# Patient Record
Sex: Male | Born: 1945
Health system: Southern US, Community
[De-identification: ages and names within clinical notes are randomized; demographics above are authoritative.]

## PROBLEM LIST (undated history)

## (undated) DIAGNOSIS — M1990 Unspecified osteoarthritis, unspecified site: Secondary | ICD-10-CM

## (undated) DIAGNOSIS — N189 Chronic kidney disease, unspecified: Secondary | ICD-10-CM

## (undated) DIAGNOSIS — M5410 Radiculopathy, site unspecified: Secondary | ICD-10-CM

## (undated) DIAGNOSIS — E785 Hyperlipidemia, unspecified: Secondary | ICD-10-CM

## (undated) DIAGNOSIS — Z87442 Personal history of urinary calculi: Secondary | ICD-10-CM

## (undated) DIAGNOSIS — K219 Gastro-esophageal reflux disease without esophagitis: Secondary | ICD-10-CM

## (undated) DIAGNOSIS — R7303 Prediabetes: Secondary | ICD-10-CM

## (undated) DIAGNOSIS — N529 Male erectile dysfunction, unspecified: Secondary | ICD-10-CM

## (undated) DIAGNOSIS — I499 Cardiac arrhythmia, unspecified: Secondary | ICD-10-CM

## (undated) DIAGNOSIS — E559 Vitamin D deficiency, unspecified: Secondary | ICD-10-CM

## (undated) DIAGNOSIS — I10 Essential (primary) hypertension: Secondary | ICD-10-CM

## (undated) DIAGNOSIS — I5189 Other ill-defined heart diseases: Secondary | ICD-10-CM

## (undated) HISTORY — PX: COLONOSCOPY: SHX174

## (undated) HISTORY — PX: UPPER GASTROINTESTINAL ENDOSCOPY: SHX188

## (undated) HISTORY — DX: Gastro-esophageal reflux disease without esophagitis: K21.9

## (undated) HISTORY — DX: Chronic kidney disease, unspecified: N18.9

## (undated) HISTORY — DX: Male erectile dysfunction, unspecified: N52.9

## (undated) HISTORY — DX: Vitamin D deficiency, unspecified: E55.9

## (undated) HISTORY — DX: Hyperlipidemia, unspecified: E78.5

## (undated) HISTORY — DX: Essential (primary) hypertension: I10

---
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
## 1998-11-21 ENCOUNTER — Emergency Department (HOSPITAL_COMMUNITY): Admission: EM | Admit: 1998-11-21 | Discharge: 1998-11-21 | Payer: Self-pay | Admitting: Emergency Medicine

## 1998-11-21 ENCOUNTER — Encounter: Payer: Self-pay | Admitting: Emergency Medicine

## 2000-01-31 ENCOUNTER — Encounter: Payer: Self-pay | Admitting: Gastroenterology

## 2006-03-22 ENCOUNTER — Ambulatory Visit: Payer: Self-pay | Admitting: Gastroenterology

## 2006-04-07 ENCOUNTER — Encounter (INDEPENDENT_AMBULATORY_CARE_PROVIDER_SITE_OTHER): Payer: Self-pay | Admitting: *Deleted

## 2006-04-07 ENCOUNTER — Ambulatory Visit: Payer: Self-pay | Admitting: Gastroenterology

## 2009-08-21 ENCOUNTER — Emergency Department (HOSPITAL_COMMUNITY): Admission: EM | Admit: 2009-08-21 | Discharge: 2009-08-22 | Payer: Self-pay | Admitting: Emergency Medicine

## 2009-08-22 ENCOUNTER — Encounter (INDEPENDENT_AMBULATORY_CARE_PROVIDER_SITE_OTHER): Payer: Self-pay | Admitting: *Deleted

## 2009-08-25 ENCOUNTER — Encounter: Admission: RE | Admit: 2009-08-25 | Discharge: 2009-08-25 | Payer: Self-pay | Admitting: General Surgery

## 2009-08-25 ENCOUNTER — Encounter (INDEPENDENT_AMBULATORY_CARE_PROVIDER_SITE_OTHER): Payer: Self-pay | Admitting: *Deleted

## 2009-08-25 ENCOUNTER — Encounter: Payer: Self-pay | Admitting: Gastroenterology

## 2009-08-27 ENCOUNTER — Telehealth: Payer: Self-pay | Admitting: Gastroenterology

## 2009-08-28 ENCOUNTER — Ambulatory Visit: Payer: Self-pay | Admitting: Internal Medicine

## 2009-08-28 ENCOUNTER — Encounter: Payer: Self-pay | Admitting: Internal Medicine

## 2009-08-28 DIAGNOSIS — Z86010 Personal history of colonic polyps: Secondary | ICD-10-CM

## 2009-08-31 ENCOUNTER — Encounter: Payer: Self-pay | Admitting: Internal Medicine

## 2009-08-31 ENCOUNTER — Ambulatory Visit: Payer: Self-pay | Admitting: Internal Medicine

## 2009-08-31 ENCOUNTER — Ambulatory Visit (HOSPITAL_COMMUNITY): Admission: RE | Admit: 2009-08-31 | Discharge: 2009-08-31 | Payer: Self-pay | Admitting: Internal Medicine

## 2009-09-01 ENCOUNTER — Encounter: Payer: Self-pay | Admitting: Internal Medicine

## 2009-09-01 ENCOUNTER — Ambulatory Visit (HOSPITAL_COMMUNITY): Admission: RE | Admit: 2009-09-01 | Discharge: 2009-09-01 | Payer: Self-pay | Admitting: Internal Medicine

## 2009-09-02 ENCOUNTER — Telehealth: Payer: Self-pay | Admitting: Internal Medicine

## 2009-09-17 ENCOUNTER — Ambulatory Visit (HOSPITAL_COMMUNITY): Admission: RE | Admit: 2009-09-17 | Discharge: 2009-09-17 | Payer: Self-pay | Admitting: Internal Medicine

## 2009-09-30 ENCOUNTER — Encounter: Payer: Self-pay | Admitting: Gastroenterology

## 2009-10-06 ENCOUNTER — Encounter (INDEPENDENT_AMBULATORY_CARE_PROVIDER_SITE_OTHER): Payer: Self-pay | Admitting: General Surgery

## 2009-10-06 ENCOUNTER — Ambulatory Visit (HOSPITAL_COMMUNITY): Admission: RE | Admit: 2009-10-06 | Discharge: 2009-10-07 | Payer: Self-pay | Admitting: General Surgery

## 2009-10-09 ENCOUNTER — Encounter: Admission: RE | Admit: 2009-10-09 | Discharge: 2009-10-09 | Payer: Self-pay | Admitting: General Surgery

## 2010-01-03 HISTORY — PX: HAMMER TOE SURGERY: SHX385

## 2010-01-24 ENCOUNTER — Encounter: Payer: Self-pay | Admitting: Internal Medicine

## 2010-02-02 NOTE — Progress Notes (Signed)
Summary: results request  Phone Note Call from Patient Call back at [phone]  or  [phone]   Caller: Larita Fife, wife Call For: Dr. Juanda Chance  Reason for Call: Lab or Test Results Summary of Call: would like EGD and MRI results Initial call taken by: Vallarie Mare,  September 02, 2009 11:09 AM  Follow-up for Phone Call        Left message for patient to call back Darcey Nora RN, [facility]  September 02, 2009 11:18 AM  reviewed with patient's wife.  See MRI append. Darcey Nora RN, [facility]  September 02, 2009 12:03 PM

## 2010-02-02 NOTE — Procedures (Signed)
Summary: Upper Endoscopy  Patient: Jeffrey Carey Note: All result statuses are Final unless otherwise noted.  Tests: (1) Upper Endoscopy (EGD)   EGD Upper Endoscopy       DONE     [facility]     [address] [place], [place]  [address]           ENDOSCOPY PROCEDURE REPORT           PATIENT:  Jeffrey Carey, Jeffrey Carey  MR#:  [id number]     BIRTHDATE:  [birth year], 64 yrs. old  GENDER:  male           ENDOSCOPIST:  Hedwig Morton. Juanda Chance, MD     Referred by:  Lucky Cowboy, M.D.           PROCEDURE DATE:  08/31/2009     PROCEDURE:  EGD with biopsy     ASA CLASS:  Class II     INDICATIONS:  abdominal pain bloating and dyspepsia x several     weeks, started suddenly after a meal,     cholelithioasis on sono     normal LFT's, small liver lesions on CT scan 08/25/2009,scheduled     for MRI     weight loss 10 lbs and loss of apetite           MEDICATIONS:   Versed 4 mg, Fentanyl 50 mcg     TOPICAL ANESTHETIC:  Cetacaine Spray           DESCRIPTION OF PROCEDURE:   After the risks benefits and     alternatives of the procedure were thoroughly explained, informed     consent was obtained.  The  endoscope was introduced through the     mouth and advanced to the second portion of the duodenum, without     limitations.  The instrument was slowly withdrawn as the mucosa     was fully examined.     <<PROCEDUREIMAGES>>           The upper, middle, and distal third of the esophagus were     carefully inspected and no abnormalities were noted. The z-line     was well seen at the GEJ. The endoscope was pushed into the fundus     which was normal including a retroflexed view. The antrum,gastric     body, first and second part of the duodenum were unremarkable. 1     cm reducible hiatal hernia With standard forceps, a biopsy was     obtained and sent to pathology. gastric biopsy to r/o H (see     image1, image2, image3, image4, image5, and image6).Pylori     Retroflexed views revealed no  abnormalities.    The scope was then     withdrawn from the patient and the procedure completed.           COMPLICATIONS:  None           ENDOSCOPIC IMPRESSION:     1) Normal EGD     s/p gastric biopsies to r/o H.Pylori, nothing to account for     pt's symptoms     RECOMMENDATIONS:     1) Await biopsy results     MRI of the liver scheduled     consider HIDA scan to assess gall bladder function     continue PPI bid     low fat diet     follow up with Dr Arlyce Dice in 2 weeks  REPEAT EXAM:  In 0 year(s) for.           ______________________________     Hedwig Morton. Juanda Chance, MD           CC:           n.     eSIGNED:   Hedwig Morton. Allysson Rinehimer at 08/31/2009 09:39 AM           Elliot Cousin, [id number]  Note: An exclamation mark (!) indicates a result that was not dispersed into the flowsheet. Document Creation Date: 08/31/2009 9:40 AM _______________________________________________________________________  (1) Order result status: Final Collection or observation date-time: 08/31/2009 09:30 Requested date-time:  Receipt date-time:  Reported date-time:  Referring Physician:   Ordering Physician: Lina Sar [phone]) Specimen Source:  Source: Launa Grill Order Number: [id number] Lab site:

## 2010-02-02 NOTE — Letter (Signed)
Summary: [facility]   Imported By: Lester [place] 10/09/2009 09:32:19  _____________________________________________________________________  External Attachment:    Type:   Image     Comment:   External Document

## 2010-02-02 NOTE — Progress Notes (Signed)
Summary: triage  Phone Note From Other Clinic Call back at [phone]   Caller: Elease Hashimoto, assistant Call For: Dr. Arlyce Dice Reason for Call: Schedule Patient Appt Summary of Call: Dr. Emelia Loron would like pt seen asap for diarrhea and abd bloating Initial call taken by: Vallarie Mare,  August 27, 2009 8:41 AM  Follow-up for Phone Call        Pt. will see [facility] on 08-28-09 at 1:30pm. Elease Hashimoto will advise pt. of appt/med.list/co-pay/cx.policy. She will fax records to [facility]. Follow-up by: Laureen Ochs LPN,  August 27, 2009 9:05 AM

## 2010-02-02 NOTE — Letter (Signed)
Summary: [facility]   Imported By: Lester  09/15/2009 09:48:12  _____________________________________________________________________  External Attachment:    Type:   Image     Comment:   External Document

## 2010-02-02 NOTE — Letter (Signed)
Summary: EGD Instructions  [facility]  [address] [place], [place] [address]   Phone: [phone]  Fax: [phone]       Jeffrey Carey    May 10, 1945    MRN: [id number]       Procedure Day /Date:08-31-09     Arrival Time: 8:30 AM      Procedure Time: 9:30 AM     Location of Procedure:                     X     [facility] ( Outpatient Registration)  PREPARATION FOR ENDOSCOPY   On 8-29-11THE DAY OF THE PROCEDURE:  1.   No solid foods, milk or milk products are allowed after midnight the night before your procedure.  2.   Do not drink anything colored red or purple.  Avoid juices with pulp.  No orange juice.  3.  You may drink clear liquids until  5:30 AM , which is 4  hours before your procedure.                                                                                                CLEAR LIQUIDS INCLUDE: Water Jello Ice Popsicles Tea (sugar ok, no milk/cream) Powdered fruit flavored drinks Coffee (sugar ok, no milk/cream) Gatorade Juice: apple, white grape, white cranberry  Lemonade Clear bullion, consomm, broth Carbonated beverages (any kind) Strained chicken noodle soup Hard Candy   MEDICATION INSTRUCTIONS  Unless otherwise instructed, you should take regular prescription medications with a small sip of water as early as possible the morning of your procedure.        OTHER INSTRUCTIONS  You will need a responsible adult at least 64 years of age to accompany you and drive you home.   This person must remain in the waiting room during your procedure.  Wear loose fitting clothing that is easily removed.  Leave jewelry and other valuables at home.  However, you may wish to bring a book to read or an iPod/MP3 player to listen to music as you wait for your procedure to start.  Remove all body piercing jewelry and leave at home.  Total time from sign-in until discharge is approximately 2-3 hours.  You should go home directly  after your procedure and rest.  You can resume normal activities the day after your procedure.  The day of your procedure you should not:   Drive   Make legal decisions   Operate machinery   Drink alcohol   Return to work  You will receive specific instructions about eating, activities and medications before you leave.    The above instructions have been reviewed and explained to me by   _______________________    I fully understand and can verbalize these instructions _____________________________ Date _________

## 2010-02-02 NOTE — Letter (Signed)
Summary: Patient Notice-Endo Biopsy Results  [facility]  [address] [place], [place] [address]   Phone: [phone]  Fax: [phone]        September 01, 2009 MRN: [id number]    Jeffrey Carey [address] [place], [place]  [address]    Dear Mr. DETTER,  I am pleased to inform you that the biopsies taken during your recent endoscopic examination did not show any evidence of cancer upon pathologic examination.The biopsies show normal tissue  Additional information/recommendations:  __No further action is needed at this time.  Please follow-up with      your primary care physician for your other healthcare needs.  _x_ Please call [phone] to schedule a return visit to review      your condition to follow up with Dr Arlyce Dice  _x_ Continue with the treatment plan as outlined on the day of your      exam.The MRI showed  the liver lesions to be simple cysts . Also gall stones showed again in Your gall bladder.    Please call us if you are having persistent problems or have questions about your condition that have not been fully answered at this time.  Sincerely,  Hart Carwin MD  This letter has been electronically signed by your physician.  Appended Document: Patient Notice-Endo Biopsy Results letter mailed to patient's home

## 2010-02-02 NOTE — Procedures (Signed)
Summary: Colonoscopy   Colonoscopy  Procedure date:  04/07/2006  Findings:      Location:  [facility].     Patient Name: Jeffrey Carey, Jeffrey Carey MRN:  Procedure Procedures: Colonoscopy CPT: [address].    with Hot Biopsy(s)CPT: [id number].    with polypectomy. CPT: A3573898.  Personnel: Endoscopist: Barbette Hair. Arlyce Dice, MD.  Patient Consent: Procedure, Alternatives, Risks and Benefits discussed, consent obtained, from patient.  Indications  Average Risk Screening Routine.  History  Current Medications: Patient is not currently taking Coumadin.  Pre-Exam Physical: Performed Apr 07, 2006. Cardio-pulmonary exam, HEENT exam , Abdominal exam, Mental status exam WNL.  Comments: Patient history reviewed/updated, physical performed prior to initiation of sedation?yes Exam Exam: Extent of exam reached: Cecum, extent intended: Cecum.  The cecum was identified by appendiceal orifice and IC valve. Time to Cecum: 00:01: 40. Time for Withdrawl: 00:07:58. Colon retroflexion performed. ASA Classification: I. Tolerance: good.  Monitoring: Pulse and BP monitoring, Oximetry used. Supplemental O2 given. at 2 Liters.  Colon Prep Used Miralax for colon prep. Prep results: good.  Sedation Meds: Patient assessed and found to be appropriate for moderate (conscious) sedation. Sedation was managed by the Endoscopist. Fentanyl 50 mcg. given IV. Versed 6 mg. given IV.  Findings - NORMAL EXAM: Ascending Colon to Sigmoid Colon.  POLYP: Ascending Colon, Maximum size: 7 mm. sessile polyp. Procedure:  snare with cautery, Polyp sent to pathology. ICD9: Colon Polyps: 211.3.  NORMAL EXAM: Cecum.  - MULTIPLE POLYPS: Sigmoid Colon. minimum size 1 mm, maximum size 2 mm. Procedure:  hot biopsy, 1 polyps Comments: 5 discreet diminutive polyps at 12-13cm from anus, most likely hyperplastic.  NORMAL EXAM: Rectum.   Assessment Abnormal examination, see findings above.  Diagnoses: 211.3: Colon Polyps.    Events  Unplanned Interventions: No intervention was required.  Unplanned Events: There were no complications. Plans  Post Exam Instructions: Post sedation instructions given.  Patient Education: Patient given standard instructions for: Polyps.  Disposition: After procedure patient sent to recovery. After recovery patient sent home.  Scheduling/Referral: Colonoscopy, to Barbette Hair. Arlyce Dice, MD, around Apr 07, 2011.    CC: [name]     The Patient   This report was created from the original endoscopy report, which was reviewed and signed by the above listed endoscopist.

## 2010-02-02 NOTE — Assessment & Plan Note (Signed)
Summary: DIARRHEA, PAIN,BLOATING    (DR.KAPLAN PT.)   DEBORAH   History of Present Illness Visit Type: Follow-up Visit Primary GI MD: Melvia Heaps MD [facility] Primary Provider: Karle Plumber Requesting Provider: n/a Chief Complaint: Bloating and abdominal discomfort History of Present Illness:   PLEASANT 65 Y.O MALE KNOWN TO DR. KAPLAN FROM PREVIOUS COLONOSCOPIES.LAST SEEN 2008/ADENOMATOUS POLYPS. HE IS REFERRED BACK TODAY FOR EVALUATION OF 2 WEEK HX OF UPPER ABDOMINAL PRESSURE AND BLOATING WHICH IS MAKING HIM MISERABLE. HE HAS NO APPETITE AND HAS LOST 10 POUNDS OVER THE PAST COUPLE WEEKS.NO NAUSEA OR VOMITING,FEELS WORSE AFTER EATING. NO FEVER. NO HEARTBURN,DYSPHAGIA. BM'S DECREASED SINCE NOT EATING, NO MELENA OR HEME. HE STARTED PRILOSEC TWICE DAILY LAST WEEK-NO CHANGE SO FAR. HE RELATES A SIMILAR FEELING ABOUT 10 YEARS AGO,WAS TREATED FOR A BACTERIA IN HIS STOMACH AND  SXS RESOVED. NO ASA OR NSAID USE.  LABS 8/20  UNREMARKABLE CBC,CMET,LIPASE. HE HAD A CT ABD/PELVIS 8/23  WHICH SHOWS SMALL INDETERMINATE LIVER LESIONS,LARGEST 1.5 CM,MILDLY ENARLGED PROSTATE. ABDOMINAL US 8/20 SHOWS MULTIPLE GALLSTONES. HE SAW DR WAKEFIELD FOR SURGICAL CONSULT EARLIER THIS WEEK,AND WAS TOLD SXS NOT DUE TO GB.  PT IS UNABLE TO SLEEP BECAUSE HE FEELS WORSE WHEN LAYING DOWN, "FEELS PRESSURE PUSHING UP",HAS BEEN TRYING TO SLEEP IN A RECLINER.   GI Review of Systems    Reports abdominal pain, belching, and  bloating.      Denies acid reflux, chest pain, dysphagia with liquids, dysphagia with solids, heartburn, loss of appetite, nausea, vomiting, vomiting blood, weight loss, and  weight gain.      Reports diarrhea.     Denies anal fissure, black tarry stools, change in bowel habit, constipation, diverticulosis, fecal incontinence, heme positive stool, hemorrhoids, irritable bowel syndrome, jaundice, light color stool, liver problems, rectal bleeding, and  rectal pain. Preventive Screening-Counseling &  Management  Alcohol-Tobacco     Smoking Status: never      Drug Use:  no.      Current Medications (verified): 1)  Prilosec 40 Mg Cpdr (Omeprazole) .Marland Kitchen.. 1 By Mouth Two Times A Day 2)  Gas Relief 80 Mg Chew (Simethicone) .... As Needed  Allergies (verified): No Known Drug Allergies  Past History:  Past Medical History: Hyperlipidemia Hypertension Tubular Adenoma colon 2008  Past Surgical History: foot surgery  Family History: Family History of Colon Cancer:mother, father Family History of Diabetes: father  Social History: Patient has never smoked.  Alcohol Use - no Daily Caffeine Use  sodas and coffee Illicit Drug Use - no Smoking Status:  never Drug Use:  no  Review of Systems       The patient complains of shortness of breath and sleeping problems.  The patient denies allergy/sinus, anemia, anxiety-new, arthritis/joint pain, back pain, blood in urine, breast changes/lumps, change in vision, confusion, cough, coughing up blood, depression-new, fainting, fatigue, fever, headaches-new, hearing problems, heart murmur, heart rhythm changes, itching, muscle pains/cramps, night sweats, nosebleeds, skin rash, sore throat, swelling of feet/legs, swollen lymph glands, thirst - excessive, urination - excessive, urination changes/pain, urine leakage, vision changes, and voice change.         SEE HPI  Vital Signs:  Patient profile:   65 year old male Height:      71 inches Weight:      204 pounds BMI:     28.56 BSA:     2.13 Pulse rate:   72 / minute Pulse rhythm:   regular BP sitting:   118 / 80  (left arm)  Vitals Entered By: Merri Ray CMA Duncan Dull) (August 28, 2009 1:18 PM)  Physical Exam  General:  Well developed, well nourished, no acute distress. Head:  Normocephalic and atraumatic. Eyes:  PERRLA, no icterus. Neck:  Supple; no masses or thyromegaly. Lungs:  Clear throughout to auscultation. Heart:  Regular rate and rhythm; no murmurs, rubs,  or  bruits. Abdomen:  SOFT, BS+,NO SPLASH, NO PALP MASS OR HSM, MINIMAL DISCOMFORT EPIGASTRIUM Rectal:  HEME NEGATIVE Extremities:  No clubbing, cyanosis, edema or deformities noted. Neurologic:  Alert and  oriented x4;  grossly normal neurologically. Psych:  Alert and cooperative. Normal mood and affect.   Impression & Recommendations:  Problem # 1:  ABDOMINAL PAIN, EPIGASTRIC (ICD-789.06) Assessment New 64 YO MALE WITH 2 WEEK HX OF EPIGASTRIC DISCOMFORT, BLOATING,PRESSURE WITH ANOREXIA AND 10 POUND WEIGHT LOSS. CHOLELITHIASIS ON [place] BUT SURGEON DID NOT FEEL  SXS CONSISTENT WITH GB DISEASE. INDETERMINATE LIVER LESIONS ON CT -WILL NEED FURTHER WORKUP.  R/O GASTRIC LESION/NEOPLASM ,PUD,HPYLORI.    CONTINUE TWICE DAILY PRILOSEC FOR NOW SCHEDULE FOR UPPER ENDOSCOPY NEXT WEEK WITH DR. Juanda Chance TO EXPEDITE WORKUP. SCHEDULE FOR MRI OF LIVER  SMALL FREQUENT MEALS.   Orders: ZEGD (ZEGD)  Problem # 2:  FAMILY HX COLON CANCER (ICD-V16.0) Assessment: Comment Only  Problem # 3:  PERSONAL HX COLONIC POLYPS (ICD-V12.72) Assessment: Comment Only UP TO DATE,LAST COLON 2008,DUE FOR FOLLOW UP 2013  Patient Instructions: 1)  We have scheduled the Endoscopy at [facility] with Dr. Stormy Card 8/29. 2)  Directions and brochure provided. 3)  We have scheduled the MRI of the Liver on 09-01-09 .  4)  Take Prilosec twice daily, 40 min prior to breakfast and dinner.  Do not take the Prilosec  the morning of the procedure. 5)  Copy: Lucky Cowboy. 6)  The medication list was reviewed and reconciled.  All changed / newly prescribed medications were explained.  A complete medication list was provided to the patient / caregiver.

## 2010-02-02 NOTE — Procedures (Signed)
Summary: Colonoscopy   Colonoscopy  Procedure date:  01/31/2000  Findings:      Location:  [facility].     Patient Name: Jeffrey Carey, Jeffrey Carey MRN:  Procedure Procedures: Colorectal cancer screening, average risk CPT: G0121.  Personnel: Endoscopist: Barbette Hair. Arlyce Dice, MD.  Referred By: Marinus Maw, MD.  Exam Location: Exam performed in Outpatient Clinic. Outpatient  Patient Consent: Procedure, Alternatives, Risks and Benefits discussed, consent obtained, from patient.  Indications  Average Risk Screening Routine.  History  Pre-Exam Physical: Performed Jan 31, 2000. Cardio-pulmonary exam, Rectal exam, HEENT exam , Abdominal exam, Extremity exam WNL.  Exam Exam: Extent of exam reached: Transverse Colon, extent intended: Descending Colon.  ASA Classification: I.  Monitoring: Pulse and BP monitoring, Oximetry used. Supplemental O2 given.  Colon Prep Used Fleets enema for colon prep. Prep results: good.  Sedation Meds: Fentanyl Versed  Findings - NORMAL EXAM: Transverse Colon to Rectum.   Assessment Normal examination.  Events  Unplanned Interventions: No intervention was required.  Unplanned Events: There were no complications. Plans Disposition: After procedure patient sent to recovery. After recovery patient sent home.  Scheduling/Referral: Colonoscopy, to Barbette Hair. Arlyce Dice, MD, around Jan 30, 2005.   Comments: t/c full screening colonoscopy in 5 years   CC: Chrissie Noa D.McKeown,MD     This report was created from the original endoscopy report, which was reviewed and signed by the above listed endoscopist.

## 2010-02-02 NOTE — Miscellaneous (Signed)
Summary: MRI order  Clinical Lists Changes  Problems: Added new problem of GASTROINTESTINAL XRAY, ABNORMAL (ICD-793.4) Orders: Added new Referral order of MRI Liver (MRI Liver) - Signed

## 2010-02-02 NOTE — Procedures (Signed)
Summary: Instruction for procedure/[place]  Instruction for procedure/[place]   Imported By: Sherian Rein 09/02/2009 10:39:48  _____________________________________________________________________  External Attachment:    Type:   Image     Comment:   External Document

## 2010-02-03 ENCOUNTER — Encounter: Payer: Self-pay | Admitting: Internal Medicine

## 2010-03-04 LAB — HM COLONOSCOPY

## 2010-03-18 LAB — SURGICAL PCR SCREEN: MRSA, PCR: NEGATIVE

## 2010-03-18 LAB — CBC
HCT: 49.9 % (ref 39.0–52.0)
Hemoglobin: 16.6 g/dL (ref 13.0–17.0)
MCH: 29.3 pg (ref 26.0–34.0)
MCV: 88 fL (ref 78.0–100.0)
RBC: 5.67 MIL/uL (ref 4.22–5.81)
WBC: 9.1 10*3/uL (ref 4.0–10.5)

## 2010-03-18 LAB — DIFFERENTIAL
Basophils Absolute: 0 10*3/uL (ref 0.0–0.1)
Basophils Relative: 0 % (ref 0–1)
Eosinophils Absolute: 0.2 10*3/uL (ref 0.0–0.7)
Eosinophils Relative: 2 % (ref 0–5)
Lymphs Abs: 1.3 10*3/uL (ref 0.7–4.0)
Monocytes Absolute: 0.4 10*3/uL (ref 0.1–1.0)
Monocytes Relative: 4 % (ref 3–12)

## 2010-03-18 LAB — COMPREHENSIVE METABOLIC PANEL
Albumin: 3.7 g/dL (ref 3.5–5.2)
Calcium: 9.4 mg/dL (ref 8.4–10.5)
Chloride: 104 mEq/L (ref 96–112)
Creatinine, Ser: 1.35 mg/dL (ref 0.4–1.5)
GFR calc Af Amer: >60 mL/min (ref >60)
GFR calc non Af Amer: 53 mL/min — ABNORMAL LOW (ref >60)
Potassium: 4.9 mEq/L (ref 3.5–5.1)
Total Bilirubin: 0.6 mg/dL (ref 0.3–1.2)

## 2010-03-19 LAB — DIFFERENTIAL
Basophils Absolute: 0 10*3/uL (ref 0.0–0.1)
Eosinophils Absolute: 0.2 10*3/uL (ref 0.0–0.7)
Lymphocytes Relative: 17 % (ref 12–46)
Lymphs Abs: 1.6 10*3/uL (ref 0.7–4.0)
Monocytes Absolute: 0.7 10*3/uL (ref 0.1–1.0)
Neutro Abs: 6.6 10*3/uL (ref 1.7–7.7)

## 2010-03-19 LAB — COMPREHENSIVE METABOLIC PANEL
ALT: 19 U/L (ref 0–53)
AST: 22 U/L (ref 0–37)
CO2: 26 mEq/L (ref 19–32)
Glucose, Bld: 84 mg/dL (ref 70–99)
Total Bilirubin: 1.2 mg/dL (ref 0.3–1.2)
Total Protein: 6.7 g/dL (ref 6.0–8.3)

## 2010-03-19 LAB — CK TOTAL AND CKMB (NOT AT ARMC)
CK, MB: 6 ng/mL — ABNORMAL HIGH (ref 0.3–4.0)
Total CK: 163 U/L (ref 7–232)

## 2010-03-19 LAB — CBC
MCV: 90.6 fL (ref 78.0–100.0)
RDW: 14.8 % (ref 11.5–15.5)

## 2010-10-04 HISTORY — PX: LAPAROSCOPIC CHOLECYSTECTOMY: SUR755

## 2010-11-22 ENCOUNTER — Ambulatory Visit (HOSPITAL_COMMUNITY)
Admission: RE | Admit: 2010-11-22 | Discharge: 2010-11-22 | Disposition: A | Discharge status: Home / Self Care | Payer: 59 | Source: Ambulatory Visit | Attending: Internal Medicine | Admitting: Internal Medicine

## 2010-11-22 ENCOUNTER — Other Ambulatory Visit (HOSPITAL_COMMUNITY): Payer: Self-pay | Admitting: Internal Medicine

## 2010-11-22 DIAGNOSIS — N200 Calculus of kidney: Secondary | ICD-10-CM | POA: Insufficient documentation

## 2010-11-22 DIAGNOSIS — R1011 Right upper quadrant pain: Secondary | ICD-10-CM

## 2010-11-23 ENCOUNTER — Other Ambulatory Visit: Payer: Self-pay | Admitting: Internal Medicine

## 2010-11-23 DIAGNOSIS — R1011 Right upper quadrant pain: Secondary | ICD-10-CM

## 2010-11-24 ENCOUNTER — Ambulatory Visit
Admission: RE | Admit: 2010-11-24 | Discharge: 2010-11-24 | Disposition: A | Discharge status: Home / Self Care | Payer: 59 | Source: Ambulatory Visit | Attending: Internal Medicine | Admitting: Internal Medicine

## 2010-11-24 DIAGNOSIS — R1011 Right upper quadrant pain: Secondary | ICD-10-CM

## 2011-03-02 ENCOUNTER — Encounter: Payer: Self-pay | Admitting: Gastroenterology

## 2011-03-11 ENCOUNTER — Ambulatory Visit (AMBULATORY_SURGERY_CENTER): Payer: 59 | Admitting: *Deleted

## 2011-03-11 ENCOUNTER — Encounter: Payer: Self-pay | Admitting: Gastroenterology

## 2011-03-11 VITALS — Ht 71.0 in | Wt 206.0 lb

## 2011-03-11 DIAGNOSIS — Z1211 Encounter for screening for malignant neoplasm of colon: Secondary | ICD-10-CM

## 2011-03-11 MED ORDER — PEG-KCL-NACL-NASULF-NA ASC-C 100 G PO SOLR: ORAL | Status: DC

## 2011-03-25 ENCOUNTER — Ambulatory Visit (AMBULATORY_SURGERY_CENTER): Payer: 59 | Admitting: Gastroenterology

## 2011-03-25 ENCOUNTER — Encounter: Payer: Self-pay | Admitting: Gastroenterology

## 2011-03-25 VITALS — BP 136/109 | HR 64 | Temp 98.1°F | Resp 20 | Ht 71.0 in | Wt 206.0 lb

## 2011-03-25 DIAGNOSIS — D126 Benign neoplasm of colon, unspecified: Secondary | ICD-10-CM

## 2011-03-25 DIAGNOSIS — Z860100 Personal history of colon polyps, unspecified: Secondary | ICD-10-CM

## 2011-03-25 DIAGNOSIS — Z1211 Encounter for screening for malignant neoplasm of colon: Secondary | ICD-10-CM

## 2011-03-25 MED ORDER — SODIUM CHLORIDE 0.9 % IV SOLN: 500.0000 mL | INTRAVENOUS | Status: DC

## 2011-03-25 NOTE — Progress Notes (Signed)
Propofol administered by Paulita Cradle CRNA

## 2011-03-25 NOTE — Progress Notes (Signed)
Patient did not experience any of the following events: a burn prior to discharge; a fall within the facility; wrong site/side/patient/procedure/implant event; or a hospital transfer or hospital admission upon discharge from the facility. (G8907) Patient did not have preoperative order for IV antibiotic SSI prophylaxis. (G8918)  

## 2011-03-25 NOTE — Patient Instructions (Signed)
Discharge instructions given with  Verbal understanding. Handout on polyps given. Resume previous medications.YOU HAD AN ENDOSCOPIC PROCEDURE TODAY AT THE [facility]: Refer to the procedure report that was given to you for any specific questions about what was found during the examination.  If the procedure report does not answer your questions, please call your gastroenterologist to clarify.  If you requested that your care partner not be given the details of your procedure findings, then the procedure report has been included in a sealed envelope for you to review at your convenience later.  YOU SHOULD EXPECT: Some feelings of bloating in the abdomen. Passage of more gas than usual.  Walking can help get rid of the air that was put into your GI tract during the procedure and reduce the bloating. If you had a lower endoscopy (such as a colonoscopy or flexible sigmoidoscopy) you may notice spotting of blood in your stool or on the toilet paper. If you underwent a bowel prep for your procedure, then you may not have a normal bowel movement for a few days.  DIET: Your first meal following the procedure should be a light meal and then it is ok to progress to your normal diet.  A half-sandwich or bowl of soup is an example of a good first meal.  Heavy or fried foods are harder to digest and may make you feel nauseous or bloated.  Likewise meals heavy in dairy and vegetables can cause extra gas to form and this can also increase the bloating.  Drink plenty of fluids but you should avoid alcoholic beverages for 24 hours.  ACTIVITY: Your care partner should take you home directly after the procedure.  You should plan to take it easy, moving slowly for the rest of the day.  You can resume normal activity the day after the procedure however you should NOT DRIVE or use heavy machinery for 24 hours (because of the sedation medicines used during the test).    SYMPTOMS TO REPORT IMMEDIATELY: A  gastroenterologist can be reached at any hour.  During normal business hours, 8:30 AM to 5:00 PM Monday through Friday, call [phone].  After hours and on weekends, please call the GI answering service at [phone] who will take a message and have the physician on call contact you.   Following lower endoscopy (colonoscopy or flexible sigmoidoscopy):  Excessive amounts of blood in the stool  Significant tenderness or worsening of abdominal pains  Swelling of the abdomen that is new, acute  Fever of 100F or higher  FOLLOW UP: If any biopsies were taken you will be contacted by phone or by letter within the next 1-3 weeks.  Call your gastroenterologist if you have not heard about the biopsies in 3 weeks.  Our staff will call the home number listed on your records the next business day following your procedure to check on you and address any questions or concerns that you may have at that time regarding the information given to you following your procedure. This is a courtesy call and so if there is no answer at the home number and we have not heard from you through the emergency physician on call, we will assume that you have returned to your regular daily activities without incident.  SIGNATURES/CONFIDENTIALITY: You and/or your care partner have signed paperwork which will be entered into your electronic medical record.  These signatures attest to the fact that that the information above on your After Visit Summary has  been reviewed and is understood.  Full responsibility of the confidentiality of this discharge information lies with you and/or your care-partner.

## 2011-03-25 NOTE — Op Note (Signed)
[facility] [address] [place], [place]  [address]  COLONOSCOPY PROCEDURE REPORT  PATIENT:  Jeffrey Carey, Jeffrey Carey  MR#:  [id number] BIRTHDATE:  1945-04-03, 65 yrs. old  GENDER:  male ENDOSCOPIST:  Barbette Hair. Arlyce Dice, MD REF. BY:  Lucky Cowboy, M.D. PROCEDURE DATE:  03/25/2011 PROCEDURE:  Colonoscopy with snare polypectomy ASA CLASS:  Class II INDICATIONS:  Screening, history of pre-cancerous (adenomatous) colon polyps Index polypectomy MEDICATIONS:   MAC sedation, administered by CRNA mg IV propofol  DESCRIPTION OF PROCEDURE:   After the risks benefits and alternatives of the procedure were thoroughly explained, informed consent was obtained.  Digital rectal exam was performed and revealed no abnormalities.   The LB 180AL [id number] endoscope was introduced through the anus and advanced to the cecum, which was identified by both the appendix and ileocecal valve, without limitations.  The quality of the prep was excellent, using MoviPrep.  The instrument was then slowly withdrawn as the colon was fully examined. <<PROCEDUREIMAGES>>  FINDINGS:  A sessile polyp was found in the distal transverse colon. It was 2 - 3 mm in size. Polyp was snared without cautery. Retrieval was successful (see image3). snare polyp  Otherwise normal colonoscopy without other polyps, masses, vascular ectasias, or inflammatory changes (see image1, image2, and image4).   Retroflexed views in the rectum revealed no abnormalities.    The time to cecum =  1) 2.50  minutes. The scope was then withdrawn in  1) 8.25  minutes from the cecum and the procedure completed. COMPLICATIONS:  None ENDOSCOPIC IMPRESSION: 1) 2 - 3 mm sessile polyp in the distal transverse colon 2) Otherwise nl colonoscopy WMO RECOMMENDATIONS: 1) If the polyp(s) removed today are proven to be adenomatous (pre-cancerous) polyps, you will need a repeat colonoscopy in 5 years. Otherwise you should continue to follow colorectal  cancer screening guidelines for "routine risk" patients with colonoscopy in 10 years. You will receive a letter within 1-2 weeks with the results of your biopsy as well as final recommendations. Please call my office if you have not received a letter after 3 weeks. REPEAT EXAM:   You will receive a letter from Dr. Arlyce Dice in 1-2 weeks, after reviewing the final pathology, with followup recommendations.  ______________________________ Barbette Hair Arlyce Dice, MD  CC:  n. eSIGNED:   Barbette Hair. Timofey Carandang at 03/25/2011 08:22 AM  Elliot Cousin, [phone]

## 2011-03-28 ENCOUNTER — Telehealth: Payer: Self-pay | Admitting: *Deleted

## 2011-03-28 NOTE — Telephone Encounter (Signed)
  Follow up Call-  Call back number 03/25/2011  Post procedure Call Back phone  # [phone]  Permission to leave phone message Yes     Patient questions:  Do you have a fever, pain , or abdominal swelling? no Pain Score  0 *  Have you tolerated food without any problems? yes  Have you been able to return to your normal activities? yes  Do you have any questions about your discharge instructions: Diet   no Medications  no Follow up visit  no  Do you have questions or concerns about your Care? no  Actions: * If pain score is 4 or above: No action needed, pain <4.

## 2011-03-30 ENCOUNTER — Encounter: Payer: Self-pay | Admitting: Gastroenterology

## 2011-05-25 ENCOUNTER — Other Ambulatory Visit: Payer: Self-pay | Admitting: Dermatology

## 2013-01-07 ENCOUNTER — Encounter: Payer: Self-pay | Admitting: Internal Medicine

## 2013-01-10 ENCOUNTER — Encounter: Payer: Self-pay | Admitting: Internal Medicine

## 2013-01-10 ENCOUNTER — Ambulatory Visit (INDEPENDENT_AMBULATORY_CARE_PROVIDER_SITE_OTHER): Payer: Medicare Other | Admitting: Internal Medicine

## 2013-01-10 VITALS — BP 118/68 | HR 56 | Temp 97.9°F | Resp 16 | Wt 206.2 lb

## 2013-01-10 DIAGNOSIS — E559 Vitamin D deficiency, unspecified: Secondary | ICD-10-CM

## 2013-01-10 DIAGNOSIS — I10 Essential (primary) hypertension: Secondary | ICD-10-CM

## 2013-01-10 DIAGNOSIS — Z79899 Other long term (current) drug therapy: Secondary | ICD-10-CM

## 2013-01-10 DIAGNOSIS — E569 Vitamin deficiency, unspecified: Secondary | ICD-10-CM

## 2013-01-10 DIAGNOSIS — Z0001 Encounter for general adult medical examination with abnormal findings: Secondary | ICD-10-CM

## 2013-01-10 DIAGNOSIS — E782 Mixed hyperlipidemia: Secondary | ICD-10-CM

## 2013-01-10 DIAGNOSIS — R7309 Other abnormal glucose: Secondary | ICD-10-CM

## 2013-01-10 LAB — CBC WITH DIFFERENTIAL/PLATELET
BASOS PCT: 1 % (ref 0–1)
Basophils Absolute: 0 10*3/uL (ref 0.0–0.1)
Eosinophils Absolute: 0.2 10*3/uL (ref 0.0–0.7)
Eosinophils Relative: 3 % (ref 0–5)
HEMATOCRIT: 47.5 % (ref 39.0–52.0)
HEMOGLOBIN: 16.1 g/dL (ref 13.0–17.0)
Lymphocytes Relative: 27 % (ref 12–46)
Lymphs Abs: 1.6 10*3/uL (ref 0.7–4.0)
MCH: 30.3 pg (ref 26.0–34.0)
MCHC: 33.9 g/dL (ref 30.0–36.0)
MCV: 89.3 fL (ref 78.0–100.0)
MONO ABS: 0.4 10*3/uL (ref 0.1–1.0)
MONOS PCT: 7 % (ref 3–12)
NEUTROS ABS: 3.9 10*3/uL (ref 1.7–7.7)
Neutrophils Relative %: 62 % (ref 43–77)
Platelets: 257 10*3/uL (ref 150–400)
RBC: 5.32 MIL/uL (ref 4.22–5.81)
RDW: 13.8 % (ref 11.5–15.5)
WBC: 6.1 10*3/uL (ref 4.0–10.5)

## 2013-01-10 LAB — HEPATIC FUNCTION PANEL
ALK PHOS: 61 U/L (ref 39–117)
ALT: 19 U/L (ref 0–53)
AST: 24 U/L (ref 0–37)
Albumin: 4.3 g/dL (ref 3.5–5.2)
BILIRUBIN INDIRECT: 0.5 mg/dL (ref 0.0–0.9)
Bilirubin, Direct: 0.1 mg/dL (ref 0.0–0.3)
TOTAL PROTEIN: 7 g/dL (ref 6.0–8.3)
Total Bilirubin: 0.6 mg/dL (ref 0.3–1.2)

## 2013-01-10 LAB — BASIC METABOLIC PANEL WITH GFR
BUN: 19 mg/dL (ref 6–23)
CO2: 30 meq/L (ref 19–32)
Calcium: 9.3 mg/dL (ref 8.4–10.5)
Chloride: 101 mEq/L (ref 96–112)
Creat: 1.3 mg/dL (ref 0.50–1.35)
GFR, EST AFRICAN AMERICAN: 66 mL/min
GFR, Est Non African American: 57 mL/min — ABNORMAL LOW
GLUCOSE: 83 mg/dL (ref 70–99)
POTASSIUM: 5.2 meq/L (ref 3.5–5.3)
Sodium: 136 mEq/L (ref 135–145)

## 2013-01-10 LAB — LIPID PANEL
Cholesterol: 141 mg/dL (ref 0–200)
HDL: 42 mg/dL (ref >39)
LDL CALC: 81 mg/dL (ref 0–99)
Total CHOL/HDL Ratio: 3.4 Ratio
Triglycerides: 90 mg/dL (ref <150)
VLDL: 18 mg/dL (ref 0–40)

## 2013-01-10 LAB — HEMOGLOBIN A1C
HEMOGLOBIN A1C: 5.8 % — AB (ref <5.7)
MEAN PLASMA GLUCOSE: 120 mg/dL — AB (ref <117)

## 2013-01-10 LAB — MAGNESIUM: Magnesium: 2.1 mg/dL (ref 1.5–2.5)

## 2013-01-10 MED ORDER — OXYBUTYNIN CHLORIDE 5 MG PO TABS: ORAL_TABLET | ORAL | Status: DC

## 2013-01-10 NOTE — Progress Notes (Signed)
Patient ID: Jeffrey Carey, male   DOB: August 23, 1945, 68 y.o.   MRN: [id number]   This very nice 68 y.o. MWM  presents for 3 month follow up with Hypertension, Hyperlipidemia, Pre-Diabetes and Vitamin D Deficiency.    HTN predates since   . BP has been controlled at home. Today's BP: 118/68 mmHg . Patient denies any cardiac type chest pain, palpitations, dyspnea/orthopnea/PND, dizziness, claudication, or dependent edema.   Hyperlipidemia is controlled with diet & meds. Last Cholesterol was128, Triglycerides were 88, HDL 37 and LDL 73 - all at goal in Oct 2014. Patient denies myalgias or other med SE's.    Also, the patient has history of PreDiabetes and A1c 5.7% since 06/2009 with last A1c of 5.7% in Oct 2014. Patient denies any symptoms of reactive hypoglycemia, diabetic polys, paresthesias or visual blurring.   Further, Patient has history of Vitamin D Deficiency of 24 in 2009 with last vitamin D of 85b in Oct 2014. Patient supplements vitamin D without any suspected side-effects.  Medication Sig Dispense Refill  . aspirin 81 MG tablet Take 81 mg by mouth daily.      Marland Kitchen atenolol (TENORMIN) 100 MG tablet Take 100 mg by mouth daily.       . Cholecalciferol (HM VITAMIN D3) 2000 UNITS CAPS Take by mouth 2 (two) times daily.      . Flaxseed, Linseed, (FLAX SEED OIL PO) Take by mouth.      . Omega-3 Fatty Acids (FISH OIL PO) Take by mouth.      . pravastatin (PRAVACHOL) 40 MG tablet Take 40 mg by mouth daily.       . sildenafil (VIAGRA) 50 MG tablet Take 50 mg by mouth as needed for erectile dysfunction.         Allergies  Allergen Reactions  . Ceftin [Cefuroxime Axetil] Hives    PMHx:   Past Medical History  Diagnosis Date  . Hyperlipidemia   . Hypertension   . GERD (gastroesophageal reflux disease)   . ED (erectile dysfunction)   . Vitamin D deficiency   . Hypogonadism male   . Obesity   . Kidney stones     FHx:    Reviewed / unchanged  SHx:    Reviewed / unchanged  Systems  Review: Constitutional: Denies fever, chills, wt changes, headaches, insomnia, fatigue, night sweats, change in appetite. Eyes: Denies redness, blurred vision, diplopia, discharge, itchy, watery eyes.  ENT: Denies discharge, congestion, post nasal drip, epistaxis, sore throat, earache, hearing loss, dental pain, tinnitus, vertigo, sinus pain, snoring.  CV: Denies chest pain, palpitations, irregular heartbeat, syncope, dyspnea, diaphoresis, orthopnea, PND, claudication, edema. Respiratory: denies cough, dyspnea, DOE, pleurisy, hoarseness, laryngitis, wheezing.  Gastrointestinal: Denies dysphagia, odynophagia, heartburn, reflux, water brash, abdominal pain or cramps, nausea, vomiting, bloating, diarrhea, constipation, hematemesis, melena, hematochezia,  or hemorrhoids. Genitourinary: Denies dysuria, frequency, urgency, nocturia, hesitancy, discharge, hematuria, flank pain. Musculoskeletal: Denies arthralgias, myalgias, stiffness, jt. swelling, pain, limp, strain/sprain.  Skin: Denies pruritus, rash, hives, warts, acne, eczema, change in skin lesion(s). Neuro: No weakness, tremor, incoordination, spasms, paresthesia, or pain. Psychiatric: Denies confusion, memory loss, or sensory loss. Endo: Denies change in weight, skin, hair change.  Heme/Lymph: No excessive bleeding, bruising, orenlarged lymph nodes.  BP: 118/68  Pulse: 56  Temp: 97.9 F (36.6 C)  Resp: 16    Estimated body mass index is 28.77 kg/(m^2) as calculated from the following:   Height as of 03/25/11: 5\' 11"  (1.803 m).   Weight as of this encounter: 206  lb 3.2 oz (93.532 kg).  On Exam: Appears well nourished - in no distress. Eyes: PERRLA, EOMs, conjunctiva no swelling or erythema. Sinuses: No frontal/maxillary tenderness ENT/Mouth: EAC's clear, TM's nl w/o erythema, bulging. Nares clear w/o erythema, swelling, exudates. Oropharynx clear without erythema or exudates. Oral hygiene is good. Tongue normal, non obstructing. Hearing  intact.  Neck: Supple. Thyroid nl. Car 2+/2+ without bruits, nodes or JVD. Chest: Respirations nl with BS clear & equal w/o rales, rhonchi, wheezing or stridor.  Cor: Heart sounds normal w/ regular rate and rhythm without sig. murmurs, gallops, clicks, or rubs. Peripheral pulses normal and equal  without edema.  Abdomen: Soft & bowel sounds normal. Non-tender w/o guarding, rebound, hernias, masses, or organomegaly.  Lymphatics: Unremarkable.  Musculoskeletal: Full ROM all peripheral extremities, joint stability, 5/5 strength, and normal gait.  Skin: Warm, dry without exposed rashes, lesions, ecchymosis apparent.  Neuro: Cranial nerves intact, reflexes equal bilaterally. Sensory-motor testing grossly intact. Tendon reflexes grossly intact.  Pysch: Alert & oriented x 3. Insight and judgement nl & appropriate. No ideations.  Assessment and Plan:  1. Hypertension - Continue monitor blood pressure at home. Continue diet/meds same.  2. Hyperlipidemia - Continue diet/meds, exercise,& lifestyle modifications. Continue monitor periodic cholesterol/liver & renal functions   3. Pre-diabetes - Continue diet, exercise, lifestyle modifications. Monitor appropriate labs.  4. Vitamin D Deficiency - Continue supplementation.  Recommended regular exercise, BP monitoring, weight control, and discussed med and SE's. Recommended labs to assess and monitor clinical status. Further disposition pending results of labs.

## 2013-01-10 NOTE — Patient Instructions (Signed)

## 2013-01-11 LAB — TSH: TSH: 1.569 u[IU]/mL (ref 0.350–4.500)

## 2013-01-11 LAB — INSULIN, FASTING: Insulin fasting, serum: 14 u[IU]/mL (ref 3–28)

## 2013-01-11 LAB — VITAMIN D 25 HYDROXY (VIT D DEFICIENCY, FRACTURES): Vit D, 25-Hydroxy: 85 ng/mL (ref 30–89)

## 2013-04-23 ENCOUNTER — Ambulatory Visit (INDEPENDENT_AMBULATORY_CARE_PROVIDER_SITE_OTHER): Payer: Medicare Other | Admitting: Emergency Medicine

## 2013-04-23 ENCOUNTER — Encounter: Payer: Self-pay | Admitting: Emergency Medicine

## 2013-04-23 VITALS — BP 126/78 | HR 56 | Temp 98.0°F | Resp 18 | Ht 70.0 in | Wt 200.0 lb

## 2013-04-23 DIAGNOSIS — R109 Unspecified abdominal pain: Secondary | ICD-10-CM

## 2013-04-23 DIAGNOSIS — I10 Essential (primary) hypertension: Secondary | ICD-10-CM

## 2013-04-23 DIAGNOSIS — R7309 Other abnormal glucose: Secondary | ICD-10-CM

## 2013-04-23 DIAGNOSIS — E782 Mixed hyperlipidemia: Secondary | ICD-10-CM

## 2013-04-23 LAB — CBC WITH DIFFERENTIAL/PLATELET
BASOS ABS: 0.1 10*3/uL (ref 0.0–0.1)
Basophils Relative: 1 % (ref 0–1)
EOS PCT: 3 % (ref 0–5)
Eosinophils Absolute: 0.2 10*3/uL (ref 0.0–0.7)
HEMATOCRIT: 46.8 % (ref 39.0–52.0)
Hemoglobin: 16.3 g/dL (ref 13.0–17.0)
LYMPHS ABS: 1.7 10*3/uL (ref 0.7–4.0)
LYMPHS PCT: 24 % (ref 12–46)
MCH: 30.1 pg (ref 26.0–34.0)
MCHC: 34.8 g/dL (ref 30.0–36.0)
MCV: 86.3 fL (ref 78.0–100.0)
Monocytes Absolute: 0.4 10*3/uL (ref 0.1–1.0)
Monocytes Relative: 6 % (ref 3–12)
NEUTROS ABS: 4.6 10*3/uL (ref 1.7–7.7)
Neutrophils Relative %: 66 % (ref 43–77)
PLATELETS: 291 10*3/uL (ref 150–400)
RBC: 5.42 MIL/uL (ref 4.22–5.81)
RDW: 14.2 % (ref 11.5–15.5)
WBC: 6.9 10*3/uL (ref 4.0–10.5)

## 2013-04-23 LAB — HEMOGLOBIN A1C
Hgb A1c MFr Bld: 5.6 % (ref <5.7)
MEAN PLASMA GLUCOSE: 114 mg/dL (ref <117)

## 2013-04-23 MED ORDER — PRAVASTATIN SODIUM 40 MG PO TABS: 40.0000 mg | ORAL_TABLET | Freq: Every day | ORAL | Status: DC

## 2013-04-23 MED ORDER — CIPROFLOXACIN HCL 500 MG PO TABS: 500.0000 mg | ORAL_TABLET | Freq: Two times a day (BID) | ORAL | Status: AC

## 2013-04-23 NOTE — Patient Instructions (Signed)
Diet for Gastroesophageal Reflux Disease, Adult should help with colitis also Reflux (acid reflux) is when acid from your stomach flows up into the esophagus. When acid comes in contact with the esophagus, the acid causes irritation and soreness (inflammation) in the esophagus. When reflux happens often or so severely that it causes damage to the esophagus, it is called gastroesophageal reflux disease (GERD). Nutrition therapy can help ease the discomfort of GERD. FOODS OR DRINKS TO AVOID OR LIMIT  Smoking or chewing tobacco. Nicotine is one of the most potent stimulants to acid production in the gastrointestinal tract.  Caffeinated and decaffeinated coffee and black tea.  Regular or low-calorie carbonated beverages or energy drinks (caffeine-free carbonated beverages are allowed).   Strong spices, such as black pepper, white pepper, red pepper, cayenne, curry powder, and chili powder.  Peppermint or spearmint.  Chocolate.  High-fat foods, including meats and fried foods. Extra added fats including oils, butter, salad dressings, and nuts. Limit these to less than 8 tsp per day.  Fruits and vegetables if they are not tolerated, such as citrus fruits or tomatoes.  Alcohol.  Any food that seems to aggravate your condition. If you have questions regarding your diet, call your caregiver or a registered dietitian. OTHER THINGS THAT MAY HELP GERD INCLUDE:   Eating your meals slowly, in a relaxed setting.  Eating 5 to 6 small meals per day instead of 3 large meals.  Eliminating food for a period of time if it causes distress.  Not lying down until 3 hours after eating a meal.  Keeping the head of your bed raised 6 to 9 inches (15 to 23 cm) by using a foam wedge or blocks under the legs of the bed. Lying flat may make symptoms worse.  Being physically active. Weight loss may be helpful in reducing reflux in overweight or obese adults.  Wear loose fitting clothing EXAMPLE MEAL  PLAN This meal plan is approximately 2,000 calories based on [email or web address] meal planning guidelines. Breakfast   cup cooked oatmeal.  1 cup strawberries.  1 cup low-fat milk.  1 oz almonds. Snack  1 cup cucumber slices.  6 oz yogurt (made from low-fat or fat-free milk). Lunch  2 slice whole-wheat bread.  2 oz sliced [place].  2 tsp mayonnaise.  1 cup blueberries.  1 cup snap peas. Snack  6 whole-wheat crackers.  1 oz string cheese. Dinner   cup brown rice.  1 cup mixed veggies.  1 tsp olive oil.  3 oz grilled fish. Document Released: 12/20/2004 Document Revised: 03/14/2011 Document Reviewed: 11/05/2010 [facility] Patient Information 2014 [place], [place]. Colitis Colitis is inflammation of the colon. Colitis can be a short-term or long-standing (chronic) illness. Crohn's disease and ulcerative colitis are 2 types of colitis which are chronic. They usually require lifelong treatment. CAUSES  There are many different causes of colitis, including:  Viruses.  Germs (bacteria).  Medicine reactions. SYMPTOMS   Diarrhea.  Intestinal bleeding.  Pain.  Fever.  Throwing up (vomiting).  Tiredness (fatigue).  Weight loss.  Bowel blockage. DIAGNOSIS  The diagnosis of colitis is based on examination and stool or blood tests. X-rays, CT scan, and colonoscopy may also be needed. TREATMENT  Treatment may include:  Fluids given through the vein (intravenously).  Bowel rest (nothing to eat or drink for a period of time).  Medicine for pain and diarrhea.  Medicines (antibiotics) that kill germs.  Cortisone medicines.  Surgery. HOME CARE INSTRUCTIONS   Get plenty of rest.  Drink enough  water and fluids to keep your urine clear or pale yellow.  Eat a well-balanced diet.  Call your caregiver for follow-up as recommended. SEEK IMMEDIATE MEDICAL CARE IF:   You develop chills.  You have an oral temperature above 102 F (38.9 C), not  controlled by medicine.  You have extreme weakness, fainting, or dehydration.  You have repeated vomiting.  You develop severe belly (abdominal) pain or are passing bloody or tarry stools. MAKE SURE YOU:   Understand these instructions.  Will watch your condition.  Will get help right away if you are not doing well or get worse. Document Released: 01/28/2004 Document Revised: 03/14/2011 Document Reviewed: 04/24/2009 [facility] Patient Information 2014 [place], [place].

## 2013-04-23 NOTE — Progress Notes (Signed)
Subjective:    Patient ID: Jeffrey Carey, male    DOB: [birth year], 68 y.o.   MRN: [id number]  HPI Comments: 68 yo WM presents for 3 month F/U for HTN, Cholesterol, Pre-Dm, D. Deficient. He is staying active. He is eating healthy. He notes BP good at home. He has decreased sweets and increased waters.   Last labs  CHOL         141   01/10/2013 HDL           42   01/10/2013 LDLCALC       81   01/10/2013 TRIG          90   01/10/2013 CHOLHDL      3.4   01/10/2013 ALT           19   01/10/2013 AST           24   01/10/2013 ALKPHOS       61   01/10/2013 BILITOT      0.6   01/10/2013 HGBA1C      5.8   01/10/2013 CREATININE     1.30   01/10/2013 BUN              19   01/10/2013 NA              136   01/10/2013 K               5.2   01/10/2013 CL              101   01/10/2013 CO2              30   01/10/2013  He has been off bladder/ prostate medicine with improved symptoms. He notes over last 2 weeks LLQ pain and diarrhea. He notes symptoms are worse with eating. He has had softer stools since having GB out. Deatra Ina did colonoscopy 3/ 2013 with polyp benign. He denies any blood with BM. No new exposures. No Blue bell ice cream  Hyperlipidemia  Hypertension      Medication List       This list is accurate as of: 04/23/13 10:31 AM.  Always use your most recent med list.               aspirin 81 MG tablet  Take 81 mg by mouth daily.     atenolol 100 MG tablet  Commonly known as:  TENORMIN  Take 100 mg by mouth daily.     FISH OIL PO  Take by mouth.     FLAX SEED OIL PO  Take by mouth.     HM VITAMIN D3 2000 UNITS Caps  Generic drug:  Cholecalciferol  Take by mouth 2 (two) times daily.     pravastatin 40 MG tablet  Commonly known as:  PRAVACHOL  Take 1 tablet (40 mg total) by mouth daily.     sildenafil 50 MG tablet  Commonly known as:  VIAGRA  Take 50 mg by mouth as needed for erectile dysfunction.       Allergies  Allergen Reactions  . Ceftin [Cefuroxime Axetil] Hives   Past Medical  History  Diagnosis Date  . Hyperlipidemia   . Hypertension   . GERD (gastroesophageal reflux disease)   . ED (erectile dysfunction)   . Vitamin D deficiency   . Hypogonadism male   . Obesity   . Kidney stones      Review of Systems  Gastrointestinal: Positive for  abdominal pain.  All other systems reviewed and are negative.  BP 126/78  Pulse 56  Temp(Src) 98 F (36.7 C) (Temporal)  Resp 18  Ht 5\' 10"  (1.778 m)  Wt 200 lb (90.719 kg)  BMI 28.70 kg/m2     Objective:   Physical Exam  Nursing note and vitals reviewed. Constitutional: He is oriented to person, place, and time. He appears well-developed and well-nourished.  HENT:  Head: Normocephalic and atraumatic.  Right Ear: External ear normal.  Left Ear: External ear normal.  Nose: Nose normal.  Eyes: Conjunctivae and EOM are normal.  Neck: Normal range of motion. Neck supple. No JVD present. No thyromegaly present.  Cardiovascular: Normal rate, regular rhythm, normal heart sounds and intact distal pulses.   Pulmonary/Chest: Effort normal and breath sounds normal.  Abdominal: Soft. Bowel sounds are normal. He exhibits no distension. There is tenderness.  Mild LLQ  Musculoskeletal: Normal range of motion. He exhibits no edema and no tenderness.  Lymphadenopathy:    He has no cervical adenopathy.  Neurological: He is alert and oriented to person, place, and time. He has normal reflexes. No cranial nerve deficit. Coordination normal.  Skin: Skin is warm and dry.  Psychiatric: He has a normal mood and affect. His behavior is normal. Judgment and thought content normal.          Assessment & Plan:  1.  3 month F/U for HTN, Cholesterol, Pre-Dm, D. Deficient. Needs healthy diet, cardio QD and obtain healthy weight. Check Labs, Check BP if >130/80 call office   2. LLQ ABdomne pain- ? Colitis- Cipro 500mg  AD, bland diet given, if no improvement REF Deatra Ina

## 2013-04-24 LAB — BASIC METABOLIC PANEL WITH GFR
BUN: 26 mg/dL — AB (ref 6–23)
CHLORIDE: 101 meq/L (ref 96–112)
CO2: 26 mEq/L (ref 19–32)
Calcium: 9.4 mg/dL (ref 8.4–10.5)
Creat: 1.44 mg/dL — ABNORMAL HIGH (ref 0.50–1.35)
GFR, EST NON AFRICAN AMERICAN: 50 mL/min — AB
GFR, Est African American: 58 mL/min — ABNORMAL LOW
GLUCOSE: 90 mg/dL (ref 70–99)
POTASSIUM: 5.5 meq/L — AB (ref 3.5–5.3)
SODIUM: 139 meq/L (ref 135–145)

## 2013-04-24 LAB — HEPATIC FUNCTION PANEL
ALBUMIN: 4.4 g/dL (ref 3.5–5.2)
ALK PHOS: 57 U/L (ref 39–117)
ALT: 18 U/L (ref 0–53)
AST: 19 U/L (ref 0–37)
Bilirubin, Direct: 0.1 mg/dL (ref 0.0–0.3)
Indirect Bilirubin: 0.6 mg/dL (ref 0.2–1.2)
Total Bilirubin: 0.7 mg/dL (ref 0.2–1.2)
Total Protein: 6.9 g/dL (ref 6.0–8.3)

## 2013-04-24 LAB — LIPID PANEL
CHOLESTEROL: 148 mg/dL (ref 0–200)
HDL: 37 mg/dL — ABNORMAL LOW (ref >39)
LDL Cholesterol: 85 mg/dL (ref 0–99)
Total CHOL/HDL Ratio: 4 Ratio
Triglycerides: 131 mg/dL (ref <150)
VLDL: 26 mg/dL (ref 0–40)

## 2013-04-24 LAB — INSULIN, FASTING: Insulin fasting, serum: 19 u[IU]/mL (ref 3–28)

## 2013-05-15 ENCOUNTER — Encounter (HOSPITAL_COMMUNITY): Payer: Self-pay | Admitting: Emergency Medicine

## 2013-05-15 ENCOUNTER — Emergency Department (HOSPITAL_COMMUNITY)
Admission: EM | Admit: 2013-05-15 | Discharge: 2013-05-15 | Disposition: A | Discharge status: Home / Self Care | Payer: Medicare Other | Attending: Emergency Medicine | Admitting: Emergency Medicine

## 2013-05-15 ENCOUNTER — Emergency Department (HOSPITAL_COMMUNITY): Payer: Medicare Other

## 2013-05-15 DIAGNOSIS — E785 Hyperlipidemia, unspecified: Secondary | ICD-10-CM | POA: Insufficient documentation

## 2013-05-15 DIAGNOSIS — E669 Obesity, unspecified: Secondary | ICD-10-CM | POA: Insufficient documentation

## 2013-05-15 DIAGNOSIS — Z8719 Personal history of other diseases of the digestive system: Secondary | ICD-10-CM | POA: Insufficient documentation

## 2013-05-15 DIAGNOSIS — E291 Testicular hypofunction: Secondary | ICD-10-CM | POA: Insufficient documentation

## 2013-05-15 DIAGNOSIS — Z87442 Personal history of urinary calculi: Secondary | ICD-10-CM | POA: Insufficient documentation

## 2013-05-15 DIAGNOSIS — M792 Neuralgia and neuritis, unspecified: Secondary | ICD-10-CM

## 2013-05-15 DIAGNOSIS — I10 Essential (primary) hypertension: Secondary | ICD-10-CM | POA: Insufficient documentation

## 2013-05-15 DIAGNOSIS — Z79899 Other long term (current) drug therapy: Secondary | ICD-10-CM | POA: Insufficient documentation

## 2013-05-15 DIAGNOSIS — E559 Vitamin D deficiency, unspecified: Secondary | ICD-10-CM | POA: Insufficient documentation

## 2013-05-15 DIAGNOSIS — IMO0002 Reserved for concepts with insufficient information to code with codable children: Secondary | ICD-10-CM | POA: Insufficient documentation

## 2013-05-15 DIAGNOSIS — Z7982 Long term (current) use of aspirin: Secondary | ICD-10-CM | POA: Insufficient documentation

## 2013-05-15 DIAGNOSIS — N529 Male erectile dysfunction, unspecified: Secondary | ICD-10-CM | POA: Insufficient documentation

## 2013-05-15 MED ORDER — HYDROCODONE-ACETAMINOPHEN 5-325 MG PO TABS: 1.0000 | ORAL_TABLET | Freq: Four times a day (QID) | ORAL | Status: DC | PRN

## 2013-05-15 MED ORDER — HYDROCODONE-ACETAMINOPHEN 5-325 MG PO TABS
1.0000 | ORAL_TABLET | Freq: Once | ORAL | Status: AC
  Administered 2013-05-15: 1 via ORAL
  Filled 2013-05-15: qty 1

## 2013-05-15 MED ORDER — METHYLPREDNISOLONE (PAK) 4 MG PO TABS: ORAL_TABLET | ORAL | Status: DC

## 2013-05-15 NOTE — ED Notes (Signed)
md at bedside

## 2013-05-15 NOTE — Discharge Instructions (Signed)
Radicular Pain Radicular pain in either the arm or leg is usually from a bulging or herniated disk in the spine. A piece of the herniated disk may press against the nerves as the nerves exit the spine. This causes pain which is felt at the tips of the nerves down the arm or leg. Other causes of radicular pain may include:  Fractures.  Heart disease.  Cancer.  An abnormal and usually degenerative state of the nervous system or nerves (neuropathy). Diagnosis may require CT or MRI scanning to determine the primary cause.  Nerves that start at the neck (nerve roots) may cause radicular pain in the outer shoulder and arm. It can spread down to the thumb and fingers. The symptoms vary depending on which nerve root has been affected. In most cases radicular pain improves with conservative treatment. Neck problems may require physical therapy, a neck collar, or cervical traction. Treatment may take many weeks, and surgery may be considered if the symptoms do not improve.  Conservative treatment is also recommended for sciatica. Sciatica causes pain to radiate from the lower back or buttock area down the leg into the foot. Often there is a history of back problems. Most patients with sciatica are better after 2 to 4 weeks of rest and other supportive care. Short term bed rest can reduce the disk pressure considerably. Sitting, however, is not a good position since this increases the pressure on the disk. You should avoid bending, lifting, and all other activities which make the problem worse. Traction can be used in severe cases. Surgery is usually reserved for patients who do not improve within the first months of treatment. Only take over-the-counter or prescription medicines for pain, discomfort, or fever as directed by your caregiver. Narcotics and muscle relaxants may help by relieving more severe pain and spasm and by providing mild sedation. Cold or massage can give significant relief. Spinal manipulation  is not recommended. It can increase the degree of disc protrusion. Epidural steroid injections are often effective treatment for radicular pain. These injections deliver medicine to the spinal nerve in the space between the protective covering of the spinal cord and back bones (vertebrae). Your caregiver can give you more information about steroid injections. These injections are most effective when given within two weeks of the onset of pain.  You should see your caregiver for follow up care as recommended. A program for neck and back injury rehabilitation with stretching and strengthening exercises is an important part of management.  SEEK IMMEDIATE MEDICAL CARE IF:  You develop increased pain, weakness, or numbness in your arm or leg.  You develop difficulty with bladder or bowel control.  You develop abdominal pain. Document Released: 01/28/2004 Document Revised: 03/14/2011 Document Reviewed: 04/14/2008 [employer] Patient Information 2014 [employer].  

## 2013-05-15 NOTE — ED Provider Notes (Signed)
CSN: [id number]     Arrival date & time 05/15/13  9629 History   First MD Initiated Contact with Patient 05/15/13 [phone]     Chief Complaint  Patient presents with  . Shoulder Pain     (Consider location/radiation/quality/duration/timing/severity/associated sxs/prior Treatment) HPI  This is a 68 yo with history of hypertension, hyperlipidemia who presents with left shoulder and back pain. Patient reports pain started on Friday. On Thursday to your work and had some difficulty getting a weed eater started. Pain is gotten progressively worse. He currently reports 8/10 pain. Advil not helping. He did see a chiropractor who did some manipulation. Patient does report that ice to his shoulder helps. He states that sometimes pain radiates down his left arm into his elbow. He is also noted paresthesias along the ulnar distribution of the left arm and left fourth and fifth digits. He denies any weakness. Patient also states that when he turns his neck a certain way the pain shoots down his arm.  Past Medical History  Diagnosis Date  . Hyperlipidemia   . Hypertension   . GERD (gastroesophageal reflux disease)   . ED (erectile dysfunction)   . Vitamin D deficiency   . Hypogonadism male   . Obesity   . Kidney stones    Past Surgical History  Procedure Laterality Date  . Laparoscopic cholecystectomy  10/2010  . Hammer toe surgery  2012    left foot, 3rd toe   Family History  Problem Relation Age of Onset  . Colon cancer Mother 65  . Cancer Mother     ovarian  . Colon cancer Father 24  . Diabetes Father   . Cancer Father     colon,prostate  . Alzheimer's disease Father   . Stomach cancer Neg Hx    History  Substance Use Topics  . Smoking status: Never Smoker   . Smokeless tobacco: Never Used  . Alcohol Use: No    Review of Systems  Musculoskeletal:       Left shoulder and arm pain  Neurological: Positive for numbness. Negative for weakness.  All other systems reviewed and are  negative.     Allergies  Ceftin and Peanut-containing drug products  Home Medications   Prior to Admission medications   Medication Sig Start Date End Date Taking? Authorizing Provider  aspirin 81 MG tablet Take 81 mg by mouth daily.    Historical Provider, MD  atenolol (TENORMIN) 100 MG tablet Take 100 mg by mouth daily.  12/15/10   Historical Provider, MD  Cholecalciferol (HM VITAMIN D3) 2000 UNITS CAPS Take by mouth 2 (two) times daily.    Historical Provider, MD  Flaxseed, Linseed, (FLAX SEED OIL PO) Take by mouth.    Historical Provider, MD  Omega-3 Fatty Acids (FISH OIL PO) Take by mouth.    Historical Provider, MD  pravastatin (PRAVACHOL) 40 MG tablet Take 1 tablet (40 mg total) by mouth daily. 04/23/13   Melissa R Smith, PA-C  sildenafil (VIAGRA) 50 MG tablet Take 50 mg by mouth as needed for erectile dysfunction.    Historical Provider, MD   BP 146/88  Pulse 59  Temp(Src) 97.6 F (36.4 C) (Oral)  Resp 16  Ht 5\' 11"  (1.803 m)  Wt 200 lb (90.719 kg)  BMI 27.91 kg/m2  SpO2 95% Physical Exam  Nursing note and vitals reviewed. Constitutional: He is oriented to person, place, and time. He appears well-developed and well-nourished. No distress.  HENT:  Head: Normocephalic and atraumatic.  Cardiovascular:  Normal rate, regular rhythm and normal heart sounds.   No murmur heard. Pulmonary/Chest: Effort normal and breath sounds normal. No respiratory distress. He has no wheezes.  Abdominal: Soft. Bowel sounds are normal. There is no tenderness. There is no rebound.  Musculoskeletal: He exhibits no edema.  No obvious deformity to the shoulder or scapula, full-strength of the deltoids, biceps, triceps, and grip, normal range of motion at the shoulder  Lymphadenopathy:    He has no cervical adenopathy.  Neurological: He is alert and oriented to person, place, and time.  Sensation intact  Skin: Skin is warm and dry.  Psychiatric: He has a normal mood and affect.    ED Course   Procedures (including critical care time) Labs Review Labs Reviewed - No data to display  Imaging Review Dg Cervical Spine Complete  05/15/2013   CLINICAL DATA:  Left shoulder and posterior neck pain. Without mention of trauma.  EXAM: CERVICAL SPINE  4+ VIEWS  COMPARISON:  None.  FINDINGS: The cervical vertebral bodies are preserved in height. There is disc space narrowing at the C3-4 through C6-7 disc levels. There are small anterior endplate osteophytes. The prevertebral soft tissue spaces appear normal. There is mild facet joint degenerative change at multiple levels. There is mild bony encroachment upon the neural foramina especially at C5-C6 bilaterally. The odontoid is intact. The lateral masses of C1 align normally with those of C2.  IMPRESSION: There are degenerative changes of the cervical spine at all disc levels and there are mild degenerative facet joint changes especially at C5-6.   Electronically Signed   By: David  Martinique   On: 05/15/2013 07:54   Dg Shoulder Left  05/15/2013   CLINICAL DATA:  SHOULDER PAIN  EXAM: LEFT SHOULDER - 2+ VIEW  COMPARISON:  None.  FINDINGS: There is no evidence of fracture or dislocation. Areas of hypertrophic spurring along the humeral head and glenoid. Soft tissues unremarkable.  IMPRESSION: Osteoarthritic changes.  No acute osseous abnormalities.   Electronically Signed   By: Margaree Mackintosh M.D.   On: 05/15/2013 07:32     EKG Interpretation None      MDM   Final diagnoses:  Radicular pain in left arm    Patient presents with left shoulder and arm pain. He is nontoxic on exam. Neurologic exam reveals normal strength and objective sensation. Given reproduction of symptoms with movement of the neck, concern for cervical versus brachial plexus radicular pain. Films of the C-spine and shoulder were obtained. They show degenerative and osteoarthritic changes but no other abnormalities. Patient was given Norco. Given that his neurologic exam is intact,  we'll treat conservatively with Medrol Dosepak and Norco. Have recommended physical therapy. Patient will followup with primary physician for physical therapy referral. Discussed with patient that it was reported he developed weakness or worsening symptoms in the left arm, he needed to be evaluated immediately he may need CT or MRI.  After history, exam, and medical workup I feel the patient has been appropriately medically screened and is safe for discharge home. Pertinent diagnoses were discussed with the patient. Patient was given return precautions.     Merryl Hacker, MD 05/15/13 [phone]

## 2013-05-15 NOTE — ED Notes (Signed)
Pt presents with c/o left shoulder pain that started on 05/10/13. Pt says that he was doing some yard work on 5/7 and started to notice some shoulder pain when he woke up the next morning. Pt says the pain has been constant since this past Friday and he has not been able to sleep. Ambulatory to room.

## 2013-05-15 NOTE — ED Notes (Signed)
Pt escorted to discharge window. Pt verbalized understanding discharge instructions. In no acute distress.  

## 2013-05-15 NOTE — ED Notes (Signed)
Pt to xray  Pt alert and oriented x4. Respirations even and unlabored, bilateral symmetrical rise and fall of chest. Skin warm and dry. In no acute distress. Denies needs.   

## 2013-05-17 ENCOUNTER — Encounter: Payer: Self-pay | Admitting: Internal Medicine

## 2013-05-17 ENCOUNTER — Ambulatory Visit (INDEPENDENT_AMBULATORY_CARE_PROVIDER_SITE_OTHER): Payer: Medicare Other | Admitting: Internal Medicine

## 2013-05-17 VITALS — BP 150/98 | HR 72 | Temp 98.6°F | Resp 18 | Ht 70.0 in | Wt 202.0 lb

## 2013-05-17 DIAGNOSIS — M542 Cervicalgia: Secondary | ICD-10-CM

## 2013-05-17 DIAGNOSIS — M5412 Radiculopathy, cervical region: Secondary | ICD-10-CM

## 2013-05-17 MED ORDER — PREDNISONE 20 MG PO TABS: ORAL_TABLET | ORAL | Status: DC

## 2013-05-17 MED ORDER — HYDROCODONE-ACETAMINOPHEN 5-325 MG PO TABS: ORAL_TABLET | ORAL | Status: DC

## 2013-05-17 NOTE — Progress Notes (Signed)
   Subjective:    Patient ID: Jeffrey Carey, male    DOB: 15-Nov-1945, 68 y.o.   MRN: [id number]  HPI Evaluated &Treated 13 May in ER for neck pain and cervical radiculitis  & Tx'd w/Medrol dose pak. Cx & Lt shoulder XRs showed mild arthritic changes.Continues to have sharp pains radiating to the left scapula. Also has Numb tingling sensation radiating down his left arm to the 4th & 5th fingers with nl sensory- motor functions. Medication Sig  . acetaminophen500 MG  Take 1,500 mg  every 6 (six) hours as needed  . aspirin 81 MG tablet Take 81 mg  at bedtime.   Marland Kitchen atenolol 100 MG tablet Take 100 mg  at bedtime.   Marland Kitchen VITAMIN D3 2000 U Take 2,000 Units by mouth at bedtime.   Marland Kitchen ibuprofen ( 200 MG tablet Take 400 mg every 6  hours as needed for moderate pain.  . Magnesium 250 MG  Take 250 mg daily.  Marland Kitchen FISH OIL  Take 1,200 mg at bedtime.   . pravastatin (PRAVACHOL) 40 MG  Take 1 tablet (40 mg total)  daily.   Allergies  Allergen Reactions  . Ceftin [Cefuroxime Axetil] Hives  . Peanut-Containing Drug Products Other (See Comments)    Break out on nose    Past Medical History  Diagnosis Date  . Hyperlipidemia   . Hypertension   . GERD (gastroesophageal reflux disease)   . ED (erectile dysfunction)   . Vitamin D deficiency   . Hypogonadism male   . Obesity   . Kidney stones    Review of Systems - Non contributory to above.  Objective:   Physical Exam  BP 150/98  Pulse 72  Temp(Src) 98.6 F (37 C) (Temporal)  Resp 18  Ht 5\' 10"  (1.778 m)  Wt 202 lb (91.627 kg)  BMI 28.98 kg/m2  Focused exam shows normal cervical ROM and sensory- motor testing of UE bilat. Bilat Shoulder ROM likewise Nl.  Assessment & Plan:   1. Cervicalgia  - MR Cervical Spine Wo Contrast; Future  2. Cervical radiculitis  - Rx Prednisone 20 mg # 30 - sig: tid x 5 d - bid x 5 d - qd x 5 d - Norco-5 #50 - sig: 1-2 tab q4-6h prn pain (max 8 tab/24 hr)

## 2013-05-17 NOTE — Patient Instructions (Signed)
Herniated Disk The bones of your spinal column (vertebrae) protect your spinal cord and nerves that go into your arms and legs. The vertebrae are separated by disks that cushion the spinal column and put space between your vertebrae. This allows movement between the vertebrae, which allows you to bend, rotate, and move your body from side to side. Sometimes, the disks move out of place (herniate) or break open (rupture) from injury or strain. The most common area for a disk herniation is in the lower back (lumbar area). Sometimes herniation occurs in the neck (cervical) disks.  CAUSES  As we grow older, the strong, fibrous cords that connect the vertebrae and support and surround the disks (ligaments) start to weaken. A strain on the back may cause a break in the disk ligaments. RISK FACTORS Herniated disks occur most often in men who are aged 18 years to 35 years, usually after strenuous activity. Other risk factors include conditions present at birth (congenital) that affect the size of the lumbar spinal canal. Additionally, a narrowing of the areas where the nerves exit the spinal canal can occur as you age. SYMPTOMS  Symptoms of a herniated disk vary. You may have weakness in certain muscles. This weakness can include difficulty lifting your leg or arm, difficulty standing on your toes on one side, or difficulty squeezing tightly with one of your hands. You may have numbness. You may feel a mild tingling, dull ache, or a burning or pulsating pain. In some cases, the pain is severe enough that you are unable to move. The pain most often occurs on one side of the body. The pain often starts slowly. It may get worse:  After you sit or stand.  At night.  When you sneeze, cough, or laugh.  When you bend backwards or walk more than a few yards. The pain, numbness, or weakness will often go away or improve a lot over a period of weeks to months. Herniated lumbar disk Symptoms of a herniated lumbar  disk may include sharp pain in one part of your leg, hip, or buttocks and numbness in other parts. You also may feel pain or numbness on the back of your calf or the top or sole of your foot. The same leg also may feel weak. Herniated cervical disk Symptoms of a herniated cervical disk may include pain when you move your neck, deep pain near or over your shoulder blade, or pain that moves to your upper arm, forearm, or fingers. DIAGNOSIS  To diagnose a herniated disk, your caregiver will perform a physical exam. Your caregiver also may perform diagnostic tests to see your disk or to test the reaction of your muscles and the function of your nerves. During the physical exam, your caregiver may ask you to:  Sit, stand, and walk. While you walk, your caregiver may ask you to try walking on your toes and then your heels.  Bend forward, backward, and sideways.  Raise your shoulders, elbow, wrist, and fingers and check your strength during these tasks. Your caregiver will check for:  Numbness or loss of feeling.  Muscle reflexes, which may be slower or missing.  Muscle strength, which may be weaker.  Posture or the way your spine curves. Diagnostic tests that may be done include:  A spinal X-ray exam to rule out other causes of back pain.  Magnetic resonance imaging (MRI) or computed tomography (CT) scan, which will show if the herniated disk is pressing on your spinal canal.  Electromyography.   This is sometimes used to identify the specific area of nerve involvement. TREATMENT  Initial treatment for a herniated disk is a short period of rest with medicines for pain. Pain medicines can include nonsteroidal anti-inflammatory medicines (NSAIDs), muscle relaxants for back spasms, and (rarely) narcotic pain medicine for severe pain that does not respond to NSAID use. Bed rest is often limited to 1 or 2 days at the most because prolonged rest can delay recovery. When the herniation involves the  lower back, sitting should be avoided as much as possible because sitting increases pressure on the ruptured disk. Sometimes a soft neck collar will be prescribed for a few days to weeks to help support your neck in the case of a cervical herniation. Physical therapy is often prescribed for patients with disk disease. Physical therapists will teach you how to properly lift, dress, walk, and perform other activities. They will work on strengthening the muscles that help support your spine. In some cases, physical therapy alone is not enough to treat a herniated disk. Steroid injections along the involved nerve root may be needed to help control pain. The steroid is injected in the area of the herniated disk and helps by reducing swelling around the disk. Sometimes surgery is the best option to treat a herniated disk.  SEEK IMMEDIATE MEDICAL CARE IF:   You have numbness, tingling, weakness, or problems with the use of your arms or legs.  You have severe headaches that are not relieved with the use of medicines.  You notice a change in your bowel or bladder control.  You have increasing pain in any areas of your body.  You experience shortness of breath, dizziness, or fainting. MAKE SURE YOU:   Understand these instructions.  Will watch your condition.  Will get help right away if you are not doing well or get worse. Document Released: 12/18/1999 Document Revised: 03/14/2011 Document Reviewed: 07/23/2010 [employer] Patient Information 2014 [employer].  

## 2013-05-21 ENCOUNTER — Telehealth: Payer: Self-pay | Admitting: Internal Medicine

## 2013-05-21 NOTE — Telephone Encounter (Signed)
Patient called to see if MRI had been scheduled.  Told patient GI [facility], [phone]    Thank you, Katrina Judeth Horn [facility] [phone]

## 2013-05-25 ENCOUNTER — Inpatient Hospital Stay: Admission: RE | Admit: 2013-05-25 | Payer: Medicare Other | Source: Ambulatory Visit

## 2013-05-30 ENCOUNTER — Inpatient Hospital Stay: Admission: RE | Admit: 2013-05-30 | Payer: Medicare Other | Source: Ambulatory Visit

## 2013-06-09 ENCOUNTER — Ambulatory Visit
Admission: RE | Admit: 2013-06-09 | Discharge: 2013-06-09 | Disposition: A | Discharge status: Home / Self Care | Payer: Medicare Other | Source: Ambulatory Visit | Attending: Internal Medicine | Admitting: Internal Medicine

## 2013-06-09 DIAGNOSIS — M542 Cervicalgia: Secondary | ICD-10-CM

## 2013-06-12 ENCOUNTER — Other Ambulatory Visit: Payer: Self-pay | Admitting: Internal Medicine

## 2013-06-12 DIAGNOSIS — M5020 Other cervical disc displacement, unspecified cervical region: Secondary | ICD-10-CM

## 2013-06-19 ENCOUNTER — Other Ambulatory Visit: Payer: Self-pay | Admitting: Emergency Medicine

## 2013-07-15 ENCOUNTER — Encounter: Payer: Self-pay | Admitting: Internal Medicine

## 2013-07-29 ENCOUNTER — Ambulatory Visit (INDEPENDENT_AMBULATORY_CARE_PROVIDER_SITE_OTHER): Payer: Medicare Other | Admitting: Internal Medicine

## 2013-07-29 ENCOUNTER — Encounter: Payer: Self-pay | Admitting: Internal Medicine

## 2013-07-29 VITALS — BP 112/76 | HR 60 | Temp 98.2°F | Resp 16 | Ht 70.0 in | Wt 207.8 lb

## 2013-07-29 DIAGNOSIS — Z0000 Encounter for general adult medical examination without abnormal findings: Secondary | ICD-10-CM

## 2013-07-29 DIAGNOSIS — Z1212 Encounter for screening for malignant neoplasm of rectum: Secondary | ICD-10-CM

## 2013-07-29 DIAGNOSIS — Z1331 Encounter for screening for depression: Secondary | ICD-10-CM

## 2013-07-29 DIAGNOSIS — Z79899 Other long term (current) drug therapy: Secondary | ICD-10-CM

## 2013-07-29 DIAGNOSIS — E782 Mixed hyperlipidemia: Secondary | ICD-10-CM

## 2013-07-29 DIAGNOSIS — Z1330 Encounter for screening examination for mental health and behavioral disorders, unspecified: Secondary | ICD-10-CM

## 2013-07-29 DIAGNOSIS — Z789 Other specified health status: Secondary | ICD-10-CM

## 2013-07-29 DIAGNOSIS — I10 Essential (primary) hypertension: Secondary | ICD-10-CM

## 2013-07-29 DIAGNOSIS — Z1342 Encounter for screening for global developmental delays (milestones): Secondary | ICD-10-CM

## 2013-07-29 DIAGNOSIS — R7309 Other abnormal glucose: Secondary | ICD-10-CM

## 2013-07-29 DIAGNOSIS — E559 Vitamin D deficiency, unspecified: Secondary | ICD-10-CM

## 2013-07-29 DIAGNOSIS — Z125 Encounter for screening for malignant neoplasm of prostate: Secondary | ICD-10-CM

## 2013-07-29 LAB — CBC WITH DIFFERENTIAL/PLATELET
Basophils Absolute: 0 10*3/uL (ref 0.0–0.1)
Basophils Relative: 0 % (ref 0–1)
Eosinophils Absolute: 0.2 10*3/uL (ref 0.0–0.7)
Eosinophils Relative: 4 % (ref 0–5)
HEMATOCRIT: 41.1 % (ref 39.0–52.0)
HEMOGLOBIN: 14.5 g/dL (ref 13.0–17.0)
LYMPHS ABS: 1.9 10*3/uL (ref 0.7–4.0)
LYMPHS PCT: 30 % (ref 12–46)
MCH: 30.2 pg (ref 26.0–34.0)
MCHC: 35.3 g/dL (ref 30.0–36.0)
MCV: 85.6 fL (ref 78.0–100.0)
MONO ABS: 0.5 10*3/uL (ref 0.1–1.0)
MONOS PCT: 8 % (ref 3–12)
NEUTROS PCT: 58 % (ref 43–77)
Neutro Abs: 3.6 10*3/uL (ref 1.7–7.7)
Platelets: 273 10*3/uL (ref 150–400)
RBC: 4.8 MIL/uL (ref 4.22–5.81)
RDW: 13.9 % (ref 11.5–15.5)
WBC: 6.2 10*3/uL (ref 4.0–10.5)

## 2013-07-29 LAB — HEMOGLOBIN A1C
Hgb A1c MFr Bld: 5.7 % — ABNORMAL HIGH (ref <5.7)
MEAN PLASMA GLUCOSE: 117 mg/dL — AB (ref <117)

## 2013-07-29 NOTE — Progress Notes (Signed)
Patient ID: Jeffrey Carey, male   DOB: March 12, 1945, 68 y.o.   MRN: [id number]   Annual Screening Comprehensive Examination  This very nice 68 y.o.MWM presents for complete physical.  Patient has been followed for HTN, Prediabetes, Hyperlipidemia, and Vitamin D Deficiency.   HTN predates since 2006. Patient's BP has been controlled at home.Today's BP: 112/76 mmHg. Patient has mild renovascular HTN with Stage 3 CKD (GFR 51 ml/min). Patient denies any cardiac symptoms as chest pain, palpitations, shortness of breath, dizziness or ankle swelling.   Patient's hyperlipidemia is controlled with diet and medications. Patient denies myalgias or other medication SE's. Last lipids were at goal. Lab Results  Component Value Date   CHOL 148 04/23/2013   HDL 37* 04/23/2013   LDLCALC 85 04/23/2013   TRIG 131 04/23/2013   CHOLHDL 4.0 04/23/2013    Patient has prediabetes with A1c 5.9% since 2014  and last A1c was   Lab Results  Component Value Date   HGBA1C 5.6 04/23/2013    Patient denies reactive hypoglycemic symptoms, visual blurring, diabetic polys or paresthesias.    Finally, patient has history of Vitamin D Deficiency of  24  and last vitamin D was  Lab Results  Component Value Date   VD25OH 85 01/10/2013    Medication Sig  . acetaminophen 500 MG tablet Take 1,500 mg by mouth every 6  hours as needed  . aspirin 81 MG tablet Take 81 mg by mouth at bedtime.   Marland Kitchen atenolol  100 MG tablet Take 100 mg by mouth at bedtime.   Marland Kitchen VITAMIN D 2000 UNITS CAPS Take 2,000 Units by mouth at bedtime.   Marland Kitchen ibuprofen  200 MG  Take 400 mg by mouth every 6  hours as needed   . Magnesium 250 MG TABS Take 250 mg by mouth daily.  Marland Kitchen FISH OIL  Take 1,200 mg by mouth at bedtime.   . pravastatin  40 MG tablet Take 1 tablet by mouth daily.   Allergies  Allergen Reactions  . Ceftin [Cefuroxime Axetil] Hives  . Peanut-Containing Drug Products Other (See Comments)    Break out on nose    Past Medical History  Diagnosis Date   . Hyperlipidemia   . Hypertension   . GERD (gastroesophageal reflux disease)   . ED (erectile dysfunction)   . Vitamin D deficiency   . Hypogonadism male   . Obesity   . Kidney stones    Past Surgical History  Procedure Laterality Date  . Laparoscopic cholecystectomy  10/2010  . Hammer toe surgery  2012    left foot, 3rd toe   Family History  Problem Relation Age of Onset  . Colon cancer Mother 60  . Cancer Mother     ovarian  . Colon cancer Father 7  . Diabetes Father   . Cancer Father     colon,prostate  . Alzheimer's disease Father   . Stomach cancer Neg Hx    History   Social History  . Marital Status: Married    Spouse Name: N/A    Number of Children: N/A  . Years of Education: N/A   Occupational History  . 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 Smoking status: Never Smoker   . Smokeless tobacco: Never Used  . Alcohol Use: No  . Drug Use: No  . Sexual Activity: Active    ROS Constitutional: Denies fever, chills, weight loss/gain, headaches, insomnia, fatigue, night sweats  or change in appetite. Eyes: Denies redness, blurred vision, diplopia, discharge, itchy or watery eyes.  ENT: Denies discharge, congestion, post nasal drip, epistaxis, sore throat, earache, hearing loss, dental pain, Tinnitus, Vertigo, Sinus pain or snoring.  Cardio: Denies chest pain, palpitations, irregular heartbeat, syncope, dyspnea, diaphoresis, orthopnea, PND, claudication or edema Respiratory: denies cough, dyspnea, DOE, pleurisy, hoarseness, laryngitis or wheezing.  Gastrointestinal: Denies dysphagia, heartburn, reflux, water brash, pain, cramps, nausea, vomiting, bloating, diarrhea, constipation, hematemesis, melena, hematochezia, jaundice or hemorrhoids Genitourinary: Denies dysuria, frequency, urgency, nocturia, hesitancy, discharge, hematuria or flank pain Musculoskeletal: Denies arthralgia, myalgia, stiffness, Jt. Swelling, pain, limp  or strain/sprain. Denies Falls. Skin: Denies puritis, rash, hives, warts, acne, eczema or change in skin lesion Neuro: No weakness, tremor, incoordination, spasms, paresthesia or pain Psychiatric: Denies confusion, memory loss or sensory loss. Denies Depression. Endocrine: Denies change in weight, skin, hair change, nocturia, and paresthesia, diabetic polys, visual blurring or hyper / hypo glycemic episodes.  Heme/Lymph: No excessive bleeding, bruising or enlarged lymph nodes.  Physical Exam  BP 112/76  Pulse 60  Temp(Src) 98.2 F (36.8 C) (Temporal)  Resp 16  Ht $R'5\' 10"'xF$  (1.778 m)  Wt 207 lb 12.8 oz (94.257 kg)  BMI 29.82 kg/m2  General Appearance: Well nourished, in no apparent distress. Eyes: PERRLA, EOMs, conjunctiva no swelling or erythema, normal fundi and vessels. Sinuses: No frontal/maxillary tenderness ENT/Mouth: EACs patent / TMs  nl. Nares clear without erythema, swelling, mucoid exudates. Oral hygiene is good. No erythema, swelling, or exudate. Tongue normal, non-obstructing. Tonsils not swollen or erythematous. Hearing normal.  Neck: Supple, thyroid normal. No bruits, nodes or JVD. Respiratory: Respiratory effort normal.  BS equal and clear bilateral without rales, rhonci, wheezing or stridor. Cardio: Heart sounds are normal with regular rate and rhythm and no murmurs, rubs or gallops. Peripheral pulses are normal and equal bilaterally without edema. No aortic or femoral bruits. Chest: symmetric with normal excursions and percussion.  Abdomen: Flat, soft, with bowl sounds. Nontender, no guarding, rebound, hernias, masses, or organomegaly.  Lymphatics: Non tender without lymphadenopathy.  Genitourinary: No hernias.Testes nl. DRE - prostate nl for age - smooth & firm w/o nodules. Musculoskeletal: Full ROM all peripheral extremities, joint stability, 5/5 strength, and normal gait. Skin: Warm and dry without rashes, lesions, cyanosis, clubbing or  ecchymosis.  Neuro: Cranial  nerves intact, reflexes equal bilaterally. Normal muscle tone, no cerebellar symptoms. Sensation intact.  Pysch: Awake and oriented X 3with normal affect, insight and judgment appropriate.  Assessment and Plan  1. Annual Screening Examination 2. Hypertension  3. Hyperlipidemia 4. Pre Diabetes 5. Vitamin D Deficiency  Continue prudent diet as discussed, weight control, BP monitoring, regular exercise, and medications as discussed.  Discussed med effects and SE's. Routine screening labs and tests as requested with regular follow-up as recommended.

## 2013-07-29 NOTE — Patient Instructions (Signed)

## 2013-07-30 LAB — MICROALBUMIN / CREATININE URINE RATIO
Creatinine, Urine: 71.5 mg/dL
Microalb Creat Ratio: 7 mg/g (ref 0.0–30.0)
Microalb, Ur: 0.5 mg/dL (ref 0.00–1.89)

## 2013-07-30 LAB — LIPID PANEL
CHOL/HDL RATIO: 3.6 ratio
CHOLESTEROL: 124 mg/dL (ref 0–200)
HDL: 34 mg/dL — ABNORMAL LOW (ref >39)
LDL CALC: 59 mg/dL (ref 0–99)
Triglycerides: 155 mg/dL — ABNORMAL HIGH (ref <150)
VLDL: 31 mg/dL (ref 0–40)

## 2013-07-30 LAB — BASIC METABOLIC PANEL WITH GFR
BUN: 28 mg/dL — ABNORMAL HIGH (ref 6–23)
CO2: 23 meq/L (ref 19–32)
Calcium: 9.2 mg/dL (ref 8.4–10.5)
Chloride: 102 mEq/L (ref 96–112)
Creat: 1.48 mg/dL — ABNORMAL HIGH (ref 0.50–1.35)
GFR, EST AFRICAN AMERICAN: 56 mL/min — AB
GFR, Est Non African American: 49 mL/min — ABNORMAL LOW
GLUCOSE: 104 mg/dL — AB (ref 70–99)
POTASSIUM: 4.4 meq/L (ref 3.5–5.3)
SODIUM: 138 meq/L (ref 135–145)

## 2013-07-30 LAB — URINALYSIS, MICROSCOPIC ONLY
BACTERIA UA: NONE SEEN
CASTS: NONE SEEN
Crystals: NONE SEEN
SQUAMOUS EPITHELIAL / LPF: NONE SEEN

## 2013-07-30 LAB — PSA: PSA: 1.99 ng/mL (ref <=4.00)

## 2013-07-30 LAB — TSH: TSH: 1.763 u[IU]/mL (ref 0.350–4.500)

## 2013-07-30 LAB — HEPATIC FUNCTION PANEL
ALK PHOS: 51 U/L (ref 39–117)
ALT: 18 U/L (ref 0–53)
AST: 20 U/L (ref 0–37)
Albumin: 4 g/dL (ref 3.5–5.2)
BILIRUBIN DIRECT: 0.1 mg/dL (ref 0.0–0.3)
BILIRUBIN INDIRECT: 0.4 mg/dL (ref 0.2–1.2)
TOTAL PROTEIN: 6.3 g/dL (ref 6.0–8.3)
Total Bilirubin: 0.5 mg/dL (ref 0.2–1.2)

## 2013-07-30 LAB — MAGNESIUM: Magnesium: 2.1 mg/dL (ref 1.5–2.5)

## 2013-07-30 LAB — VITAMIN D 25 HYDROXY (VIT D DEFICIENCY, FRACTURES): Vit D, 25-Hydroxy: 89 ng/mL (ref 30–89)

## 2013-07-30 LAB — INSULIN, FASTING: Insulin fasting, serum: 87 u[IU]/mL — ABNORMAL HIGH (ref 3–28)

## 2013-08-23 ENCOUNTER — Other Ambulatory Visit: Payer: Self-pay | Admitting: *Deleted

## 2013-08-23 DIAGNOSIS — Z1212 Encounter for screening for malignant neoplasm of rectum: Secondary | ICD-10-CM

## 2013-08-23 LAB — POC HEMOCCULT BLD/STL (HOME/3-CARD/SCREEN)
FECAL OCCULT BLD: NEGATIVE
FECAL OCCULT BLD: NEGATIVE
Fecal Occult Blood, POC: NEGATIVE

## 2013-10-30 ENCOUNTER — Ambulatory Visit (INDEPENDENT_AMBULATORY_CARE_PROVIDER_SITE_OTHER): Payer: Medicare Other | Admitting: Physician Assistant

## 2013-10-30 ENCOUNTER — Encounter: Payer: Self-pay | Admitting: Physician Assistant

## 2013-10-30 VITALS — BP 110/70 | HR 52 | Temp 97.9°F | Resp 16 | Ht 70.0 in | Wt 210.0 lb

## 2013-10-30 DIAGNOSIS — Z0001 Encounter for general adult medical examination with abnormal findings: Secondary | ICD-10-CM

## 2013-10-30 DIAGNOSIS — I10 Essential (primary) hypertension: Secondary | ICD-10-CM

## 2013-10-30 DIAGNOSIS — R6889 Other general symptoms and signs: Secondary | ICD-10-CM

## 2013-10-30 DIAGNOSIS — Z860100 Personal history of colon polyps, unspecified: Secondary | ICD-10-CM

## 2013-10-30 DIAGNOSIS — Z9181 History of falling: Secondary | ICD-10-CM

## 2013-10-30 DIAGNOSIS — R7303 Prediabetes: Secondary | ICD-10-CM

## 2013-10-30 DIAGNOSIS — R7309 Other abnormal glucose: Secondary | ICD-10-CM

## 2013-10-30 DIAGNOSIS — Z79899 Other long term (current) drug therapy: Secondary | ICD-10-CM

## 2013-10-30 DIAGNOSIS — Z1331 Encounter for screening for depression: Secondary | ICD-10-CM

## 2013-10-30 DIAGNOSIS — E559 Vitamin D deficiency, unspecified: Secondary | ICD-10-CM

## 2013-10-30 DIAGNOSIS — E782 Mixed hyperlipidemia: Secondary | ICD-10-CM

## 2013-10-30 LAB — CBC WITH DIFFERENTIAL/PLATELET
BASOS PCT: 0 % (ref 0–1)
Basophils Absolute: 0 10*3/uL (ref 0.0–0.1)
Eosinophils Absolute: 0.2 10*3/uL (ref 0.0–0.7)
Eosinophils Relative: 4 % (ref 0–5)
HCT: 49.8 % (ref 39.0–52.0)
Hemoglobin: 16.9 g/dL (ref 13.0–17.0)
Lymphocytes Relative: 32 % (ref 12–46)
Lymphs Abs: 2 10*3/uL (ref 0.7–4.0)
MCH: 30.3 pg (ref 26.0–34.0)
MCHC: 33.9 g/dL (ref 30.0–36.0)
MCV: 89.4 fL (ref 78.0–100.0)
Monocytes Absolute: 0.4 10*3/uL (ref 0.1–1.0)
Monocytes Relative: 6 % (ref 3–12)
NEUTROS ABS: 3.5 10*3/uL (ref 1.7–7.7)
NEUTROS PCT: 58 % (ref 43–77)
PLATELETS: 250 10*3/uL (ref 150–400)
RBC: 5.57 MIL/uL (ref 4.22–5.81)
RDW: 13.3 % (ref 11.5–15.5)
WBC: 6.1 10*3/uL (ref 4.0–10.5)

## 2013-10-30 NOTE — Patient Instructions (Signed)
Preventative Care for Adults, Male       REGULAR HEALTH EXAMS:  A routine yearly physical is a good way to check in with your primary care provider about your health and preventive screening. It is also an opportunity to share updates about your health and any concerns you have, and receive a thorough all-over exam.   Most health insurance companies pay for at least some preventative services.  Check with your health plan for specific coverages.  WHAT PREVENTATIVE SERVICES DO MEN NEED?  Adult men should have their weight and blood pressure checked regularly.   Men age 35 and older should have their cholesterol levels checked regularly.  Beginning at age 50 and continuing to age 75, men should be screened for colorectal cancer.  Certain people should may need continued testing until age 85.  Other cancer screening may include exams for testicular and prostate cancer.  Updating vaccinations is part of preventative care.  Vaccinations help protect against diseases such as the flu.  Lab tests are generally done as part of preventative care to screen for anemia and blood disorders, to screen for problems with the kidneys and liver, to screen for bladder problems, to check blood sugar, and to check your cholesterol level.  Preventative services generally include counseling about diet, exercise, avoiding tobacco, drugs, excessive alcohol consumption, and sexually transmitted infections.    GENERAL RECOMMENDATIONS FOR GOOD HEALTH:  Healthy diet:  Eat a variety of foods, including fruit, vegetables, animal or vegetable protein, such as meat, fish, chicken, and eggs, or beans, lentils, tofu, and grains, such as rice.  Drink plenty of water daily.  Decrease saturated fat in the diet, avoid lots of red meat, processed foods, sweets, fast foods, and fried foods.  Exercise:  Aerobic exercise helps maintain good heart health. At least 30-40 minutes of moderate-intensity exercise is recommended.  For example, a brisk walk that increases your heart rate and breathing. This should be done on most days of the week.   Find a type of exercise or a variety of exercises that you enjoy so that it becomes a part of your daily life.  Examples are running, walking, swimming, water aerobics, and biking.  For motivation and support, explore group exercise such as aerobic class, spin class, Zumba, Yoga,or  martial arts, etc.    Set exercise goals for yourself, such as a certain weight goal, walk or run in a race such as a 5k walk/run.  Speak to your primary care provider about exercise goals.  Disease prevention:  If you smoke or chew tobacco, find out from your caregiver how to quit. It can literally save your life, no matter how long you have been a tobacco user. If you do not use tobacco, never begin.   Maintain a healthy diet and normal weight. Increased weight leads to problems with blood pressure and diabetes.   The Body Mass Index or BMI is a way of measuring how much of your body is fat. Having a BMI above 27 increases the risk of heart disease, diabetes, hypertension, stroke and other problems related to obesity. Your caregiver can help determine your BMI and based on it develop an exercise and dietary program to help you achieve or maintain this important measurement at a healthful level.  High blood pressure causes heart and blood vessel problems.  Persistent high blood pressure should be treated with medicine if weight loss and exercise do not work.   Fat and cholesterol leaves deposits in your arteries   that can block them. This causes heart disease and vessel disease elsewhere in your body.  If your cholesterol is found to be high, or if you have heart disease or certain other medical conditions, then you may need to have your cholesterol monitored frequently and be treated with medication.   Ask if you should have a stress test if your history suggests this. A stress test is a test done on  a treadmill that looks for heart disease. This test can find disease prior to there being a problem.  Avoid drinking alcohol in excess (more than two drinks per day).  Avoid use of street drugs. Do not share needles with anyone. Ask for professional help if you need assistance or instructions on stopping the use of alcohol, cigarettes, and/or drugs.  Brush your teeth twice a day with fluoride toothpaste, and floss once a day. Good oral hygiene prevents tooth decay and gum disease. The problems can be painful, unattractive, and can cause other health problems. Visit your dentist for a routine oral and dental check up and preventive care every 6-12 months.   Look at your skin regularly.  Use a mirror to look at your back. Notify your caregivers of changes in moles, especially if there are changes in shapes, colors, a size larger than a pencil eraser, an irregular border, or development of new moles.  Safety:  Use seatbelts 100% of the time, whether driving or as a passenger.  Use safety devices such as hearing protection if you work in environments with loud noise or significant background noise.  Use safety glasses when doing any work that could send debris in to the eyes.  Use a helmet if you ride a bike or motorcycle.  Use appropriate safety gear for contact sports.  Talk to your caregiver about gun safety.  Use sunscreen with a SPF (or skin protection factor) of 15 or greater.  Lighter skinned people are at a greater risk of skin cancer. Don't forget to also wear sunglasses in order to protect your eyes from too much damaging sunlight. Damaging sunlight can accelerate cataract formation.   Practice safe sex. Use condoms. Condoms are used for birth control and to help reduce the spread of sexually transmitted infections (or STIs).  Some of the STIs are gonorrhea (the clap), chlamydia, syphilis, trichomonas, herpes, HPV (human papilloma virus) and HIV (human immunodeficiency virus) which causes AIDS.  The herpes, HIV and HPV are viral illnesses that have no cure. These can result in disability, cancer and death.   Keep carbon monoxide and smoke detectors in your home functioning at all times. Change the batteries every 6 months or use a model that plugs into the wall.   Vaccinations:  Stay up to date with your tetanus shots and other required immunizations. You should have a booster for tetanus every 10 years. Be sure to get your flu shot every year, since 5%-20% of the [place] population comes down with the flu. The flu vaccine changes each year, so being vaccinated once is not enough. Get your shot in the fall, before the flu season peaks.   Other vaccines to consider:  Pneumococcal vaccine to protect against certain types of pneumonia.  This is normally recommended for adults age 65 or older.  However, adults younger than 67 years old with certain underlying conditions such as diabetes, heart or lung disease should also receive the vaccine.  Shingles vaccine to protect against Varicella Zoster if you are older than age 60, or younger   than 67 years old with certain underlying illness.  Hepatitis A vaccine to protect against a form of infection of the liver by a virus acquired from food.  Hepatitis B vaccine to protect against a form of infection of the liver by a virus acquired from blood or body fluids, particularly if you work in health care.  If you plan to travel internationally, check with your local health department for specific vaccination recommendations.  Cancer Screening:  Most routine colon cancer screening begins at the age of 50. On a yearly basis, doctors may provide special easy to use take-home tests to check for hidden blood in the stool. Sigmoidoscopy or colonoscopy can detect the earliest forms of colon cancer and is life saving. These tests use a small camera at the end of a tube to directly examine the colon. Speak to your caregiver about this at age 50, when routine  screening begins (and is repeated every 5 years unless early forms of pre-cancerous polyps or small growths are found).   At the age of 50 men usually start screening for prostate cancer every year. Screening may begin at a younger age for those with higher risk. Those at higher risk include African-Americans or having a family history of prostate cancer. There are two types of tests for prostate cancer:   Prostate-specific antigen (PSA) testing. Recent studies raise questions about prostate cancer using PSA and you should discuss this with your caregiver.   Digital rectal exam (in which your doctor's lubricated and gloved finger feels for enlargement of the prostate through the anus).   Screening for testicular cancer.  Do a monthly exam of your testicles. Gently roll each testicle between your thumb and fingers, feeling for any abnormal lumps. The best time to do this is after a hot shower or bath when the tissues are looser. Notify your caregivers of any lumps, tenderness or changes in size or shape immediately.     

## 2013-10-30 NOTE — Progress Notes (Signed)
MEDICARE ANNUAL WELLNESS VISIT AND FOLLOW UP Assessment:   1. Essential hypertension - CBC with Differential - BASIC METABOLIC PANEL WITH GFR - Hepatic function panel - TSH  2. History of colonic polyps Due 2017  3. Encounter for long-term (current) use of medications  4. Hyperlipidemia - Lipid panel  5. Prediabetes Discussed general issues about diabetes pathophysiology and management., Educational material distributed., Suggested low cholesterol diet., Encouraged aerobic exercise., Discussed foot care., Reminded to get yearly retinal exam. - Hemoglobin A1c - Insulin, fasting - HM DIABETES FOOT EXAM  6. Vitamin D deficiency - Vit D  25 hydroxy (rtn osteoporosis monitoring)  7. Encounter for general adult medical examination with abnormal findings - Magnesium  8. Screening for depression negative  9. At low risk for fall   Plan:   During the course of the visit the patient was educated and counseled about appropriate screening and preventive services including:    Pneumococcal vaccine   Influenza vaccine  Td vaccine  Screening electrocardiogram  Colorectal cancer screening  Diabetes screening  Glaucoma screening  Nutrition counseling   Screening recommendations, referrals: Vaccinations: Please see documentation below and orders this visit.  Nutrition assessed and recommended  Colonoscopy due 2017 Recommended yearly ophthalmology/optometry visit for glaucoma screening and checkup Recommended yearly dental visit for hygiene and checkup Advanced directives - requested  Conditions/risks identified: BMI: Discussed weight loss, diet, and increase physical activity.  Increase physical activity: AHA recommends 150 minutes of physical activity a week.  Medications reviewed Diabetes is at goal, ACE/ARB therapy: No, Reason not on Ace Inhibitor/ARB therapy:  only PreDM Urinary Incontinence is not an issue: discussed non pharmacology and pharmacology options.   Fall risk: low- discussed PT, home fall assessment, medications.    Subjective:  Jeffrey Carey is a 68 y.o. male who presents for Medicare Annual Wellness Visit and 3 month follow up for HTN, hyperlipidemia, prediabetes, and vitamin D Def.  Date of last medicare wellness visit was is unknown.  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 He denies chest pain, shortness of breath, dizziness.  He is on cholesterol medication and denies myalgias. His cholesterol is at goal. The cholesterol last visit was:   Lab Results  Component Value Date   CHOL 124 07/29/2013   HDL 34* 07/29/2013   LDLCALC 59 07/29/2013   TRIG 155* 07/29/2013   CHOLHDL 3.6 07/29/2013   He has been working on diet and exercise for prediabetes, he is on cinnamon, and denies paresthesia of the feet, polydipsia, polyuria and visual disturbances. Last A1C in the office was:  Lab Results  Component Value Date   HGBA1C 5.7* 07/29/2013   Patient is on Vitamin D supplement.   Lab Results  Component Value Date   VD25OH 89 07/29/2013      Names of [facility] Physician/Practitioners you currently use: 1. [facility] here for primary care 2. Dr. Hassell Done, eye doctor, last visit 07/2013 3. Dr. Ottis Stain, dentist, last visit 6 months ago 4. Dr. Deatra Ina, GI Patient Care Team: [name], MD as PCP - General (Internal Medicine) Claybon Jabs, MD as Consulting Physician (Urology)  Medication Review: Current Outpatient Prescriptions on File Prior to Visit  Medication Sig Dispense Refill  . acetaminophen (TYLENOL) 500 MG tablet Take 1,500 mg by mouth every 6 (six) hours as needed for mild pain.      Marland Kitchen aspirin  81 MG tablet Take 81 mg by mouth at bedtime.       Marland Kitchen atenolol (TENORMIN) 100 MG tablet Take 100 mg by mouth at bedtime.       . Cholecalciferol (HM VITAMIN D3) 2000 UNITS CAPS Take 2,000 Units by mouth  at bedtime.       Marland Kitchen ibuprofen (ADVIL,MOTRIN) 200 MG tablet Take 400 mg by mouth every 6 (six) hours as needed for moderate pain.      . Magnesium 250 MG TABS Take 250 mg by mouth daily.      . Omega-3 Fatty Acids (FISH OIL PO) Take 1,200 mg by mouth at bedtime.       . pravastatin (PRAVACHOL) 40 MG tablet Take 1 tablet (40 mg total) by mouth daily.  90 tablet  2   No current facility-administered medications on file prior to visit.    Current Problems (verified) Patient Active Problem List   Diagnosis Date Noted  . Screening for malignant neoplasm of the rectum 07/29/2013  . Essential hypertension 01/10/2013  . Hyperlipidemia 01/10/2013  . Prediabetes 01/10/2013  . Vitamin D deficiency 01/10/2013  . Encounter for long-term (current) use of medications 01/10/2013  . History of colonic polyps 08/28/2009    Screening Tests Health Maintenance  Topic Date Due  . Zostavax  08/04/2006  . Influenza Vaccine  08/03/2013  . Colonoscopy  03/24/2016  . Tetanus/tdap  06/14/2016  . Pneumococcal Polysaccharide Vaccine Age 36 And Over  Completed    Immunization History  Administered Date(s) Administered  . Influenza Whole 10/10/2012  . Pneumococcal Polysaccharide-23 07/10/2012   Preventative care: Last colonoscopy: 2012 due 5 years EGD 2011 CT AB 2011, simple liver cyst Cervical MRI + OA/DDD  Prior vaccinations: TD or Tdap: 2008  Influenza: 2015 Pneumococcal: 2014 Prevnar DUE Shingles/Zostavax: due to cost History reviewed: allergies, current medications, past family history, past medical history, past social history, past surgical history and problem list  Risk Factors: Tobacco History  Substance Use Topics  . Smoking status: Never Smoker   . Smokeless tobacco: Never Used  . Alcohol Use: No   He does not smoke.  Patient is not a former smoker. Are there smokers in your home (Jeffrey than you)?  No  Alcohol Current alcohol use: none  Caffeine Current caffeine use: coffee  1 /day  Exercise Current exercise: no regular exercise and but stays active  Nutrition/Diet Current diet: in general, a "healthy" diet    Cardiac risk factors: advanced age (older than 2 for men, 77 for women), diabetes mellitus, dyslipidemia, hypertension, male gender, obesity (BMI >= 30 kg/m2) and sedentary lifestyle.  Depression Screen (Note: if answer to either of the following is "Yes", a more complete depression screening is indicated)   Q1: Over the past two weeks, have you felt down, depressed or hopeless? No  Q2: Over the past two weeks, have you felt little interest or pleasure in doing things? No  Have you lost interest or pleasure in daily life? No  Do you often feel hopeless? No  Do you cry easily over simple problems? No  Activities of Daily Living In your present state of health, do you have any difficulty performing the following activities?:  Driving? No Managing money?  No Feeding yourself? No Getting from bed to chair? No Climbing a flight of stairs? No Preparing food and eating?: No Bathing or showering? No Getting dressed: No Getting to the toilet? No Using the toilet:No Moving around from place to place: No In the  past year have you fallen or had a near fall?:No   Are you sexually active?  Yes  Do you have more than one partner?  No  Vision Difficulties: No  Hearing Difficulties: No Do you often ask people to speak up or repeat themselves? No Do you experience ringing or noises in your ears? No Do you have difficulty understanding soft or whispered voices? No  Cognition  Do you feel that you have a problem with memory?No  Do you often misplace items? No  Do you feel safe at home?  Yes  Advanced directives Does patient have a [place]? Yes Does patient have a Living Will? Yes   Objective:   Blood pressure 110/70, pulse 52, temperature 97.9 F (36.6 C), resp. rate 16, height 5\' 10"  (1.778 m), weight 210 lb (95.255  kg). Body mass index is 30.13 kg/(m^2).  General appearance: alert, no distress, WD/WN, male Cognitive Testing  Alert? Yes  Normal Appearance?Yes  Oriented to person? Yes  Place? Yes   Time? Yes  Recall of three objects?  Yes  Can perform simple calculations? Yes  Displays appropriate judgment?Yes  Can read the correct time from a watch face?Yes  HEENT: normocephalic, sclerae anicteric, TMs pearly, nares patent, no discharge or erythema, pharynx normal Oral cavity: MMM, no lesions Neck: supple, no lymphadenopathy, no thyromegaly, no masses Heart: RRR, normal S1, S2, no murmurs Lungs: CTA bilaterally, no wheezes, rhonchi, or rales Abdomen: +bs, soft, non tender, non distended, no masses, no hepatomegaly, no splenomegaly Musculoskeletal: nontender, no swelling, no obvious deformity Extremities: no edema, no cyanosis, no clubbing Pulses: 2+ symmetric, upper and lower extremities, normal cap refill Neurological: alert, oriented x 3, CN2-12 intact, strength normal upper extremities and lower extremities, sensation normal throughout, DTRs 2+ throughout, no cerebellar signs, gait normal Psychiatric: normal affect, behavior normal, pleasant   Medicare Attestation I have personally reviewed: The patient's medical and social history Their use of alcohol, tobacco or illicit drugs Their current medications and supplements The patient's functional ability including ADLs,fall risks, home safety risks, cognitive, and hearing and visual impairment Diet and physical activities Evidence for depression or mood disorders  The patient's weight, height, BMI, and visual acuity have been recorded in the chart.  I have made referrals, counseling, and provided education to the patient based on review of the above and I have provided the patient with a written personalized care plan for preventive services.     Vicie Mutters, PA-C   10/30/2013

## 2013-10-31 LAB — VITAMIN D 25 HYDROXY (VIT D DEFICIENCY, FRACTURES): Vit D, 25-Hydroxy: 85 ng/mL (ref 30–89)

## 2013-10-31 LAB — LIPID PANEL
CHOL/HDL RATIO: 3.7 ratio
CHOLESTEROL: 138 mg/dL (ref 0–200)
HDL: 37 mg/dL — AB (ref >39)
LDL Cholesterol: 67 mg/dL (ref 0–99)
Triglycerides: 169 mg/dL — ABNORMAL HIGH (ref <150)
VLDL: 34 mg/dL (ref 0–40)

## 2013-10-31 LAB — HEMOGLOBIN A1C
Hgb A1c MFr Bld: 5.6 % (ref <5.7)
MEAN PLASMA GLUCOSE: 114 mg/dL (ref <117)

## 2013-10-31 LAB — INSULIN, FASTING: Insulin fasting, serum: 11.9 u[IU]/mL (ref 2.0–19.6)

## 2013-10-31 LAB — BASIC METABOLIC PANEL WITH GFR
BUN: 21 mg/dL (ref 6–23)
CALCIUM: 9.7 mg/dL (ref 8.4–10.5)
CO2: 29 mEq/L (ref 19–32)
Chloride: 103 mEq/L (ref 96–112)
Creat: 1.49 mg/dL — ABNORMAL HIGH (ref 0.50–1.35)
GFR, EST AFRICAN AMERICAN: 55 mL/min — AB
GFR, EST NON AFRICAN AMERICAN: 48 mL/min — AB
Glucose, Bld: 82 mg/dL (ref 70–99)
POTASSIUM: 5.1 meq/L (ref 3.5–5.3)
SODIUM: 138 meq/L (ref 135–145)

## 2013-10-31 LAB — HEPATIC FUNCTION PANEL
ALT: 21 U/L (ref 0–53)
AST: 21 U/L (ref 0–37)
Albumin: 4.4 g/dL (ref 3.5–5.2)
Alkaline Phosphatase: 62 U/L (ref 39–117)
BILIRUBIN DIRECT: 0.1 mg/dL (ref 0.0–0.3)
BILIRUBIN TOTAL: 0.4 mg/dL (ref 0.2–1.2)
Indirect Bilirubin: 0.3 mg/dL (ref 0.2–1.2)
Total Protein: 7 g/dL (ref 6.0–8.3)

## 2013-10-31 LAB — MAGNESIUM: MAGNESIUM: 2.1 mg/dL (ref 1.5–2.5)

## 2013-10-31 LAB — TSH: TSH: 2.212 u[IU]/mL (ref 0.350–4.500)

## 2013-12-25 ENCOUNTER — Other Ambulatory Visit: Payer: Self-pay | Admitting: Emergency Medicine

## 2013-12-25 ENCOUNTER — Other Ambulatory Visit: Payer: Self-pay | Admitting: Internal Medicine

## 2014-01-01 ENCOUNTER — Other Ambulatory Visit: Payer: Self-pay | Admitting: Internal Medicine

## 2014-01-06 ENCOUNTER — Other Ambulatory Visit: Payer: Self-pay | Admitting: *Deleted

## 2014-01-06 MED ORDER — PRAVASTATIN SODIUM 40 MG PO TABS: ORAL_TABLET | ORAL | Status: DC

## 2014-02-05 ENCOUNTER — Ambulatory Visit (INDEPENDENT_AMBULATORY_CARE_PROVIDER_SITE_OTHER): Payer: Medicare Other | Admitting: Internal Medicine

## 2014-02-05 ENCOUNTER — Encounter: Payer: Self-pay | Admitting: Internal Medicine

## 2014-02-05 VITALS — BP 118/78 | HR 52 | Temp 97.9°F | Resp 16 | Ht 70.0 in | Wt 212.0 lb

## 2014-02-05 DIAGNOSIS — Z0000 Encounter for general adult medical examination without abnormal findings: Secondary | ICD-10-CM

## 2014-02-05 DIAGNOSIS — R6889 Other general symptoms and signs: Secondary | ICD-10-CM | POA: Diagnosis not present

## 2014-02-05 DIAGNOSIS — E782 Mixed hyperlipidemia: Secondary | ICD-10-CM | POA: Diagnosis not present

## 2014-02-05 DIAGNOSIS — R7309 Other abnormal glucose: Secondary | ICD-10-CM

## 2014-02-05 DIAGNOSIS — Z23 Encounter for immunization: Secondary | ICD-10-CM

## 2014-02-05 DIAGNOSIS — Z1331 Encounter for screening for depression: Secondary | ICD-10-CM

## 2014-02-05 DIAGNOSIS — R7303 Prediabetes: Secondary | ICD-10-CM

## 2014-02-05 DIAGNOSIS — K219 Gastro-esophageal reflux disease without esophagitis: Secondary | ICD-10-CM

## 2014-02-05 DIAGNOSIS — Z9181 History of falling: Secondary | ICD-10-CM

## 2014-02-05 DIAGNOSIS — Z79899 Other long term (current) drug therapy: Secondary | ICD-10-CM

## 2014-02-05 DIAGNOSIS — I10 Essential (primary) hypertension: Secondary | ICD-10-CM

## 2014-02-05 DIAGNOSIS — Z0001 Encounter for general adult medical examination with abnormal findings: Secondary | ICD-10-CM | POA: Diagnosis not present

## 2014-02-05 DIAGNOSIS — E559 Vitamin D deficiency, unspecified: Secondary | ICD-10-CM | POA: Diagnosis not present

## 2014-02-05 LAB — CBC WITH DIFFERENTIAL/PLATELET
BASOS ABS: 0.1 10*3/uL (ref 0.0–0.1)
Basophils Relative: 1 % (ref 0–1)
Eosinophils Absolute: 0.3 10*3/uL (ref 0.0–0.7)
Eosinophils Relative: 4 % (ref 0–5)
HEMATOCRIT: 45.7 % (ref 39.0–52.0)
Hemoglobin: 15.4 g/dL (ref 13.0–17.0)
LYMPHS ABS: 2 10*3/uL (ref 0.7–4.0)
Lymphocytes Relative: 32 % (ref 12–46)
MCH: 29.7 pg (ref 26.0–34.0)
MCHC: 33.7 g/dL (ref 30.0–36.0)
MCV: 88.1 fL (ref 78.0–100.0)
MPV: 8.8 fL (ref 8.6–12.4)
Monocytes Absolute: 0.4 10*3/uL (ref 0.1–1.0)
Monocytes Relative: 7 % (ref 3–12)
NEUTROS ABS: 3.6 10*3/uL (ref 1.7–7.7)
Neutrophils Relative %: 56 % (ref 43–77)
PLATELETS: 241 10*3/uL (ref 150–400)
RBC: 5.19 MIL/uL (ref 4.22–5.81)
RDW: 13.8 % (ref 11.5–15.5)
WBC: 6.4 10*3/uL (ref 4.0–10.5)

## 2014-02-05 LAB — HEMOGLOBIN A1C
Hgb A1c MFr Bld: 5.6 % (ref <5.7)
Mean Plasma Glucose: 114 mg/dL (ref <117)

## 2014-02-05 NOTE — Patient Instructions (Signed)

## 2014-02-05 NOTE — Progress Notes (Addendum)
Patient ID: Jeffrey Carey, male   DOB: October 14, 1945, 69 y.o.   MRN: [id number] .  MEDICARE ANNUAL WELLNESS VISIT AND CPE  Assessment:   1. Essential hypertension  - TSH  2. Hyperlipidemia  - Lipid panel  3. Prediabetes  - Hemoglobin A1c - Insulin, fasting  4. Vitamin D deficiency  - Vit D  25 hydroxy (rtn osteoporosis monitoring)  5. Encounter for long-term (current) use of medications  - CBC with Differential/Platelet - BASIC METABOLIC PANEL WITH GFR - Hepatic function panel - Magnesium  6. Need for prophylactic vaccination against Streptococcus pneumoniae (pneumococcus)  - Pneumococcal conjugate vaccine 13-valent  Plan:   During the course of the visit the patient was educated and counseled about appropriate screening and preventive services including:    Pneumococcal vaccine   Influenza vaccine  Td vaccine  Screening electrocardiogram  Bone densitometry screening  Colorectal cancer screening  Diabetes screening  Glaucoma screening  Nutrition counseling   Advanced directives: requested  Screening recommendations, referrals: Vaccinations: Dt vaccine 2008 Influenza vaccine HD Sept 2015 Pneumococcal vaccine 07/2012 Prevnar vaccine 02/05/2014 Shingles vaccine undecided Hep B vaccine not indicated  Nutrition assessed and recommended  Colonoscopy 03/2010 Deatra Ina - 5 yr f/u Recommended yearly ophthalmology/optometry visit for glaucoma screening and checkup Recommended yearly dental visit for hygiene and checkup Advanced directives - yes  Conditions/risks identified: BMI: Discussed weight loss, diet, and increase physical activity.  Increase physical activity: AHA recommends 150 minutes of physical activity a week.  Medications reviewed PreDiabetes is at goal, ACE/ARB therapy: not indicated Urinary Incontinence is not an issue: discussed non pharmacology and pharmacology options.  Fall risk: low- discussed PT, home fall assessment, medications.     Subjective:  Jeffrey Carey is a 69 y.o. MWM who presents for Medicare Annual Wellness Visit and OV.     This very nice 69 y.o. MWM presents for 3 month follow up with Hypertension, Hyperlipidemia, Morbid Obesity,  Pre-Diabetes and Vitamin D Deficiency. Patient has GERD which has been controlled with prudent diet.    Patient is treated for HTN & BP's have been controlled at home. Today's BP is at goal at 118/78. Patient has had no complaints of any cardiac type chest pain, palpitations, dyspnea/orthopnea/PND, dizziness, claudication, or dependent edema.   Hyperlipidemia is controlled with diet & meds. Patient denies myalgias or other med SE's. Last Lipids were at goal - Total Chol 138; HDL  37*; LDL  67; Trig 169 on 10/30/2013.   Also, the patient has history of Morbid Obesity (BMI 30.42) and consequent PreDiabetes and he's  has had no symptoms of reactive hypoglycemia, diabetic polys, paresthesias or visual blurring.  Last A1c was at goal with 5.6% on 10/30/2013.   Further, the patient also has history of Vitamin D Deficiency and supplements vitamin D without any suspected side-effects. Last vitamin D was  85 on 10/30/2013.   Names of Other Physician/Practitioners you currently use: 1. [facility] here for primary care 2. Dr Sherlean Foot, eye doctor, last visit 2015 3. Dr Maris Berger, dentist, last visit jan 2016  Patient Care Team: [name], MD as PCP - General (Internal Medicine) Claybon Jabs, MD as Consulting Physician (Urology) Jolyne Loa, DDS (Dentistry) [facility])  Medication Review: Medication Sig  . aspirin 81 MG tablet Take 81 mg by mouth at bedtime.   Marland Kitchen atenolol 100 MG tablet TAKE 1 TABLET A DAY FOR BLOOD PRESSURE  . VITAMIN D3 2000 UNITS Take 2,000 Units  by mouth at bedtime.   Marland Kitchen ibuprofen  200 MG tablet Take 400 mg by mouth every 6 (six) hours as needed  . Magnesium 250 MG TABS Take 250 mg by mouth daily.  Marland Kitchen  FISH OIL  Take 1,200 mg by mouth at bedtime.   . pravastatin  40 MG tablet TAKE 1 TABLET (40 MG TOTAL) BY MOUTH DAILY.   Current Problems (verified) Patient Active Problem List   Diagnosis Date Noted  . Essential hypertension 01/10/2013  . Hyperlipidemia 01/10/2013  . Prediabetes 01/10/2013  . Vitamin D deficiency 01/10/2013  . Encounter for long-term (current) use of medications 01/10/2013  . History of colonic polyps 08/28/2009   Screening Tests Health Maintenance  Topic Date Due  . ZOSTAVAX  08/04/2006  . INFLUENZA VACCINE  08/04/2014  . COLONOSCOPY  03/24/2016  . TETANUS/TDAP  06/14/2016  . PNEUMOCOCCAL POLYSACCHARIDE VACCINE AGE 68 AND OVER  Completed   Immunization History  Administered Date(s) Administered  . Influenza Whole 10/10/2012  . Pneumococcal Conjugate-13 02/05/2014  . Pneumococcal Polysaccharide-23 07/10/2012   Preventative care: Last colonoscopy: 03/2010 - 5 yr f/u - Dr Deatra Ina  Prior vaccinations: TD : 2008  Influenza: HD 09/2013 Pneumococcal: 07/2012 Prevnar/13: 02/05/2014 Shingles/Zostavax: undecided- deferred due to 95$ co-pay  History reviewed: allergies, current medications, past family history, past medical history, past social history, past surgical history and problem list  Risk Factors: Tobacco History  Substance Use Topics  . Smoking status: Never Smoker   . Smokeless tobacco: Never Used  . Alcohol Use: No   He does not smoke.  Patient is not a former smoker. Are there smokers in your home (other than you)?  No  Alcohol Current alcohol use: none  Caffeine Current caffeine use: coffee 2 cups /day  Exercise Current exercise: walking  Nutrition/Diet Current diet: in general, a "healthy" diet    Cardiac risk factors: advanced age (older than 68 for men, 41 for women), dyslipidemia, hypertension, male gender and sedentary lifestyle.  Depression Screen (Note: if answer to either of the following is "Yes", a more complete depression  screening is indicated)   Q1: Over the past two weeks, have you felt down, depressed or hopeless? No  Q2: Over the past two weeks, have you felt little interest or pleasure in doing things? No  Have you lost interest or pleasure in daily life? No  Do you often feel hopeless? No  Do you cry easily over simple problems? No  Activities of Daily Living In your present state of health, do you have any difficulty performing the following activities?:  Driving? No Managing money?  No Feeding yourself? No Getting from bed to chair? No Climbing a flight of stairs? No Preparing food and eating?: No Bathing or showering? No Getting dressed: No Getting to the toilet? No Using the toilet:No Moving around from place to place: No In the past year have you fallen or had a near fall?:No   Are you sexually active?  Yes  Do you have more than one partner?  No  Vision Difficulties: No  Hearing Difficulties: No Do you often ask people to speak up or repeat themselves? No Do you experience ringing or noises in your ears? No Do you have difficulty understanding soft or whispered voices? No  Cognition  Do you feel that you have a problem with memory?No  Do you often misplace items? No  Do you feel safe at home?  Yes  Advanced directives Does patient have a [place] of  Attorney? Yes Does patient have a Living Will? Yes  ROS: In addition to the HPI above,  No Fever-chills,  No Headache, No changes with Vision or hearing,  No problems swallowing food or Liquids,  No Chest pain or productive Cough or Shortness of Breath,  No Abdominal pain, No Nausea or Vomitting, Bowel movements are regular,  No Blood in stool or Urine,  No dysuria,  No new skin rashes or bruises,  No new joints pains-aches,  No new weakness, tingling, numbness in any extremity,  No recent weight loss,  No polyuria, polydypsia or polyphagia,  No significant Mental Stressors.  A full 10 point Review of Systems  was done, except as stated above, all other Review of Systems were negative  Objective:     BP 118/78   Pulse 52  Temp 97.9 F   Resp 16  Ht 5\' 10"    Wt 212 lb      BMI 30.42   General appearance: alert, no distress, WD/WN, male   Cognitive Testing  Alert? Yes  Normal Appearance? Yes  Oriented to person? Yes  Place? Yes   Time? Yes  Recall of three objects?  Yes  Can perform simple calculations? Yes  Displays appropriate judgment? Yes  Can read the correct time from a watch/clock? Yes  HEENT: normocephalic, sclerae anicteric, TMs pearly, nares patent, no discharge or erythema, pharynx normal Oral cavity: MMM, no lesions Neck: supple, no lymphadenopathy, no thyromegaly, no masses Heart: RRR, normal S1, S2, no murmurs Lungs: CTA bilaterally, no wheezes, rhonchi, or rales Abdomen: +bs, soft, non tender, non distended, no masses, no hepatomegaly, no splenomegaly Musculoskeletal: nontender, no swelling, no obvious deformity Extremities: no edema, no cyanosis, no clubbing Pulses: 2+ symmetric, upper and lower extremities, normal cap refill Neurological: alert, oriented x 3, CN2-12 intact, strength normal upper extremities and lower extremities, sensation normal throughout, DTRs 2+ throughout, no cerebellar signs, gait normal Psychiatric: normal affect, behavior normal, pleasant   Medicare Attestation I have personally reviewed: The patient's medical and social history Their use of alcohol, tobacco or illicit drugs Their current medications and supplements The patient's functional ability including ADLs,fall risks, home safety risks, cognitive, and hearing and visual impairment Diet and physical activities Evidence for depression or mood disorders  The patient's weight, height, BMI, and visual acuity have been recorded in the chart.  I have made referrals, counseling, and provided education to the patient based on review of the above and I have provided the patient with a written  personalized care plan for preventive services.    Jeffrey Carey,Hance DAVID, MD   02/06/2014

## 2014-02-06 LAB — HEPATIC FUNCTION PANEL
ALT: 19 U/L (ref 0–53)
AST: 19 U/L (ref 0–37)
Albumin: 4.1 g/dL (ref 3.5–5.2)
Alkaline Phosphatase: 56 U/L (ref 39–117)
BILIRUBIN DIRECT: 0.1 mg/dL (ref 0.0–0.3)
Indirect Bilirubin: 0.5 mg/dL (ref 0.2–1.2)
TOTAL PROTEIN: 6.4 g/dL (ref 6.0–8.3)
Total Bilirubin: 0.6 mg/dL (ref 0.2–1.2)

## 2014-02-06 LAB — LIPID PANEL
Cholesterol: 121 mg/dL (ref 0–200)
HDL: 36 mg/dL — ABNORMAL LOW (ref >39)
LDL CALC: 64 mg/dL (ref 0–99)
TRIGLYCERIDES: 106 mg/dL (ref <150)
Total CHOL/HDL Ratio: 3.4 Ratio
VLDL: 21 mg/dL (ref 0–40)

## 2014-02-06 LAB — INSULIN, FASTING: INSULIN FASTING, SERUM: 14.7 u[IU]/mL (ref 2.0–19.6)

## 2014-02-06 LAB — BASIC METABOLIC PANEL WITH GFR
BUN: 24 mg/dL — ABNORMAL HIGH (ref 6–23)
CO2: 31 meq/L (ref 19–32)
Calcium: 9.6 mg/dL (ref 8.4–10.5)
Chloride: 104 mEq/L (ref 96–112)
Creat: 1.33 mg/dL (ref 0.50–1.35)
GFR, Est African American: 63 mL/min
GFR, Est Non African American: 55 mL/min — ABNORMAL LOW
GLUCOSE: 91 mg/dL (ref 70–99)
Potassium: 5.3 mEq/L (ref 3.5–5.3)
SODIUM: 142 meq/L (ref 135–145)

## 2014-02-06 LAB — VITAMIN D 25 HYDROXY (VIT D DEFICIENCY, FRACTURES): VIT D 25 HYDROXY: 66 ng/mL (ref 30–100)

## 2014-02-06 LAB — MAGNESIUM: MAGNESIUM: 2 mg/dL (ref 1.5–2.5)

## 2014-02-06 LAB — TSH: TSH: 2.084 u[IU]/mL (ref 0.350–4.500)

## 2014-02-19 DIAGNOSIS — M25512 Pain in left shoulder: Secondary | ICD-10-CM | POA: Diagnosis not present

## 2014-02-19 DIAGNOSIS — M542 Cervicalgia: Secondary | ICD-10-CM | POA: Diagnosis not present

## 2014-02-24 ENCOUNTER — Other Ambulatory Visit: Payer: Self-pay | Admitting: Internal Medicine

## 2014-02-24 DIAGNOSIS — M5440 Lumbago with sciatica, unspecified side: Secondary | ICD-10-CM

## 2014-02-24 MED ORDER — PREDNISONE 20 MG PO TABS: ORAL_TABLET | ORAL | Status: DC

## 2014-03-04 DIAGNOSIS — M542 Cervicalgia: Secondary | ICD-10-CM | POA: Diagnosis not present

## 2014-03-14 DIAGNOSIS — M5412 Radiculopathy, cervical region: Secondary | ICD-10-CM | POA: Diagnosis not present

## 2014-03-17 DIAGNOSIS — M5412 Radiculopathy, cervical region: Secondary | ICD-10-CM | POA: Diagnosis not present

## 2014-03-20 DIAGNOSIS — M542 Cervicalgia: Secondary | ICD-10-CM | POA: Diagnosis not present

## 2014-03-25 DIAGNOSIS — M5412 Radiculopathy, cervical region: Secondary | ICD-10-CM | POA: Diagnosis not present

## 2014-04-04 DIAGNOSIS — M542 Cervicalgia: Secondary | ICD-10-CM | POA: Diagnosis not present

## 2014-04-11 DIAGNOSIS — M5412 Radiculopathy, cervical region: Secondary | ICD-10-CM | POA: Diagnosis not present

## 2014-04-16 ENCOUNTER — Other Ambulatory Visit: Payer: Self-pay | Admitting: Orthopedic Surgery

## 2014-04-28 NOTE — Pre-Procedure Instructions (Addendum)
YOUSIF Carey  04/28/2014   Your procedure is scheduled on: Wednesday, April 30, 2014  Report to [facility] Admitting at 10:30 AM.  Call this number if Jeffrey Carey have problems the morning of surgery: [phone]   Remember:   Do not eat food or drink liquids after midnight.   Take these medicines the morning of surgery with A SIP OF WATer    None  (atenolol taken at nite)  Stop taking Aspirin, vitamins and herbal medications ( Cinnamon and Fish Oil,magnesium ) Do not take any NSAIDs ie: Ibuprofen, Advil, Naproxen or any medication containing Aspirin; stop now.   Do not wear jewelry, make-up or nail polish.  Do not wear lotions, powders, or perfumes. Jeffrey Carey may not wear deodorant.  Do not shave 48 hours prior to surgery. Men may shave face and neck.  Do not bring valuables to the hospital.  [facility] is not responsible for any belongings or valuables.               Contacts, dentures or bridgework may not be worn into surgery.  Leave suitcase in the car. After surgery it may be brought to your room.  For patients admitted to the hospital, discharge time is determined by your treatment team.               Patients discharged the day of surgery will not be allowed to drive home.  Name and phone number of your driver:   Special Instructions:  Special Instructions:Special Instructions: [facility] - Preparing for Surgery  Before surgery, Jeffrey Carey can play an important role.  Because skin is not sterile, your skin needs to be as free of germs as possible.  Jeffrey Carey can reduce the number of germs on Jeffrey Carey skin by washing with CHG (chlorahexidine gluconate) soap before surgery.  CHG is an antiseptic cleaner which kills germs and bonds with the skin to continue killing germs even after washing.  Please DO NOT use if Jeffrey Carey have an allergy to CHG or antibacterial soaps.  If your skin becomes reddened/irritated stop using the CHG and inform your nurse when Jeffrey Carey arrive at Short Stay.  Do not shave  (including legs and underarms) for at least 48 hours prior to the first CHG shower.  Jeffrey Carey may shave your face.  Please follow these instructions carefully:   1.  Shower with CHG Soap the night before surgery and the morning of Surgery.  2.  If Jeffrey Carey choose to wash your hair, wash your hair first as usual with your normal shampoo.  3.  After Jeffrey Carey shampoo, rinse your hair and body thoroughly to remove the Shampoo.  4.  Use CHG as Jeffrey Carey would any other liquid soap.  Jeffrey Carey can apply chg directly  to the skin and wash gently with scrungie or a clean washcloth.  5.  Apply the CHG Soap to your body ONLY FROM THE NECK DOWN.  Do not use on open wounds or open sores.  Avoid contact with your eyes, ears, mouth and genitals (private parts).  Wash genitals (private parts) with your normal soap.  6.  Wash thoroughly, paying special attention to the area where your surgery will be performed.  7.  Thoroughly rinse your body with warm water from the neck down.  8.  DO NOT shower/wash with your normal soap after using and rinsing off the CHG Soap.  9.  Pat yourself dry with a clean towel.  10.  Wear clean pajamas.            11.  Place clean sheets on your bed the night of your first shower and do not sleep with pets.  Day of Surgery  Do not apply any lotions/deodorants the morning of surgery.  Please wear clean clothes to the hospital/surgery center.   Please read over the following fact sheets that Jeffrey Carey were given: Pain Booklet, Coughing and Deep Breathing, Blood Transfusion Information, MRSA Information and Surgical Site Infection Prevention

## 2014-04-29 ENCOUNTER — Encounter (HOSPITAL_COMMUNITY)
Admission: RE | Admit: 2014-04-29 | Discharge: 2014-04-29 | Disposition: A | Discharge status: Home / Self Care | Payer: Medicare Other | Source: Ambulatory Visit | Attending: Orthopedic Surgery | Admitting: Orthopedic Surgery

## 2014-04-29 ENCOUNTER — Encounter (HOSPITAL_COMMUNITY): Payer: Self-pay

## 2014-04-29 DIAGNOSIS — Z7982 Long term (current) use of aspirin: Secondary | ICD-10-CM | POA: Diagnosis not present

## 2014-04-29 DIAGNOSIS — I10 Essential (primary) hypertension: Secondary | ICD-10-CM | POA: Diagnosis not present

## 2014-04-29 DIAGNOSIS — E785 Hyperlipidemia, unspecified: Secondary | ICD-10-CM | POA: Diagnosis not present

## 2014-04-29 DIAGNOSIS — Z01818 Encounter for other preprocedural examination: Secondary | ICD-10-CM

## 2014-04-29 DIAGNOSIS — M5023 Other cervical disc displacement, cervicothoracic region: Secondary | ICD-10-CM | POA: Diagnosis not present

## 2014-04-29 DIAGNOSIS — I509 Heart failure, unspecified: Secondary | ICD-10-CM | POA: Diagnosis not present

## 2014-04-29 DIAGNOSIS — I252 Old myocardial infarction: Secondary | ICD-10-CM | POA: Diagnosis not present

## 2014-04-29 DIAGNOSIS — K219 Gastro-esophageal reflux disease without esophagitis: Secondary | ICD-10-CM | POA: Diagnosis not present

## 2014-04-29 DIAGNOSIS — Z791 Long term (current) use of non-steroidal anti-inflammatories (NSAID): Secondary | ICD-10-CM | POA: Diagnosis not present

## 2014-04-29 HISTORY — DX: Personal history of urinary calculi: Z87.442

## 2014-04-29 LAB — CBC WITH DIFFERENTIAL/PLATELET
Basophils Absolute: 0.1 10*3/uL (ref 0.0–0.1)
Basophils Relative: 1 % (ref 0–1)
Eosinophils Absolute: 0.2 10*3/uL (ref 0.0–0.7)
Eosinophils Relative: 4 % (ref 0–5)
HEMATOCRIT: 46.9 % (ref 39.0–52.0)
Hemoglobin: 15.7 g/dL (ref 13.0–17.0)
LYMPHS PCT: 23 % (ref 12–46)
Lymphs Abs: 1.5 10*3/uL (ref 0.7–4.0)
MCH: 30.2 pg (ref 26.0–34.0)
MCHC: 33.5 g/dL (ref 30.0–36.0)
MCV: 90.2 fL (ref 78.0–100.0)
Monocytes Absolute: 0.5 10*3/uL (ref 0.1–1.0)
Monocytes Relative: 8 % (ref 3–12)
NEUTROS ABS: 4.3 10*3/uL (ref 1.7–7.7)
Neutrophils Relative %: 64 % (ref 43–77)
Platelets: 262 10*3/uL (ref 150–400)
RBC: 5.2 MIL/uL (ref 4.22–5.81)
RDW: 14 % (ref 11.5–15.5)
WBC: 6.6 10*3/uL (ref 4.0–10.5)

## 2014-04-29 LAB — COMPREHENSIVE METABOLIC PANEL
ALK PHOS: 55 U/L (ref 39–117)
ALT: 25 U/L (ref 0–53)
AST: 23 U/L (ref 0–37)
Albumin: 4.1 g/dL (ref 3.5–5.2)
Anion gap: 9 (ref 5–15)
BUN: 31 mg/dL — ABNORMAL HIGH (ref 6–23)
CO2: 25 mmol/L (ref 19–32)
Calcium: 9.4 mg/dL (ref 8.4–10.5)
Chloride: 108 mmol/L (ref 96–112)
Creatinine, Ser: 1.61 mg/dL — ABNORMAL HIGH (ref 0.50–1.35)
GFR calc Af Amer: 49 mL/min — ABNORMAL LOW (ref >90)
GFR, EST NON AFRICAN AMERICAN: 43 mL/min — AB (ref >90)
Glucose, Bld: 99 mg/dL (ref 70–99)
POTASSIUM: 4.5 mmol/L (ref 3.5–5.1)
Sodium: 142 mmol/L (ref 135–145)
Total Bilirubin: 0.5 mg/dL (ref 0.3–1.2)
Total Protein: 7.3 g/dL (ref 6.0–8.3)

## 2014-04-29 LAB — TYPE AND SCREEN
ABO/RH(D): AB POS
Antibody Screen: NEGATIVE

## 2014-04-29 LAB — URINALYSIS, ROUTINE W REFLEX MICROSCOPIC
BILIRUBIN URINE: NEGATIVE
Glucose, UA: NEGATIVE mg/dL
KETONES UR: NEGATIVE mg/dL
LEUKOCYTES UA: NEGATIVE
NITRITE: NEGATIVE
PH: 5 (ref 5.0–8.0)
PROTEIN: NEGATIVE mg/dL
Specific Gravity, Urine: 1.027 (ref 1.005–1.030)
Urobilinogen, UA: 0.2 mg/dL (ref 0.0–1.0)

## 2014-04-29 LAB — URINE MICROSCOPIC-ADD ON

## 2014-04-29 LAB — PROTIME-INR
INR: 0.97 (ref 0.00–1.49)
Prothrombin Time: 13 seconds (ref 11.6–15.2)

## 2014-04-29 LAB — ABO/RH: ABO/RH(D): AB POS

## 2014-04-29 LAB — APTT: APTT: 31 s (ref 24–37)

## 2014-04-29 LAB — SURGICAL PCR SCREEN
MRSA, PCR: NEGATIVE
Staphylococcus aureus: NEGATIVE

## 2014-04-29 MED ORDER — VANCOMYCIN HCL IN DEXTROSE 1-5 GM/200ML-% IV SOLN
1000.0000 mg | INTRAVENOUS | Status: AC
  Administered 2014-04-30: 1000 mg via INTRAVENOUS
  Filled 2014-04-29: qty 200

## 2014-04-29 MED ORDER — POVIDONE-IODINE 7.5 % EX SOLN
Freq: Once | CUTANEOUS | Status: DC
  Filled 2014-04-29: qty 118

## 2014-04-29 NOTE — Progress Notes (Signed)
I spoke with patient and informed him of arrival time of 11:00.

## 2014-04-29 NOTE — Progress Notes (Signed)
Anesthesia Chart Review:  Patient is a 68 year old male scheduled for C7-T1 ACDF tomorrow by Dr. Lynann Bologna. PAT was this morning.  History includes non-smoker, HLD, HTN, ED, GERD, nephrolithiasis, cholecystectomy. He did not report a history of CKD, but he has had a mildly elevated creatinine on multiple labs dating back over the past year.  PCP is Dr. Franne Forts, last annual exam 02/05/14.   Meds include ASA, atenolol, ibuprofen, fish oil, pravastatin.  07/29/13 EKG: NSR, occasional PVC, negative T wave in aVL.  04/29/14 CXR: There is no active cardiopulmonary disease.  Preoperative labs note. BUN/Cr 31/1.61, previously in 2015 BUN 19-28, Cr 1.30-1.49.  I'll repeat a BMET on arrival to ensure stability/no significant increase in his renal function. If stable would anticipate that he could proceed with close post-operative follow-up.  [facility] Short Stay Center/Anesthesiology Phone [phone] 04/29/2014 2:38 PM

## 2014-04-30 ENCOUNTER — Observation Stay (HOSPITAL_COMMUNITY)
Admission: RE | Admit: 2014-04-30 | Discharge: 2014-05-01 | Disposition: A | Discharge status: Home / Self Care | Payer: Medicare Other | Source: Ambulatory Visit | Attending: Orthopedic Surgery | Admitting: Orthopedic Surgery

## 2014-04-30 ENCOUNTER — Encounter (HOSPITAL_COMMUNITY)
Admission: RE | Disposition: A | Discharge status: Home / Self Care | Payer: Medicare Other | Source: Ambulatory Visit | Attending: Orthopedic Surgery

## 2014-04-30 ENCOUNTER — Inpatient Hospital Stay (HOSPITAL_COMMUNITY): Payer: Medicare Other

## 2014-04-30 ENCOUNTER — Inpatient Hospital Stay (HOSPITAL_COMMUNITY): Payer: Medicare Other | Admitting: Vascular Surgery

## 2014-04-30 ENCOUNTER — Encounter (HOSPITAL_COMMUNITY): Payer: Self-pay | Admitting: *Deleted

## 2014-04-30 ENCOUNTER — Inpatient Hospital Stay (HOSPITAL_COMMUNITY): Payer: Medicare Other | Admitting: Anesthesiology

## 2014-04-30 DIAGNOSIS — M5410 Radiculopathy, site unspecified: Secondary | ICD-10-CM

## 2014-04-30 DIAGNOSIS — Z791 Long term (current) use of non-steroidal anti-inflammatories (NSAID): Secondary | ICD-10-CM | POA: Insufficient documentation

## 2014-04-30 DIAGNOSIS — Z981 Arthrodesis status: Secondary | ICD-10-CM | POA: Diagnosis not present

## 2014-04-30 DIAGNOSIS — I252 Old myocardial infarction: Secondary | ICD-10-CM | POA: Insufficient documentation

## 2014-04-30 DIAGNOSIS — K219 Gastro-esophageal reflux disease without esophagitis: Secondary | ICD-10-CM | POA: Insufficient documentation

## 2014-04-30 DIAGNOSIS — Z7982 Long term (current) use of aspirin: Secondary | ICD-10-CM | POA: Diagnosis not present

## 2014-04-30 DIAGNOSIS — M5124 Other intervertebral disc displacement, thoracic region: Secondary | ICD-10-CM | POA: Diagnosis not present

## 2014-04-30 DIAGNOSIS — I10 Essential (primary) hypertension: Secondary | ICD-10-CM | POA: Diagnosis not present

## 2014-04-30 DIAGNOSIS — M5023 Other cervical disc displacement, cervicothoracic region: Principal | ICD-10-CM | POA: Insufficient documentation

## 2014-04-30 DIAGNOSIS — M5022 Other cervical disc displacement, mid-cervical region: Secondary | ICD-10-CM | POA: Diagnosis not present

## 2014-04-30 DIAGNOSIS — Z09 Encounter for follow-up examination after completed treatment for conditions other than malignant neoplasm: Secondary | ICD-10-CM

## 2014-04-30 DIAGNOSIS — I509 Heart failure, unspecified: Secondary | ICD-10-CM | POA: Insufficient documentation

## 2014-04-30 DIAGNOSIS — E785 Hyperlipidemia, unspecified: Secondary | ICD-10-CM | POA: Insufficient documentation

## 2014-04-30 DIAGNOSIS — M79602 Pain in left arm: Secondary | ICD-10-CM | POA: Diagnosis not present

## 2014-04-30 DIAGNOSIS — M5412 Radiculopathy, cervical region: Secondary | ICD-10-CM | POA: Diagnosis not present

## 2014-04-30 HISTORY — PX: ANTERIOR CERVICAL DECOMP/DISCECTOMY FUSION: SHX1161

## 2014-04-30 HISTORY — DX: Radiculopathy, site unspecified: M54.10

## 2014-04-30 LAB — BASIC METABOLIC PANEL
ANION GAP: 7 (ref 5–15)
BUN: 30 mg/dL — AB (ref 6–23)
CHLORIDE: 109 mmol/L (ref 96–112)
CO2: 23 mmol/L (ref 19–32)
Calcium: 8.9 mg/dL (ref 8.4–10.5)
Creatinine, Ser: 1.39 mg/dL — ABNORMAL HIGH (ref 0.50–1.35)
GFR, EST AFRICAN AMERICAN: 59 mL/min — AB (ref >90)
GFR, EST NON AFRICAN AMERICAN: 51 mL/min — AB (ref >90)
Glucose, Bld: 88 mg/dL (ref 70–99)
Potassium: 4.5 mmol/L (ref 3.5–5.1)
SODIUM: 139 mmol/L (ref 135–145)

## 2014-04-30 SURGERY — ANTERIOR CERVICAL DECOMPRESSION/DISCECTOMY FUSION 1 LEVEL
Anesthesia: General

## 2014-04-30 MED ORDER — ATENOLOL 100 MG PO TABS
100.0000 mg | ORAL_TABLET | Freq: Every day | ORAL | Status: DC
  Administered 2014-04-30: 100 mg via ORAL
  Filled 2014-04-30 (×2): qty 1

## 2014-04-30 MED ORDER — ACETAMINOPHEN 650 MG RE SUPP: 650.0000 mg | RECTAL | Status: DC | PRN

## 2014-04-30 MED ORDER — MIDAZOLAM HCL 5 MG/5ML IJ SOLN
INTRAMUSCULAR | Status: DC | PRN
  Administered 2014-04-30: 2 mg via INTRAVENOUS

## 2014-04-30 MED ORDER — GLYCOPYRROLATE 0.2 MG/ML IJ SOLN
INTRAMUSCULAR | Status: DC | PRN
  Administered 2014-04-30: 0.4 mg via INTRAVENOUS

## 2014-04-30 MED ORDER — LACTATED RINGERS IV SOLN
INTRAVENOUS | Status: DC
Start: 2014-04-30 — End: 2014-04-30
  Administered 2014-04-30 (×3): via INTRAVENOUS

## 2014-04-30 MED ORDER — FENTANYL CITRATE (PF) 250 MCG/5ML IJ SOLN
INTRAMUSCULAR | Status: AC
  Filled 2014-04-30: qty 5

## 2014-04-30 MED ORDER — DOCUSATE SODIUM 100 MG PO CAPS
100.0000 mg | ORAL_CAPSULE | Freq: Two times a day (BID) | ORAL | Status: DC
  Administered 2014-04-30: 100 mg via ORAL

## 2014-04-30 MED ORDER — 0.9 % SODIUM CHLORIDE (POUR BTL) OPTIME
TOPICAL | Status: DC | PRN
  Administered 2014-04-30: 1000 mL

## 2014-04-30 MED ORDER — ALUM & MAG HYDROXIDE-SIMETH 200-200-20 MG/5ML PO SUSP: 30.0000 mL | Freq: Four times a day (QID) | ORAL | Status: DC | PRN

## 2014-04-30 MED ORDER — ONDANSETRON HCL 4 MG/2ML IJ SOLN: 4.0000 mg | INTRAMUSCULAR | Status: DC | PRN

## 2014-04-30 MED ORDER — ARTIFICIAL TEARS OP OINT
TOPICAL_OINTMENT | OPHTHALMIC | Status: DC | PRN
  Administered 2014-04-30: 1 via OPHTHALMIC

## 2014-04-30 MED ORDER — THROMBIN 20000 UNITS EX KIT
PACK | CUTANEOUS | Status: DC | PRN
  Administered 2014-04-30: 20000 [IU] via TOPICAL

## 2014-04-30 MED ORDER — DIAZEPAM 5 MG PO TABS
5.0000 mg | ORAL_TABLET | Freq: Four times a day (QID) | ORAL | Status: DC | PRN
  Administered 2014-04-30: 5 mg via ORAL

## 2014-04-30 MED ORDER — LIDOCAINE HCL (CARDIAC) 20 MG/ML IV SOLN
INTRAVENOUS | Status: DC | PRN
  Administered 2014-04-30: 80 mg via INTRAVENOUS

## 2014-04-30 MED ORDER — HYDROMORPHONE HCL 1 MG/ML IJ SOLN
INTRAMUSCULAR | Status: AC
  Administered 2014-04-30: 1 mg via INTRAVENOUS
  Filled 2014-04-30: qty 1

## 2014-04-30 MED ORDER — PROPOFOL 10 MG/ML IV BOLUS
INTRAVENOUS | Status: AC
  Filled 2014-04-30: qty 20

## 2014-04-30 MED ORDER — OXYCODONE-ACETAMINOPHEN 5-325 MG PO TABS
ORAL_TABLET | ORAL | Status: AC
  Administered 2014-04-30: 2 via ORAL
  Filled 2014-04-30: qty 2

## 2014-04-30 MED ORDER — PRAVASTATIN SODIUM 40 MG PO TABS
40.0000 mg | ORAL_TABLET | Freq: Every day | ORAL | Status: DC
  Filled 2014-04-30: qty 1

## 2014-04-30 MED ORDER — THROMBIN 20000 UNITS EX SOLR
CUTANEOUS | Status: AC
  Filled 2014-04-30: qty 20000

## 2014-04-30 MED ORDER — ZOLPIDEM TARTRATE 5 MG PO TABS: 5.0000 mg | ORAL_TABLET | Freq: Every evening | ORAL | Status: DC | PRN

## 2014-04-30 MED ORDER — EPHEDRINE SULFATE 50 MG/ML IJ SOLN
INTRAMUSCULAR | Status: DC | PRN
  Administered 2014-04-30 (×5): 5 mg via INTRAVENOUS
  Administered 2014-04-30 (×2): 10 mg via INTRAVENOUS

## 2014-04-30 MED ORDER — OXYCODONE HCL 5 MG PO TABS: 5.0000 mg | ORAL_TABLET | Freq: Once | ORAL | Status: DC | PRN

## 2014-04-30 MED ORDER — FLEET ENEMA 7-19 GM/118ML RE ENEM: 1.0000 | ENEMA | Freq: Once | RECTAL | Status: AC | PRN

## 2014-04-30 MED ORDER — GELATIN ABSORBABLE 12-7 MM EX MISC
CUTANEOUS | Status: DC | PRN
  Administered 2014-04-30: 1

## 2014-04-30 MED ORDER — NEOSTIGMINE METHYLSULFATE 10 MG/10ML IV SOLN
INTRAVENOUS | Status: DC | PRN
  Administered 2014-04-30: 3 mg via INTRAVENOUS

## 2014-04-30 MED ORDER — FENTANYL CITRATE (PF) 100 MCG/2ML IJ SOLN
INTRAMUSCULAR | Status: DC | PRN
  Administered 2014-04-30 (×3): 50 ug via INTRAVENOUS
  Administered 2014-04-30: 100 ug via INTRAVENOUS
  Administered 2014-04-30: 50 ug via INTRAVENOUS

## 2014-04-30 MED ORDER — MENTHOL 3 MG MT LOZG: 1.0000 | LOZENGE | OROMUCOSAL | Status: DC | PRN

## 2014-04-30 MED ORDER — BUPIVACAINE-EPINEPHRINE 0.25% -1:200000 IJ SOLN
INTRAMUSCULAR | Status: DC | PRN
  Administered 2014-04-30: 30 mL

## 2014-04-30 MED ORDER — BISACODYL 5 MG PO TBEC: 5.0000 mg | DELAYED_RELEASE_TABLET | Freq: Every day | ORAL | Status: DC | PRN

## 2014-04-30 MED ORDER — SENNOSIDES-DOCUSATE SODIUM 8.6-50 MG PO TABS
1.0000 | ORAL_TABLET | Freq: Every evening | ORAL | Status: DC | PRN
  Filled 2014-04-30: qty 1

## 2014-04-30 MED ORDER — HYDROMORPHONE HCL 1 MG/ML IJ SOLN
0.2500 mg | INTRAMUSCULAR | Status: DC | PRN
  Administered 2014-04-30: 1 mg via INTRAVENOUS

## 2014-04-30 MED ORDER — ACETAMINOPHEN 325 MG PO TABS: 650.0000 mg | ORAL_TABLET | ORAL | Status: DC | PRN

## 2014-04-30 MED ORDER — MIDAZOLAM HCL 2 MG/2ML IJ SOLN
INTRAMUSCULAR | Status: AC
  Filled 2014-04-30: qty 2

## 2014-04-30 MED ORDER — SODIUM CHLORIDE 0.9 % IJ SOLN
3.0000 mL | Freq: Two times a day (BID) | INTRAMUSCULAR | Status: DC
  Administered 2014-05-01: 3 mL via INTRAVENOUS

## 2014-04-30 MED ORDER — OXYCODONE HCL 5 MG/5ML PO SOLN: 5.0000 mg | Freq: Once | ORAL | Status: DC | PRN

## 2014-04-30 MED ORDER — ROCURONIUM BROMIDE 100 MG/10ML IV SOLN
INTRAVENOUS | Status: DC | PRN
  Administered 2014-04-30: 50 mg via INTRAVENOUS

## 2014-04-30 MED ORDER — ONDANSETRON HCL 4 MG/2ML IJ SOLN
INTRAMUSCULAR | Status: DC | PRN
  Administered 2014-04-30: 4 mg via INTRAVENOUS

## 2014-04-30 MED ORDER — DIAZEPAM 5 MG PO TABS
ORAL_TABLET | ORAL | Status: AC
  Administered 2014-04-30: 5 mg via ORAL
  Filled 2014-04-30: qty 1

## 2014-04-30 MED ORDER — OXYCODONE-ACETAMINOPHEN 5-325 MG PO TABS
1.0000 | ORAL_TABLET | ORAL | Status: DC | PRN
  Administered 2014-04-30: 2 via ORAL

## 2014-04-30 MED ORDER — PHENYLEPHRINE HCL 10 MG/ML IJ SOLN
INTRAMUSCULAR | Status: DC | PRN
  Administered 2014-04-30: 120 ug via INTRAVENOUS
  Administered 2014-04-30 (×2): 80 ug via INTRAVENOUS

## 2014-04-30 MED ORDER — MORPHINE SULFATE 2 MG/ML IJ SOLN: 1.0000 mg | INTRAMUSCULAR | Status: DC | PRN

## 2014-04-30 MED ORDER — VANCOMYCIN HCL IN DEXTROSE 1-5 GM/200ML-% IV SOLN
1000.0000 mg | Freq: Once | INTRAVENOUS | Status: AC
  Administered 2014-05-01: 1000 mg via INTRAVENOUS
  Filled 2014-04-30: qty 200

## 2014-04-30 MED ORDER — SODIUM CHLORIDE 0.9 % IJ SOLN: 3.0000 mL | INTRAMUSCULAR | Status: DC | PRN

## 2014-04-30 MED ORDER — DEXAMETHASONE SODIUM PHOSPHATE 4 MG/ML IJ SOLN
INTRAMUSCULAR | Status: DC | PRN
  Administered 2014-04-30: 4 mg via INTRAVENOUS

## 2014-04-30 MED ORDER — PROPOFOL 10 MG/ML IV BOLUS
INTRAVENOUS | Status: DC | PRN
  Administered 2014-04-30: 30 mg via INTRAVENOUS
  Administered 2014-04-30: 160 mg via INTRAVENOUS

## 2014-04-30 MED ORDER — PHENOL 1.4 % MT LIQD: 1.0000 | OROMUCOSAL | Status: DC | PRN

## 2014-04-30 SURGICAL SUPPLY — 80 items
APL SKNCLS STERI-STRIP NONHPOA (GAUZE/BANDAGES/DRESSINGS) ×1
BENZOIN TINCTURE PRP APPL 2/3 (GAUZE/BANDAGES/DRESSINGS) ×3 IMPLANT
BIT DRILL NEURO 2X3.1 SFT TUCH (MISCELLANEOUS) ×1 IMPLANT
BIT DRILL SRG 14X2.2XFLT CHK (BIT) IMPLANT
BIT DRL SRG 14X2.2XFLT CHK (BIT) ×1
BLADE SURG 15 STRL LF DISP TIS (BLADE) ×1 IMPLANT
BLADE SURG 15 STRL SS (BLADE) ×3
BLADE SURG ROTATE 9660 (MISCELLANEOUS) ×3 IMPLANT
BUR MATCHSTICK NEURO 3.0 LAGG (BURR) IMPLANT
CARTRIDGE OIL MAESTRO DRILL (MISCELLANEOUS) ×1 IMPLANT
CLOSURE STERI-STRIP 1/2X4 (GAUZE/BANDAGES/DRESSINGS) ×1
CLOSURE WOUND 1/2 X4 (GAUZE/BANDAGES/DRESSINGS) ×1
CLSR STERI-STRIP ANTIMIC 1/2X4 (GAUZE/BANDAGES/DRESSINGS) ×1 IMPLANT
CORDS BIPOLAR (ELECTRODE) ×3 IMPLANT
COVER MAYO STAND STRL (DRAPES) ×2 IMPLANT
COVER SURGICAL LIGHT HANDLE (MISCELLANEOUS) ×3 IMPLANT
CRADLE DONUT ADULT HEAD (MISCELLANEOUS) ×3 IMPLANT
DIFFUSER DRILL AIR PNEUMATIC (MISCELLANEOUS) ×3 IMPLANT
DRAIN JACKSON RD 7FR 3/32 (WOUND CARE) IMPLANT
DRAPE C-ARM 42X72 X-RAY (DRAPES) ×3 IMPLANT
DRAPE POUCH INSTRU U-SHP 10X18 (DRAPES) ×3 IMPLANT
DRAPE SURG 17X23 STRL (DRAPES) ×9 IMPLANT
DRILL BIT SKYLINE 14MM (BIT) ×3
DRILL NEURO 2X3.1 SOFT TOUCH (MISCELLANEOUS) ×3
DURAPREP 26ML APPLICATOR (WOUND CARE) ×3 IMPLANT
ELECT COATED BLADE 2.86 ST (ELECTRODE) ×3 IMPLANT
ELECT REM PT RETURN 9FT ADLT (ELECTROSURGICAL) ×3
ELECTRODE REM PT RTRN 9FT ADLT (ELECTROSURGICAL) ×1 IMPLANT
EVACUATOR SILICONE 100CC (DRAIN) IMPLANT
GAUZE SPONGE 4X4 12PLY STRL (GAUZE/BANDAGES/DRESSINGS) ×3 IMPLANT
GAUZE SPONGE 4X4 16PLY XRAY LF (GAUZE/BANDAGES/DRESSINGS) ×3 IMPLANT
GLOVE BIO SURGEON STRL SZ7 (GLOVE) ×3 IMPLANT
GLOVE BIO SURGEON STRL SZ7.5 (GLOVE) ×2 IMPLANT
GLOVE BIO SURGEON STRL SZ8 (GLOVE) ×3 IMPLANT
GLOVE BIOGEL PI IND STRL 7.0 (GLOVE) ×2 IMPLANT
GLOVE BIOGEL PI IND STRL 8 (GLOVE) ×1 IMPLANT
GLOVE BIOGEL PI INDICATOR 7.0 (GLOVE) ×4
GLOVE BIOGEL PI INDICATOR 8 (GLOVE) ×2
GOWN STRL REUS W/ TWL LRG LVL3 (GOWN DISPOSABLE) ×1 IMPLANT
GOWN STRL REUS W/ TWL XL LVL3 (GOWN DISPOSABLE) ×1 IMPLANT
GOWN STRL REUS W/TWL LRG LVL3 (GOWN DISPOSABLE) ×3
GOWN STRL REUS W/TWL XL LVL3 (GOWN DISPOSABLE) ×3
IMPL S ENDOSKEL TC 7 ODEG (Orthopedic Implant) IMPLANT
IMPLANT S ENDOSKEL TC 7 ODEG (Orthopedic Implant) ×3 IMPLANT
IV CATH 14GX2 1/4 (CATHETERS) ×3 IMPLANT
KIT BASIN OR (CUSTOM PROCEDURE TRAY) ×3 IMPLANT
KIT ROOM TURNOVER OR (KITS) ×3 IMPLANT
MANIFOLD NEPTUNE II (INSTRUMENTS) ×3 IMPLANT
MARKER SKIN DUAL TIP RULER LAB (MISCELLANEOUS) ×2 IMPLANT
NDL SPNL 20GX3.5 QUINCKE YW (NEEDLE) ×1 IMPLANT
NEEDLE 27GAX1X1/2 (NEEDLE) ×3 IMPLANT
NEEDLE SPNL 20GX3.5 QUINCKE YW (NEEDLE) ×3 IMPLANT
NS IRRIG 1000ML POUR BTL (IV SOLUTION) ×3 IMPLANT
OIL CARTRIDGE MAESTRO DRILL (MISCELLANEOUS) ×3
PACK ORTHO CERVICAL (CUSTOM PROCEDURE TRAY) ×3 IMPLANT
PAD ARMBOARD 7.5X6 YLW CONV (MISCELLANEOUS) ×6 IMPLANT
PATTIES SURGICAL .5 X.5 (GAUZE/BANDAGES/DRESSINGS) IMPLANT
PATTIES SURGICAL .5 X1 (DISPOSABLE) IMPLANT
PIN DISTRACTION 14 (PIN) ×4 IMPLANT
PLATE ONE LEVEL SKYLINE 14MM (Plate) ×2 IMPLANT
PUTTY BONE DBX 2.5 MIS (Bone Implant) ×2 IMPLANT
SCREW VAR SELF TAP SKYLINE 14M (Screw) ×8 IMPLANT
SPONGE GAUZE 4X4 12PLY STER LF (GAUZE/BANDAGES/DRESSINGS) ×2 IMPLANT
SPONGE INTESTINAL PEANUT (DISPOSABLE) ×3 IMPLANT
SPONGE SURGIFOAM ABS GEL 100 (HEMOSTASIS) IMPLANT
STRIP CLOSURE SKIN 1/2X4 (GAUZE/BANDAGES/DRESSINGS) ×2 IMPLANT
SURGIFLO TRUKIT (HEMOSTASIS) IMPLANT
SUT MNCRL AB 4-0 PS2 18 (SUTURE) IMPLANT
SUT SILK 4 0 (SUTURE)
SUT SILK 4-0 18XBRD TIE 12 (SUTURE) IMPLANT
SUT VIC AB 2-0 CT2 18 VCP726D (SUTURE) ×3 IMPLANT
SYR BULB IRRIGATION 50ML (SYRINGE) ×3 IMPLANT
SYR CONTROL 10ML LL (SYRINGE) ×6 IMPLANT
TAPE CLOTH 4X10 WHT NS (GAUZE/BANDAGES/DRESSINGS) ×3 IMPLANT
TAPE CLOTH SURG 4X10 WHT LF (GAUZE/BANDAGES/DRESSINGS) ×2 IMPLANT
TAPE UMBILICAL COTTON 1/8X30 (MISCELLANEOUS) ×3 IMPLANT
TOWEL OR 17X24 6PK STRL BLUE (TOWEL DISPOSABLE) ×3 IMPLANT
TOWEL OR 17X26 10 PK STRL BLUE (TOWEL DISPOSABLE) ×3 IMPLANT
WATER STERILE IRR 1000ML POUR (IV SOLUTION) ×3 IMPLANT
YANKAUER SUCT BULB TIP NO VENT (SUCTIONS) ×3 IMPLANT

## 2014-04-30 NOTE — Anesthesia Postprocedure Evaluation (Signed)
Anesthesia Post Note  Patient: Jeffrey Carey  Procedure(s) Performed: Procedure(s) (LRB): ANTERIOR CERVICAL DECOMPRESSION/DISCECTOMY FUSION 1 LEVEL (N/A)  Anesthesia type: General  Patient location: PACU  Post pain: Pain level controlled  Post assessment: Post-op Vital signs reviewed  Last Vitals: BP 124/70 mmHg  Pulse 66  Temp(Src) 36.6 C (Oral)  Resp 16  Ht 5\' 11"  (1.803 m)  Wt 207 lb 14.4 oz (94.303 kg)  BMI 29.01 kg/m2  SpO2 96%  Post vital signs: Reviewed  Level of consciousness: sedated  Complications: No apparent anesthesia complications

## 2014-04-30 NOTE — Anesthesia Procedure Notes (Signed)
Procedure Name: Intubation Date/Time: 04/30/2014 1:48 PM Performed by: Shirlyn Goltz Pre-anesthesia Checklist: Patient identified, Emergency Drugs available, Suction available and Patient being monitored Patient Re-evaluated:Patient Re-evaluated prior to inductionOxygen Delivery Method: Circle system utilized Preoxygenation: Pre-oxygenation with 100% oxygen Intubation Type: IV induction Ventilation: Mask ventilation without difficulty and Oral airway inserted - appropriate to patient size Laryngoscope Size: Mac and 3 Grade View: Grade I Tube type: Oral Tube size: 7.5 mm Number of attempts: 1 Airway Equipment and Method: Stylet Placement Confirmation: ETT inserted through vocal cords under direct vision,  positive ETCO2 and breath sounds checked- equal and bilateral Secured at: 23 cm Tube secured with: Tape Dental Injury: Teeth and Oropharynx as per pre-operative assessment

## 2014-04-30 NOTE — H&P (Signed)
PREOPERATIVE H&P  Chief Complaint: left arm pain  HPI: Jeffrey Carey is a 69 y.o. male who presents with ongoing pain in the left arm  MRI reveals severe NF stenosis C7/T1 on left  Patient has failed multiple forms of conservative care and continues to have pain (see office notes for additional details regarding the patient's full course of treatment)  Past Medical History  Diagnosis Date  . Hyperlipidemia   . Hypertension   . ED (erectile dysfunction)   . Vitamin D deficiency   . Hypogonadism male   . Obesity   . History of kidney stones   . GERD (gastroesophageal reflux disease)     hx years ago   Past Surgical History  Procedure Laterality Date  . Laparoscopic cholecystectomy  10/2010  . Hammer toe surgery  2012    left foot, 3rd toe   History   Social History  . Marital Status: Married    Spouse Name: N/A  . Number of Children: N/A  . Years of Education: N/A   Social History Main Topics  . Smoking status: Never Smoker   . Smokeless tobacco: Never Used  . Alcohol Use: No  . Drug Use: No  . Sexual Activity: Not on file   Other Topics Concern  . Not on file   Social History Narrative   Family History  Problem Relation Age of Onset  . Colon cancer Mother 56  . Cancer Mother     ovarian  . Colon cancer Father 61  . Diabetes Father   . Cancer Father     colon,prostate  . Alzheimer's disease Father   . Stomach cancer Neg Hx    Allergies  Allergen Reactions  . Ceftin [Cefuroxime Axetil] Hives  . Peanut-Containing Drug Products Other (See Comments)    Break out on nose    Prior to Admission medications   Medication Sig Start Date End Date Taking? Authorizing Provider  aspirin 81 MG tablet Take 81 mg by mouth at bedtime.    Yes Historical Provider, MD  atenolol (TENORMIN) 100 MG tablet TAKE 1 TABLET A DAY FOR BLOOD PRESSURE 01/02/14  Yes [name], MD  Cholecalciferol (HM VITAMIN D3) 2000 UNITS CAPS Take 2,000 Units by mouth at bedtime.     Yes Historical Provider, MD  CINNAMON PO Take 1,000 mg by mouth daily.   Yes Historical Provider, MD  ibuprofen (ADVIL,MOTRIN) 200 MG tablet Take 400 mg by mouth every 6 (six) hours as needed for moderate pain.   Yes Historical Provider, MD  Magnesium 250 MG TABS Take 250 mg by mouth daily.   Yes Historical Provider, MD  Omega-3 Fatty Acids (FISH OIL PO) Take 1,200 mg by mouth at bedtime.    Yes Historical Provider, MD  pravastatin (PRAVACHOL) 40 MG tablet TAKE 1 TABLET (40 MG TOTAL) BY MOUTH DAILY. 01/06/14  Yes [name], MD  predniSONE (DELTASONE) 20 MG tablet 1 tab 3 x day for 2 days, then 1 tab 2 x day for 2 days, then 1 tab 1 x day for 3 days Patient not taking: Reported on 04/25/2014 02/24/14   [name], MD     All other systems have been reviewed and were otherwise negative with the exception of those mentioned in the HPI and as above.  Physical Exam: There were no vitals filed for this visit.  General: Alert, no acute distress Cardiovascular: No pedal edema Respiratory: No cyanosis, no use of accessory musculature Skin: No lesions in  the area of chief complaint Neurologic: Sensation intact distally Psychiatric: Patient is competent for consent with normal mood and affect Lymphatic: No axillary or cervical lymphadenopathy  MUSCULOSKELETAL: + spurling on left  Assessment/Plan: Left arm pain Plan for Procedure(s): ANTERIOR CERVICAL DECOMPRESSION/DISCECTOMY FUSION 1 LEVEL   Sinclair Ship, MD 04/30/2014 8:04 AM

## 2014-04-30 NOTE — Progress Notes (Signed)
Orthopedic Tech Progress Note Patient Details:  Jeffrey Carey 09/15/1945 [id number] Patient already has soft collar on. Patient ID: Jeffrey Carey, male   DOB: 13-Dec-1945, 69 y.o.   MRN: [id number]   Jeffrey Carey 04/30/2014, 6:58 PM

## 2014-04-30 NOTE — Anesthesia Preprocedure Evaluation (Signed)
Anesthesia Evaluation  Patient identified by MRN, date of birth, ID band Patient awake    Reviewed: Allergy & Precautions, NPO status , Patient's Chart, lab work & pertinent test results, reviewed documented beta blocker date and time   History of Anesthesia Complications Negative for: history of anesthetic complications  Airway Mallampati: II  TM Distance: >3 FB Neck ROM: Full    Dental  (+) Teeth Intact   Pulmonary neg pulmonary ROS,  breath sounds clear to auscultation        Cardiovascular hypertension, Pt. on medications and Pt. on home beta blockers - angina- Past MI and - CHF Rhythm:Regular     Neuro/Psych negative neurological ROS  negative psych ROS   GI/Hepatic negative GI ROS, Neg liver ROS,   Endo/Other  negative endocrine ROS  Renal/GU negative Renal ROS     Musculoskeletal negative musculoskeletal ROS (+)   Abdominal   Peds  Hematology negative hematology ROS (+)   Anesthesia Other Findings   Reproductive/Obstetrics                             Anesthesia Physical Anesthesia Plan  ASA: II  Anesthesia Plan: General   Post-op Pain Management:    Induction: Intravenous  Airway Management Planned: Oral ETT  Additional Equipment: None  Intra-op Plan:   Post-operative Plan: Extubation in OR  Informed Consent: I have reviewed the patients History and Physical, chart, labs and discussed the procedure including the risks, benefits and alternatives for the proposed anesthesia with the patient or authorized representative who has indicated his/her understanding and acceptance.   Dental advisory given  Plan Discussed with: CRNA and Surgeon  Anesthesia Plan Comments:         Anesthesia Quick Evaluation

## 2014-04-30 NOTE — Progress Notes (Signed)
ANTIBIOTIC CONSULT NOTE - INITIAL  Pharmacy Consult for vancomycin one dose post op Indication: surgical prophylaxis  Allergies  Allergen Reactions  . Ceftin [Cefuroxime Axetil] Hives  . Peanut-Containing Drug Products Other (See Comments)    Break out on nose     Patient Measurements: Height: 5\' 11"  (180.3 cm) Weight: 207 lb 14.4 oz (94.303 kg) IBW/kg (Calculated) : 75.3   Vital Signs: Temp: 97.8 F (36.6 C) (04/27 1632) Temp Source: Oral (04/27 1109) BP: 112/69 mmHg (04/27 1800) Pulse Rate: 73 (04/27 1800) Intake/Output from previous day:   Intake/Output from this shift: Total I/O In: 1300 [I.V.:1300] Out: 50 [Urine:50]  Labs:  Recent Labs  04/29/14 0930 04/30/14 1147  WBC 6.6  --   HGB 15.7  --   PLT 262  --   CREATININE 1.61* 1.39*   Estimated Creatinine Clearance: 60.5 mL/min (by C-G formula based on Cr of 1.39). No results for input(s): VANCOTROUGH, VANCOPEAK, VANCORANDOM, GENTTROUGH, GENTPEAK, GENTRANDOM, TOBRATROUGH, TOBRAPEAK, TOBRARND, AMIKACINPEAK, AMIKACINTROU, AMIKACIN in the last 72 hours.   Microbiology: Recent Results (from the past 720 hour(s))  Surgical pcr screen     Status: None   Collection Time: 04/29/14  9:23 AM  Result Value Ref Range Status   MRSA, PCR NEGATIVE NEGATIVE Final   Staphylococcus aureus NEGATIVE NEGATIVE Final    Comment:        The Xpert SA Assay (FDA approved for NASAL specimens in patients over 15 years of age), is one component of a comprehensive surveillance program.  Test performance has been validated by [facility] for patients greater than or equal to 26 year old. It is not intended to diagnose infection nor to guide or monitor treatment.     Medical History: Past Medical History  Diagnosis Date  . Hyperlipidemia   . Hypertension   . ED (erectile dysfunction)   . Vitamin D deficiency   . Hypogonadism male   . Obesity   . History of kidney stones   . GERD (gastroesophageal reflux disease)    hx years ago    Medications:  Prescriptions prior to admission  Medication Sig Dispense Refill Last Dose  . aspirin 81 MG tablet Take 81 mg by mouth at bedtime.    04/20/14  . atenolol (TENORMIN) 100 MG tablet TAKE 1 TABLET A DAY FOR BLOOD PRESSURE 100 tablet 4 04/29/2014 at 2230  . Cholecalciferol (HM VITAMIN D3) 2000 UNITS CAPS Take 2,000 Units by mouth at bedtime.    04/20/14  . CINNAMON PO Take 1,000 mg by mouth daily.   04/20/14  . Magnesium 250 MG TABS Take 250 mg by mouth daily.   04/20/14  . Omega-3 Fatty Acids (FISH OIL PO) Take 1,200 mg by mouth at bedtime.    04/20/14  . pravastatin (PRAVACHOL) 40 MG tablet TAKE 1 TABLET (40 MG TOTAL) BY MOUTH DAILY. 90 tablet 2 04/20/14  . [DISCONTINUED] ibuprofen (ADVIL,MOTRIN) 200 MG tablet Take 400 mg by mouth every 6 (six) hours as needed for moderate pain.   04/20/14  . predniSONE (DELTASONE) 20 MG tablet 1 tab 3 x day for 2 days, then 1 tab 2 x day for 2 days, then 1 tab 1 x day for 3 days (Patient not taking: Reported on 04/25/2014) 13 tablet 0 Completed Course at Unknown time   Assessment: 69 yo man to receive vanc for one dose post op.  He received one gram of vanc at 13:40.  CrCl ~ 50 ml.min.  Goal of Therapy:  Prevention of  infection  Plan:  Vancomycin 1gm ~12 hours after initial dose.  Excell Seltzer Poteet 04/30/2014,6:45 PM

## 2014-04-30 NOTE — Plan of Care (Signed)
Problem: Consults Goal: Diagnosis - Spinal Surgery Outcome: Completed/Met Date Met:  04/30/14 Lumbar Laminectomy (Complex)

## 2014-04-30 NOTE — Transfer of Care (Signed)
Immediate Anesthesia Transfer of Care Note  Patient: Jeffrey Carey  Procedure(s) Performed: Procedure(s) with comments: ANTERIOR CERVICAL DECOMPRESSION/DISCECTOMY FUSION 1 LEVEL (N/A) - Anterior cervical decompression fusion, cervical 7-thoracic 1 with instrumentation and allograft  Patient Location: PACU  Anesthesia Type:General  Level of Consciousness: awake, alert , oriented and patient cooperative  Airway & Oxygen Therapy: Patient Spontanous Breathing and Patient connected to nasal cannula oxygen  Post-op Assessment: Report given to RN, Post -op Vital signs reviewed and stable, Patient moving all extremities and Patient moving all extremities X 4  Post vital signs: Reviewed and stable  Last Vitals:  Filed Vitals:   04/30/14 1109  BP: 150/84  Pulse: 61  Temp: 36.7 C  Resp: 18    Complications: No apparent anesthesia complications

## 2014-05-01 ENCOUNTER — Encounter (HOSPITAL_COMMUNITY): Payer: Self-pay | Admitting: Orthopedic Surgery

## 2014-05-01 DIAGNOSIS — M5023 Other cervical disc displacement, cervicothoracic region: Secondary | ICD-10-CM | POA: Diagnosis not present

## 2014-05-01 DIAGNOSIS — E785 Hyperlipidemia, unspecified: Secondary | ICD-10-CM | POA: Diagnosis not present

## 2014-05-01 DIAGNOSIS — K219 Gastro-esophageal reflux disease without esophagitis: Secondary | ICD-10-CM | POA: Diagnosis not present

## 2014-05-01 DIAGNOSIS — Z791 Long term (current) use of non-steroidal anti-inflammatories (NSAID): Secondary | ICD-10-CM | POA: Diagnosis not present

## 2014-05-01 DIAGNOSIS — Z7982 Long term (current) use of aspirin: Secondary | ICD-10-CM | POA: Diagnosis not present

## 2014-05-01 DIAGNOSIS — I509 Heart failure, unspecified: Secondary | ICD-10-CM | POA: Diagnosis not present

## 2014-05-01 DIAGNOSIS — I252 Old myocardial infarction: Secondary | ICD-10-CM | POA: Diagnosis not present

## 2014-05-01 DIAGNOSIS — I10 Essential (primary) hypertension: Secondary | ICD-10-CM | POA: Diagnosis not present

## 2014-05-01 MED FILL — Thrombin For Soln 20000 Unit: CUTANEOUS | Qty: 1 | Status: AC

## 2014-05-01 NOTE — Progress Notes (Signed)
    Patient doing well Left arm pain is resolved No neck pain   Physical Exam: Filed Vitals:   05/01/14 0400  BP: 111/69  Pulse: 74  Temp: 98.6 F (37 C)  Resp: 16   Collar we--applied Dressing in place NVI  POD #1 s/p C7-T1 ACDF with resolved left arm pain  - encourage ambulation - Percocet for pain, Valium for muscle spasms - d/c home today, f/u 2 weeks

## 2014-05-01 NOTE — Progress Notes (Signed)
Patient alert and oriented, mae's well, voiding adequate amount of urine, swallowing without difficulty, no c/o pain. Patient discharged home with family. Script and discharged instructions given to patient. Patient and family stated understanding of d/c instructions given and has an appointment with MD. 

## 2014-05-01 NOTE — Progress Notes (Signed)
UR completed 

## 2014-05-01 NOTE — Op Note (Signed)
NAME:  EVERSON, Jeffrey Carey NO.:  [id number]  MEDICAL RECORD NO.:  [id number]  LOCATION:  3C05C                        FACILITY:  [facility]  PHYSICIAN:  Jeffrey Bob, MD      DATE OF BIRTH:  1945/08/09  DATE OF PROCEDURE:  04/30/2014                              OPERATIVE REPORT   PREOPERATIVE DIAGNOSES: 1. Left-sided C8 radiculopathy. 2. Left-sided C7-T1 disk herniation compressing the left C8 nerve.  POSTOPERATIVE DIAGNOSES: 1. Left-sided C8 radiculopathy. 2. Left-sided C7-T1 disk herniation compressing the left C8 nerve.  PROCEDURES: 1. Anterior cervical decompression and fusion, C7-T1. 2. Placement of anterior instrumentation, C7-T1. 3. Insertion of interbody device x1 (7-mm Titan tightened     intervertebral spacer). 4. Use of morselized allograft. 5. Intraoperative use of fluoroscopy.  SURGEON:  Jeffrey Bob, MD  ASSISTANTS:  Jeffrey Holm, PA-C.  ANESTHESIA:  General endotracheal anesthesia.  COMPLICATIONS:  None.  DISPOSITION:  Stable.  ESTIMATED BLOOD LOSS:  Minimal.  INDICATIONS FOR SURGERY:  Briefly, Jeffrey Carey is a pleasant 69 year old male, who did present to me with severe pain in his left arm.  I did review an MRI, which was notable for multiple findings throughout the cervical spine from C3 all the way down the T1.  Of note, the location of the patient's pain was very much consistent with left C8 radiculopathy, and severe left-sided neural foraminal stenosis was noted at C7-T1.  We, therefore, did discuss proceeding with the procedure reflected above.  The patient did clearly understand the risks and benefits of surgery, including the risk of adjacent segment disease in the future.  OPERATIVE DETAILS:  On April 30, 2014, the patient was brought to Surgery and general endotracheal anesthesia was administered.  The patient was placed supine on the hospital bed.  The patient's arms were secured to the sides and all bony prominences were  meticulously padded. The neck was prepped and draped and gently extended.  A time-out was performed and a left-sided transverse incision was made.  A Alben Deeds approach was used and the anterior spine was noted.  The appropriate operative level was confirmed.  I then placed Caspar pins into the C6 and C7 vertebral bodies after placing a self-retaining retractor.  I then performed a standard C7-T1 intervertebral diskectomy. The posterior longitudinal ligament was identified and entered using a nerve hook.  I then performed a neural foraminal decompression on both the right and on the left sides.  Of note, on the left, there was noted to be a very large herniated intervertebral disk into the foramen, clearly compressing the exiting C8 nerve.  I was able to fully decompress the exiting C8 nerve.  The endplates were then prepared and the appropriate-sized interbody spacer was packed with DBX mix and tamped into position.  I was pleased with the Press-Fit of the implant. I then chose a 14-mm anterior cervical plate, which was placed over the anterior spine.  The 14-mm screws were placed, two in each vertebral body at C7 and T1.  The screws were then locked to the plate.  I was pleased with the fluoroscopic imaging at the completion of the procedure.  The wound was then copiously irrigated.  The wound was then explored for any undue bleeding and there was none encountered.  The wound was then closed in layers using 2-0 Vicryl followed by 3-0 Monocryl.  Benzoin and Steri-Strips were applied followed by sterile dressing.  All instrument counts were correct at the termination of the procedure.  Of note, Jeffrey Carey was my assistant throughout surgery, and did aid in retraction, suctioning, and closure.     Jeffrey Bob, MD     MD/MEDQ  D:  04/30/2014  T:  05/01/2014  Job:  [id number]  cc:   [name], M.D.

## 2014-05-01 NOTE — Progress Notes (Signed)
Orthopedic Tech Progress Note Patient Details:  Jeffrey Carey 13-Jul-1945 [id number]  Ortho Devices Type of Ortho Device: [place] cervical collar Ortho Device/Splint Interventions: Ordered   Asia Mellody Memos 05/01/2014, 7:24 AM

## 2014-05-04 DIAGNOSIS — M542 Cervicalgia: Secondary | ICD-10-CM | POA: Diagnosis not present

## 2014-05-14 DIAGNOSIS — M5022 Other cervical disc displacement, mid-cervical region: Secondary | ICD-10-CM | POA: Diagnosis not present

## 2014-05-14 NOTE — Discharge Summary (Signed)
Patient ID: Jeffrey Carey MRN: [id number] DOB/AGE: [birth year] 69 y.o.  Admit date: 04/30/2014 Discharge date: 05/01/2014  Admission Diagnoses:  Active Problems:   Radiculopathy   Discharge Diagnoses:  Same  Past Medical History  Diagnosis Date  . Hyperlipidemia   . Hypertension   . ED (erectile dysfunction)   . Vitamin D deficiency   . Hypogonadism male   . Obesity   . History of kidney stones   . GERD (gastroesophageal reflux disease)     hx years ago    Surgeries: Procedure(s): ANTERIOR CERVICAL DECOMPRESSION/DISCECTOMY FUSION 1 LEVEL C7-T1 on 04/30/2014   Consultants:   None Discharged Condition: Improved  Hospital Course: Jeffrey Carey is an 69 y.o. male who was admitted 04/30/2014 for operative treatment of radiculopathy. Patient has severe unremitting pain that affects sleep, daily activities, and work/hobbies. After pre-op clearance the patient was taken to the operating room on 04/30/2014 and underwent  Procedure(s): ANTERIOR CERVICAL DECOMPRESSION/DISCECTOMY FUSION 1 LEVEL C7-T1.    Patient was given perioperative antibiotics:  Anti-infectives    Start     Dose/Rate Route Frequency Ordered Stop   05/01/14 0200  vancomycin (VANCOCIN) IVPB 1000 mg/200 mL premix     1,000 mg 200 mL/hr over 60 Minutes Intravenous  Once 04/30/14 1847 05/01/14 0150   04/30/14 0600  vancomycin (VANCOCIN) IVPB 1000 mg/200 mL premix     1,000 mg 200 mL/hr over 60 Minutes Intravenous On call to O.R. 04/29/14 1420 04/30/14 1450       Patient was given sequential compression devices, early ambulation to prevent DVT.  Patient benefited maximally from hospital stay and there were no complications.    Recent vital signs: BP 111/69 mmHg  Pulse 74  Temp(Src) 98.6 F (37 C) (Oral)  Resp 16  Ht 5\' 11"  (1.803 m)  Wt 94.303 kg (207 lb 14.4 oz)  BMI 29.01 kg/m2  SpO2 95%  Discharge Medications:     Medication List    STOP taking these medications        predniSONE 20 MG tablet   Commonly known as:  DELTASONE      TAKE these medications        aspirin 81 MG tablet  Take 81 mg by mouth at bedtime.     atenolol 100 MG tablet  Commonly known as:  TENORMIN  TAKE 1 TABLET A DAY FOR BLOOD PRESSURE     CINNAMON PO  Take 1,000 mg by mouth daily.     FISH OIL PO  Take 1,200 mg by mouth at bedtime.     HM VITAMIN D3 2000 UNITS Caps  Generic drug:  Cholecalciferol  Take 2,000 Units by mouth at bedtime.     Magnesium 250 MG Tabs  Take 250 mg by mouth daily.     pravastatin 40 MG tablet  Commonly known as:  PRAVACHOL  TAKE 1 TABLET (40 MG TOTAL) BY MOUTH DAILY.        Diagnostic Studies: Dg Chest 2 View  04/29/2014   CLINICAL DATA:  Preoperative exam prior anterior cervical fusion  EXAM: CHEST  2 VIEW  COMPARISON:  PA and lateral chest x-ray of November 22, 2010  FINDINGS: The lungs are adequately inflated and clear. The heart and pulmonary vascularity are normal. The mediastinum is normal in width. There is no pleural effusion. The trachea is midline. The bony thorax is unremarkable.  IMPRESSION: There is no active cardiopulmonary disease.   Electronically Signed   By: David  Martinique M.D.  On: 04/29/2014 11:10   Dg Cervical Spine 1 View  04/30/2014   CLINICAL DATA:  C7-T1 ACDF.  EXAM: DG C-ARM 61-120 MIN; DG CERVICAL SPINE - 1 VIEW  TECHNIQUE: Two images from portable C-arm radiography obtained in the operating room were submitted.  CONTRAST:  None  FLUOROSCOPY TIME:  Radiation Exposure Index (as provided by the fluoroscopic device):  If the device does not provide the exposure index:  Fluoroscopy Time (in minutes and seconds):  42 seconds  Number of Acquired Images:  2  COMPARISON:  03/20/2014.  FINDINGS: Images show anterior sideplate and screw device at the C7-T1 level with inter mean bone graft material in the C7-T1 disc space. The alignment appears anatomic. No complications identified.  IMPRESSION: 1. Status post ACDF of C7-T1.   Electronically Signed   By:  Kerby Moors M.D.   On: 04/30/2014 17:04   Dg C-arm 1-60 Min  04/30/2014   CLINICAL DATA:  C7-T1 ACDF.  EXAM: DG C-ARM 61-120 MIN; DG CERVICAL SPINE - 1 VIEW  TECHNIQUE: Two images from portable C-arm radiography obtained in the operating room were submitted.  CONTRAST:  None  FLUOROSCOPY TIME:  Radiation Exposure Index (as provided by the fluoroscopic device):  If the device does not provide the exposure index:  Fluoroscopy Time (in minutes and seconds):  42 seconds  Number of Acquired Images:  2  COMPARISON:  03/20/2014.  FINDINGS: Images show anterior sideplate and screw device at the C7-T1 level with inter mean bone graft material in the C7-T1 disc space. The alignment appears anatomic. No complications identified.  IMPRESSION: 1. Status post ACDF of C7-T1.   Electronically Signed   By: Kerby Moors M.D.   On: 04/30/2014 17:04    Disposition: 01-Home or Self Care   POD #1 s/p C7-T1 ACDF with resolved left arm pain  - encourage ambulation - Percocet for pain, Valium for muscle spasms -Written scripts for pain signed and in chart -D/C instructions sheet printed and in chart -D/C today  -F/U in office 2 weeks   Signed: Justice Britain 05/14/2014, 1:17 PM

## 2014-05-24 DIAGNOSIS — E66811 Obesity, class 1: Secondary | ICD-10-CM | POA: Insufficient documentation

## 2014-05-24 DIAGNOSIS — K219 Gastro-esophageal reflux disease without esophagitis: Secondary | ICD-10-CM | POA: Insufficient documentation

## 2014-05-24 DIAGNOSIS — E669 Obesity, unspecified: Secondary | ICD-10-CM | POA: Insufficient documentation

## 2014-05-28 ENCOUNTER — Encounter (INDEPENDENT_AMBULATORY_CARE_PROVIDER_SITE_OTHER): Payer: Self-pay

## 2014-05-28 ENCOUNTER — Encounter: Payer: Self-pay | Admitting: Internal Medicine

## 2014-05-28 ENCOUNTER — Ambulatory Visit (INDEPENDENT_AMBULATORY_CARE_PROVIDER_SITE_OTHER): Payer: Medicare Other | Admitting: Internal Medicine

## 2014-05-28 VITALS — BP 114/76 | HR 60 | Temp 97.8°F | Resp 16 | Ht 70.0 in | Wt 215.4 lb

## 2014-05-28 DIAGNOSIS — R7309 Other abnormal glucose: Secondary | ICD-10-CM | POA: Diagnosis not present

## 2014-05-28 DIAGNOSIS — Z79899 Other long term (current) drug therapy: Secondary | ICD-10-CM | POA: Diagnosis not present

## 2014-05-28 DIAGNOSIS — E559 Vitamin D deficiency, unspecified: Secondary | ICD-10-CM | POA: Diagnosis not present

## 2014-05-28 DIAGNOSIS — R7303 Prediabetes: Secondary | ICD-10-CM

## 2014-05-28 DIAGNOSIS — E782 Mixed hyperlipidemia: Secondary | ICD-10-CM | POA: Diagnosis not present

## 2014-05-28 DIAGNOSIS — I10 Essential (primary) hypertension: Secondary | ICD-10-CM

## 2014-05-28 DIAGNOSIS — K219 Gastro-esophageal reflux disease without esophagitis: Secondary | ICD-10-CM

## 2014-05-28 LAB — CBC WITH DIFFERENTIAL/PLATELET
BASOS ABS: 0.1 10*3/uL (ref 0.0–0.1)
Basophils Relative: 1 % (ref 0–1)
EOS ABS: 0.4 10*3/uL (ref 0.0–0.7)
Eosinophils Relative: 5 % (ref 0–5)
HCT: 44 % (ref 39.0–52.0)
Hemoglobin: 15 g/dL (ref 13.0–17.0)
LYMPHS ABS: 2 10*3/uL (ref 0.7–4.0)
Lymphocytes Relative: 28 % (ref 12–46)
MCH: 30.2 pg (ref 26.0–34.0)
MCHC: 34.1 g/dL (ref 30.0–36.0)
MCV: 88.5 fL (ref 78.0–100.0)
MONOS PCT: 6 % (ref 3–12)
MPV: 8.8 fL (ref 8.6–12.4)
Monocytes Absolute: 0.4 10*3/uL (ref 0.1–1.0)
Neutro Abs: 4.2 10*3/uL (ref 1.7–7.7)
Neutrophils Relative %: 60 % (ref 43–77)
Platelets: 264 10*3/uL (ref 150–400)
RBC: 4.97 MIL/uL (ref 4.22–5.81)
RDW: 14.8 % (ref 11.5–15.5)
WBC: 7 10*3/uL (ref 4.0–10.5)

## 2014-05-28 LAB — BASIC METABOLIC PANEL WITH GFR
BUN: 19 mg/dL (ref 6–23)
CALCIUM: 9.3 mg/dL (ref 8.4–10.5)
CHLORIDE: 101 meq/L (ref 96–112)
CO2: 29 mEq/L (ref 19–32)
CREATININE: 1.45 mg/dL — AB (ref 0.50–1.35)
GFR, Est African American: 57 mL/min — ABNORMAL LOW
GFR, Est Non African American: 49 mL/min — ABNORMAL LOW
GLUCOSE: 101 mg/dL — AB (ref 70–99)
Potassium: 4.6 mEq/L (ref 3.5–5.3)
SODIUM: 140 meq/L (ref 135–145)

## 2014-05-28 LAB — LIPID PANEL
CHOL/HDL RATIO: 4.5 ratio
CHOLESTEROL: 138 mg/dL (ref 0–200)
HDL: 31 mg/dL — AB (ref >=40)
LDL Cholesterol: 60 mg/dL (ref 0–99)
TRIGLYCERIDES: 237 mg/dL — AB (ref <150)
VLDL: 47 mg/dL — ABNORMAL HIGH (ref 0–40)

## 2014-05-28 LAB — HEPATIC FUNCTION PANEL
ALBUMIN: 3.9 g/dL (ref 3.5–5.2)
ALT: 19 U/L (ref 0–53)
AST: 20 U/L (ref 0–37)
Alkaline Phosphatase: 61 U/L (ref 39–117)
BILIRUBIN TOTAL: 0.4 mg/dL (ref 0.2–1.2)
Bilirubin, Direct: 0.1 mg/dL (ref 0.0–0.3)
Indirect Bilirubin: 0.3 mg/dL (ref 0.2–1.2)
TOTAL PROTEIN: 6.5 g/dL (ref 6.0–8.3)

## 2014-05-28 LAB — MAGNESIUM: Magnesium: 1.9 mg/dL (ref 1.5–2.5)

## 2014-05-28 LAB — HEMOGLOBIN A1C
Hgb A1c MFr Bld: 5.7 % — ABNORMAL HIGH (ref <5.7)
MEAN PLASMA GLUCOSE: 117 mg/dL — AB (ref <117)

## 2014-05-28 LAB — TSH: TSH: 1.017 u[IU]/mL (ref 0.350–4.500)

## 2014-05-28 NOTE — Progress Notes (Signed)
Patient ID: Jeffrey Carey, male   DOB: 18-Aug-1945, 69 y.o.   MRN: [id number]   This very nice 69 y.o.male presents for 3 month follow up with Hypertension, Hyperlipidemia, Pre-Diabetes and Vitamin D Deficiency.    Patient is treated for HTN & BP has been controlled at home. Today's BP: 114/76 mmHg. Patient has had no complaints of any cardiac type chest pain, palpitations, dyspnea/orthopnea/PND, dizziness, claudication, or dependent edema.   Hyperlipidemia is controlled with diet & meds. Patient denies myalgias or other med SE's. Last Lipids were 02/05/2014: Cholesterol, Total 121; HDL-C 36*; LDL (calc) 64; Triglycerides 106 on    Also, the patient has history of PreDiabetes and has had no symptoms of reactive hypoglycemia, diabetic polys, paresthesias or visual blurring.  Last A1c was 02/05/2014: Hemoglobin-A1c 5.6%.   Further, the patient also has history of Vitamin D Deficiency and supplements vitamin D without any suspected side-effects. Last vitamin D was 02/05/2014: VITD 66 on      Patient reports that he recently had neck surgery for a disc rupture.  He does report that since the surgery he has had no pain.  He has had a mild improvement in the tingling in his fingers.     Medication Sig  . aspirin 81 MG tablet Take 81 mg by mouth at bedtime.   Marland Kitchen atenolol (TENORMIN) 100 MG tablet TAKE 1 TABLET A DAY FOR BLOOD PRESSURE  . Cholecalciferol (HM VITAMIN D3) 2000 UNITS CAPS Take 2,000 Units by mouth at bedtime.   Marland Kitchen CINNAMON PO Take 1,000 mg by mouth daily.  . Magnesium 250 MG TABS Take 250 mg by mouth daily.  . Omega-3 Fatty Acids (FISH OIL PO) Take 1,200 mg by mouth at bedtime.   . pravastatin (PRAVACHOL) 40 MG tablet TAKE 1 TABLET (40 MG TOTAL) BY MOUTH DAILY.   Allergies  Allergen Reactions  . Ceftin [Cefuroxime Axetil] Hives  . Peanut-Containing Drug Products Other (See Comments)    Break out on nose    PMHx:   Past Medical History  Diagnosis Date  . Hyperlipidemia   . Hypertension    . ED (erectile dysfunction)   . Vitamin D deficiency   . Hypogonadism male   . Obesity   . History of kidney stones   . GERD (gastroesophageal reflux disease)     hx years ago   Immunization History  Administered Date(s) Administered  . Influenza Whole 10/10/2012  . Pneumococcal Conjugate-13 02/05/2014  . Pneumococcal Polysaccharide-23 07/10/2012   Past Surgical History  Procedure Laterality Date  . Laparoscopic cholecystectomy  10/2010  . Hammer toe surgery  2012    left foot, 3rd toe  . Anterior cervical decomp/discectomy fusion N/A 04/30/2014    Procedure: ANTERIOR CERVICAL DECOMPRESSION/DISCECTOMY FUSION 1 LEVEL;  Surgeon: Phylliss Bob, MD;  Location: [facility];  Service: Orthopedics;  Laterality: N/A;  Anterior cervical decompression fusion, cervical 7-thoracic 1 with instrumentation and allograft   FHx:    Reviewed / unchanged  SHx:    Reviewed / unchanged  Systems Review:  Review of Systems  Constitutional: Negative for fever, chills and malaise/fatigue.  HENT: Negative for congestion and sore throat.   Eyes: Negative.   Respiratory: Negative for cough, shortness of breath and wheezing.   Cardiovascular: Negative for chest pain, palpitations and leg swelling.  Gastrointestinal: Negative for heartburn, nausea, vomiting, abdominal pain, diarrhea and constipation.  Genitourinary: Negative for dysuria, urgency and frequency.  Musculoskeletal: Positive for neck pain. Negative for myalgias, back pain, joint pain and falls.  Skin: Negative.   Neurological: Positive for sensory change. Negative for dizziness, seizures, loss of consciousness and headaches.  Psychiatric/Behavioral: Negative for depression. The patient is not nervous/anxious and does not have insomnia.      Physical Exam  BP 114/76 mmHg  Pulse 60  Temp(Src) 97.8 F (36.6 C)  Resp 16  Ht 5\' 10"  (1.778 m)  Wt 215 lb 6.4 oz (97.705 kg)  BMI 30.91 kg/m2  Appears well nourished and in no distress. Eyes:  PERRLA, EOMs, conjunctiva no swelling or erythema. Sinuses: No frontal/maxillary tenderness ENT/Mouth: EAC's clear, TM's nl w/o erythema, bulging. Nares clear w/o erythema, swelling, exudates. Oropharynx clear without erythema or exudates. Oral hygiene is good. Tongue normal, non obstructing. Hearing intact.  Neck: Well healing neck incision on the left anterior neck.  Supple. Thyroid nl. Car 2+/2+ without bruits, nodes or JVD. Chest: Respirations nl with BS clear & equal w/o rales, rhonchi, wheezing or stridor.  Cor: Heart sounds normal w/ regular rate and rhythm without sig. murmurs, gallops, clicks, or rubs. Peripheral pulses normal and equal  without edema.  Abdomen: Soft & bowel sounds normal. Non-tender w/o guarding, rebound, hernias, masses, or organomegaly.  Lymphatics: Unremarkable.  Musculoskeletal: Full ROM all peripheral extremities, joint stability, 5/5 strength, and normal gait.  Skin: Warm, dry without exposed rashes, lesions or ecchymosis apparent.  Neuro: Cranial nerves intact, reflexes equal bilaterally. Sensory-motor testing grossly intact. Tendon reflexes grossly intact.  Pysch: Alert & oriented x 3.  Insight and judgement nl & appropriate. No ideations.  Assessment and Plan:   1. Essential hypertension -DASH diet -monitor at home -cont atenolol - TSH  2. Hyperlipidemia -cont pravachol - Lipid panel  3. Prediabetes  - Hemoglobin A1c - Insulin, random  4. Vitamin D deficiency -restart supplement - Vit D  25 hydroxy (rtn osteoporosis monitoring)  5. Morbid obesity (BMI 30.42) -increase daily walking  6. Gastroesophageal reflux disease, esophagitis presence not specified -zantac prn  7. Encounter for long-term (current) use of medications  - CBC with Differential/Platelet - BASIC METABOLIC PANEL WITH GFR - Hepatic function panel - Magnesium      Recommended regular exercise, BP monitoring, weight control, and discussed med and SE's. Recommended labs  to assess and monitor clinical status. Further disposition pending results of labs. Over 30 minutes of exam, counseling, chart review was performed

## 2014-05-28 NOTE — Patient Instructions (Signed)

## 2014-05-29 LAB — INSULIN, RANDOM: Insulin: 39.1 u[IU]/mL — ABNORMAL HIGH (ref 2.0–19.6)

## 2014-05-29 LAB — VITAMIN D 25 HYDROXY (VIT D DEFICIENCY, FRACTURES): Vit D, 25-Hydroxy: 49 ng/mL (ref 30–100)

## 2014-06-13 DIAGNOSIS — M5022 Other cervical disc displacement, mid-cervical region: Secondary | ICD-10-CM | POA: Diagnosis not present

## 2014-07-25 DIAGNOSIS — M5022 Other cervical disc displacement, mid-cervical region: Secondary | ICD-10-CM | POA: Diagnosis not present

## 2014-08-04 DIAGNOSIS — M542 Cervicalgia: Secondary | ICD-10-CM | POA: Diagnosis not present

## 2014-08-06 ENCOUNTER — Encounter: Payer: Self-pay | Admitting: Internal Medicine

## 2014-08-15 DIAGNOSIS — I10 Essential (primary) hypertension: Secondary | ICD-10-CM | POA: Diagnosis not present

## 2014-08-15 DIAGNOSIS — H5203 Hypermetropia, bilateral: Secondary | ICD-10-CM | POA: Diagnosis not present

## 2014-08-15 DIAGNOSIS — E780 Pure hypercholesterolemia: Secondary | ICD-10-CM | POA: Diagnosis not present

## 2014-08-15 DIAGNOSIS — H35033 Hypertensive retinopathy, bilateral: Secondary | ICD-10-CM | POA: Diagnosis not present

## 2014-09-11 ENCOUNTER — Encounter: Payer: Self-pay | Admitting: Internal Medicine

## 2014-09-11 ENCOUNTER — Ambulatory Visit (INDEPENDENT_AMBULATORY_CARE_PROVIDER_SITE_OTHER): Payer: Medicare Other | Admitting: Internal Medicine

## 2014-09-11 VITALS — BP 120/82 | HR 60 | Temp 97.5°F | Resp 18 | Ht 70.0 in | Wt 215.0 lb

## 2014-09-11 DIAGNOSIS — R7303 Prediabetes: Secondary | ICD-10-CM

## 2014-09-11 DIAGNOSIS — Z79899 Other long term (current) drug therapy: Secondary | ICD-10-CM

## 2014-09-11 DIAGNOSIS — Z1389 Encounter for screening for other disorder: Secondary | ICD-10-CM | POA: Diagnosis not present

## 2014-09-11 DIAGNOSIS — Z1331 Encounter for screening for depression: Secondary | ICD-10-CM

## 2014-09-11 DIAGNOSIS — I10 Essential (primary) hypertension: Secondary | ICD-10-CM | POA: Diagnosis not present

## 2014-09-11 DIAGNOSIS — Z125 Encounter for screening for malignant neoplasm of prostate: Secondary | ICD-10-CM

## 2014-09-11 DIAGNOSIS — Z789 Other specified health status: Secondary | ICD-10-CM

## 2014-09-11 DIAGNOSIS — Z9181 History of falling: Secondary | ICD-10-CM

## 2014-09-11 DIAGNOSIS — E559 Vitamin D deficiency, unspecified: Secondary | ICD-10-CM | POA: Diagnosis not present

## 2014-09-11 DIAGNOSIS — Z1212 Encounter for screening for malignant neoplasm of rectum: Secondary | ICD-10-CM

## 2014-09-11 DIAGNOSIS — Z0000 Encounter for general adult medical examination without abnormal findings: Secondary | ICD-10-CM | POA: Diagnosis not present

## 2014-09-11 DIAGNOSIS — R7309 Other abnormal glucose: Secondary | ICD-10-CM

## 2014-09-11 DIAGNOSIS — Z6830 Body mass index (BMI) 30.0-30.9, adult: Secondary | ICD-10-CM

## 2014-09-11 DIAGNOSIS — E782 Mixed hyperlipidemia: Secondary | ICD-10-CM | POA: Diagnosis not present

## 2014-09-11 DIAGNOSIS — K219 Gastro-esophageal reflux disease without esophagitis: Secondary | ICD-10-CM

## 2014-09-11 LAB — CBC WITH DIFFERENTIAL/PLATELET
BASOS ABS: 0.1 10*3/uL (ref 0.0–0.1)
Basophils Relative: 1 % (ref 0–1)
EOS PCT: 3 % (ref 0–5)
Eosinophils Absolute: 0.2 10*3/uL (ref 0.0–0.7)
HCT: 45.2 % (ref 39.0–52.0)
Hemoglobin: 15.3 g/dL (ref 13.0–17.0)
LYMPHS ABS: 1.5 10*3/uL (ref 0.7–4.0)
Lymphocytes Relative: 28 % (ref 12–46)
MCH: 30.1 pg (ref 26.0–34.0)
MCHC: 33.8 g/dL (ref 30.0–36.0)
MCV: 88.8 fL (ref 78.0–100.0)
MONOS PCT: 6 % (ref 3–12)
MPV: 8.9 fL (ref 8.6–12.4)
Monocytes Absolute: 0.3 10*3/uL (ref 0.1–1.0)
Neutro Abs: 3.4 10*3/uL (ref 1.7–7.7)
Neutrophils Relative %: 62 % (ref 43–77)
Platelets: 250 10*3/uL (ref 150–400)
RBC: 5.09 MIL/uL (ref 4.22–5.81)
RDW: 13.7 % (ref 11.5–15.5)
WBC: 5.5 10*3/uL (ref 4.0–10.5)

## 2014-09-11 LAB — LIPID PANEL
CHOLESTEROL: 128 mg/dL (ref 125–200)
HDL: 29 mg/dL — AB (ref >=40)
LDL Cholesterol: 59 mg/dL (ref <130)
TRIGLYCERIDES: 201 mg/dL — AB (ref <150)
Total CHOL/HDL Ratio: 4.4 Ratio (ref <=5.0)
VLDL: 40 mg/dL — ABNORMAL HIGH (ref <30)

## 2014-09-11 LAB — MAGNESIUM: Magnesium: 2 mg/dL (ref 1.5–2.5)

## 2014-09-11 LAB — TSH: TSH: 1.493 u[IU]/mL (ref 0.350–4.500)

## 2014-09-11 LAB — HEPATIC FUNCTION PANEL
ALBUMIN: 4.2 g/dL (ref 3.6–5.1)
ALK PHOS: 54 U/L (ref 40–115)
ALT: 21 U/L (ref 9–46)
AST: 23 U/L (ref 10–35)
Bilirubin, Direct: 0.1 mg/dL (ref <=0.2)
Indirect Bilirubin: 0.4 mg/dL (ref 0.2–1.2)
TOTAL PROTEIN: 6.5 g/dL (ref 6.1–8.1)
Total Bilirubin: 0.5 mg/dL (ref 0.2–1.2)

## 2014-09-11 LAB — BASIC METABOLIC PANEL WITH GFR
BUN: 23 mg/dL (ref 7–25)
CHLORIDE: 104 mmol/L (ref 98–110)
CO2: 27 mmol/L (ref 20–31)
Calcium: 9.3 mg/dL (ref 8.6–10.3)
Creat: 1.44 mg/dL — ABNORMAL HIGH (ref 0.70–1.25)
GFR, EST AFRICAN AMERICAN: 57 mL/min — AB (ref >=60)
GFR, Est Non African American: 50 mL/min — ABNORMAL LOW (ref >=60)
GLUCOSE: 96 mg/dL (ref 65–99)
Potassium: 4.8 mmol/L (ref 3.5–5.3)
Sodium: 141 mmol/L (ref 135–146)

## 2014-09-11 NOTE — Patient Instructions (Signed)

## 2014-09-11 NOTE — Progress Notes (Signed)
Patient ID: Jeffrey Carey, male   DOB: [birth year], 69 y.o.   MRN: [id number]   Comprehensive Examination  This very nice 69 y.o. MWM presents for complete physical.  Patient has been followed for HTN, Prediabetes, Hyperlipidemia, and Vitamin D Deficiency.   HTN predates since 2006. Patient's BP has been controlled at home.Today's BP: 120/82 mmHg. Patient denies any cardiac symptoms as chest pain, palpitations, shortness of breath, dizziness or ankle swelling.   Patient's hyperlipidemia is controlled with diet and medications. Patient denies myalgias or other medication SE's. Last lipids were at goal - Cholesterol 138; HDL 31*; LDL 60; with elevated Triglycerides 237 on 05/28/2014.   Patient has Morbid Obesity (BMI 30+) and consequent prediabetes since 2011 with A1c 5.7% and then later 5.4% in 2013.   Patient denies reactive hypoglycemic symptoms, visual blurring, diabetic polys or paresthesias. Last A1c was 5.7% on 05/28/2014.   Finally, patient has history of Vitamin D Deficiency of 24 in 2009 and last vitamin D was 49 on 05/28/2014.    Medication Sig  . aspirin 81 MG  Take 81 mg by mouth at bedtime.   Marland Kitchen atenolol  100 MG TAKE 1 TABLET A DAY FOR BLOOD PRESSURE  .  VITAMIN D 2000 UNITS Take 2,000 Units by mouth at bedtime.   Marland Kitchen CINNAMON  Take 1,000 mg by mouth daily.  . Magnesium 250 MG  Take 250 mg by mouth daily.  Marland Kitchen FISH OIL  Take 1,200 mg by mouth at bedtime.   . pravastatin  40 MG TAKE 1 TABLET (40 MG TOTAL) BY MOUTH DAILY.  . diazepam (VALIUM) 5 MG  Tid prn back pain   Allergies  Allergen Reactions  . Ceftin [Cefuroxime Axetil] Hives  . Peanut-Containing Drug Products Other (See Comments)    Break out on nose    Past Medical History  Diagnosis Date  . ED (erectile dysfunction)   . Vitamin D deficiency   . History of kidney stones    Health Maintenance  Topic Date Due  . Hepatitis C Screening  December 03, 1945  . ZOSTAVAX  08/04/2006  . INFLUENZA VACCINE  08/04/2014  . COLONOSCOPY   03/24/2016  . TETANUS/TDAP  06/14/2016  . PNA vac Low Risk Adult  Completed   Immunization History  Administered Date(s) Administered  . Influenza Whole 10/10/2012  . Pneumococcal Conjugate-13 02/05/2014  . Pneumococcal Polysaccharide-23 07/10/2012   Past Surgical History  Procedure Laterality Date  . Laparoscopic cholecystectomy  10/2010  . Hammer toe surgery  2012    left foot, 3rd toe  . Anterior cervical decomp/discectomy fusion N/A 04/30/2014    Procedure: ANTERIOR CERVICAL DECOMPRESSION/DISCECTOMY FUSION 1 LEVEL;  Surgeon: Phylliss Bob, MD;  Location: [place];  Service: Orthopedics;  Laterality: N/A;  Anterior cervical decompression fusion, cervical 7-thoracic 1 with instrumentation and allograft   Family History  Problem Relation Age of Onset  . Colon cancer Mother 54  . Cancer Mother     ovarian  . Colon cancer Father 87  . Diabetes Father   . Cancer Father     colon,prostate  . Alzheimer's disease Father   . Stomach cancer Neg Hx    Social History   Social History  . Marital Status: Married    Spouse Name: N/A  . Number of Children: N/A  . Years of Education: N/A   Occupational History  . Not on file.   Social History Main Topics  . Smoking status: Never Smoker   . Smokeless tobacco: Never Used  .  Alcohol Use: No  . Drug Use: No  . Sexual Activity: Not on file   Other Topics Concern  . Not on file   Social History Narrative    ROS Constitutional: Denies fever, chills, weight loss/gain, headaches, insomnia,  night sweats or change in appetite. Does c/o fatigue. Eyes: Denies redness, blurred vision, diplopia, discharge, itchy or watery eyes.  ENT: Denies discharge, congestion, post nasal drip, epistaxis, sore throat, earache, hearing loss, dental pain, Tinnitus, Vertigo, Sinus pain or snoring.  Cardio: Denies chest pain, palpitations, irregular heartbeat, syncope, dyspnea, diaphoresis, orthopnea, PND, claudication or edema Respiratory: denies cough,  dyspnea, DOE, pleurisy, hoarseness, laryngitis or wheezing.  Gastrointestinal: Denies dysphagia, heartburn, reflux, water brash, pain, cramps, nausea, vomiting, bloating, diarrhea, constipation, hematemesis, melena, hematochezia, jaundice or hemorrhoids Genitourinary: Denies dysuria, frequency, urgency, nocturia, hesitancy, discharge, hematuria or flank pain Musculoskeletal: Denies arthralgia, myalgia, stiffness, Jt. Swelling, pain, limp or strain/sprain. Denies Falls. Skin: Denies puritis, rash, hives, warts, acne, eczema or change in skin lesion Neuro: No weakness, tremor, incoordination, spasms, paresthesia or pain Psychiatric: Denies confusion, memory loss or sensory loss. Denies Depression. Endocrine: Denies change in weight, skin, hair change, nocturia, and paresthesia, diabetic polys, visual blurring or hyper / hypo glycemic episodes.  Heme/Lymph: No excessive bleeding, bruising or enlarged lymph nodes.  Physical Exam  BP 120/82   Pulse 60  Temp 97.5 F   Resp 18  Ht 5\' 10"  Wt 215 lb     BMI 30.85   General Appearance: Well nourished &  in no apparent distress. Eyes: PERRLA, EOMs, conjunctiva no swelling or erythema, normal fundi and vessels. Sinuses: No frontal/maxillary tenderness ENT/Mouth: EACs patent / TMs  nl. Nares clear without erythema, swelling, mucoid exudates. Oral hygiene is good. No erythema, swelling, or exudate. Tongue normal, non-obstructing. Tonsils not swollen or erythematous. Hearing normal.  Neck: Supple, thyroid normal. No bruits, nodes or JVD. Respiratory: Respiratory effort normal.  BS equal and clear bilateral without rales, rhonci, wheezing or stridor. Cardio: Heart sounds are normal with regular rate and rhythm and no murmurs, rubs or gallops. Peripheral pulses are normal and equal bilaterally without edema. No aortic or femoral bruits. Chest: symmetric with normal excursions and percussion.  Abdomen: Flat, soft, with bowel sounds. Nontender, no guarding,  rebound, hernias, masses, or organomegaly.  Lymphatics: Non tender without lymphadenopathy.  Genitourinary: No hernias.Testes nl. DRE - prostate nl for age - smooth & firm w/o nodules. Musculoskeletal: Full ROM all peripheral extremities, joint stability, 5/5 strength, and normal gait. Skin: Warm and dry without rashes, lesions, cyanosis, clubbing or  ecchymosis.  Neuro: Cranial nerves intact, reflexes equal bilaterally. Normal muscle tone, no cerebellar symptoms. Sensation intact.  Pysch: Alert and oriented X 3 with normal affect, insight and judgment appropriate.   Assessment and Plan  1. Essential hypertension  - Microalbumin / creatinine urine ratio - EKG 12-Lead - [place], RETROPERITNL ABD,  LTD - TSH  2. Hyperlipidemia  - Lipid panel  3. Prediabetes  - Hemoglobin A1c - Insulin, random  4. Vitamin D deficiency  - Vit D  25 hydroxy   5. Morbid obesity (BMI 30.42)   6. Gastroesophageal reflux disease   7. Screening for rectal cancer  - POC Hemoccult Bld  8. Prostate cancer screening  - PSA  9. BMI 30.0-30.9   10. At low risk for fall   11. Depression screen   12. Encounter for long-term (current) use of medications  - Urinalysis, Routine w reflex microscopic  - CBC with Differential/Platelet - BASIC  METABOLIC PANEL WITH GFR - Hepatic function panel - Magnesium   Continue prudent diet as discussed, weight control, BP monitoring, regular exercise, and medications as discussed.  Discussed med effects and SE's. Routine screening labs and tests as requested with regular follow-up as recommended.  Over 40 minutes of exam, counseling &  chart review was performed

## 2014-09-12 LAB — URINALYSIS, ROUTINE W REFLEX MICROSCOPIC
Bilirubin Urine: NEGATIVE
GLUCOSE, UA: NEGATIVE
KETONES UR: NEGATIVE
Leukocytes, UA: NEGATIVE
Nitrite: NEGATIVE
PH: 5 (ref 5.0–8.0)
Protein, ur: NEGATIVE
SPECIFIC GRAVITY, URINE: 1.011 (ref 1.001–1.035)

## 2014-09-12 LAB — URINALYSIS, MICROSCOPIC ONLY
Bacteria, UA: NONE SEEN [HPF]
CASTS: NONE SEEN [LPF]
Crystals: NONE SEEN [HPF]
RBC / HPF: NONE SEEN RBC/HPF (ref <=2)
Squamous Epithelial / LPF: NONE SEEN [HPF] (ref <=5)
WBC, UA: NONE SEEN WBC/HPF (ref <=5)
YEAST: NONE SEEN [HPF]

## 2014-09-12 LAB — MICROALBUMIN / CREATININE URINE RATIO
CREATININE, URINE: 73 mg/dL
MICROALB UR: 0.2 mg/dL (ref <2.0)
MICROALB/CREAT RATIO: 2.7 mg/g (ref 0.0–30.0)

## 2014-09-12 LAB — VITAMIN D 25 HYDROXY (VIT D DEFICIENCY, FRACTURES): VIT D 25 HYDROXY: 69 ng/mL (ref 30–100)

## 2014-09-12 LAB — HEMOGLOBIN A1C
Hgb A1c MFr Bld: 5.8 % — ABNORMAL HIGH (ref <5.7)
Mean Plasma Glucose: 120 mg/dL — ABNORMAL HIGH (ref <117)

## 2014-09-12 LAB — PSA: PSA: 1.51 ng/mL (ref <=4.00)

## 2014-09-12 LAB — INSULIN, RANDOM: INSULIN: 86.8 u[IU]/mL — AB (ref 2.0–19.6)

## 2014-09-15 DIAGNOSIS — M546 Pain in thoracic spine: Secondary | ICD-10-CM | POA: Diagnosis not present

## 2014-10-06 ENCOUNTER — Encounter: Payer: Self-pay | Admitting: Internal Medicine

## 2014-10-06 ENCOUNTER — Ambulatory Visit (INDEPENDENT_AMBULATORY_CARE_PROVIDER_SITE_OTHER): Payer: Medicare Other | Admitting: Physician Assistant

## 2014-10-06 VITALS — BP 134/86 | HR 60 | Temp 97.5°F | Resp 16 | Ht 70.0 in | Wt 213.0 lb

## 2014-10-06 DIAGNOSIS — J0100 Acute maxillary sinusitis, unspecified: Secondary | ICD-10-CM | POA: Diagnosis not present

## 2014-10-06 MED ORDER — AZITHROMYCIN 250 MG PO TABS: ORAL_TABLET | ORAL | Status: AC

## 2014-10-06 MED ORDER — PREDNISONE 20 MG PO TABS: ORAL_TABLET | ORAL | Status: DC

## 2014-10-06 NOTE — Patient Instructions (Signed)
Sinusitis can be uncomfortable. People with sinusitis have congestion with yellow/green/gray discharge, sinus pain/pressure, pain around the eyes. Sinus infections almost ALWAYS stem from a viral infection and antibiotics don't work against a virus. Even when bacteria is responsible, the infections usually clear up on their own in a week or so.   PLEASE TRY TO DO OVER THE COUNTER TREATMENT AND PREDNISONE FOR 5-7 DAYS AND IF YOU ARE NOT GETTING BETTER OR GETTING WORSE THEN YOU CAN START ON AN ANTIBIOTIC GIVEN.  Can take the prednisone AT NIGHT WITH DINNER, it take 8-12 hours to start working so it will NOT affect your sleeping if you take it at night with your food!! Take two pills the first night and 1 or two pill the second night and then 1 pill the other nights.   Risk of antibiotic use: About 1 in 4 people who take antibiotics have side effects including stomach problems, dizziness, or rashes. Those problems clear up soon after stopping the drugs, but in rare cases antibiotics can cause severe allergic reaction. Over use of antibiotics also encourages the growth of bacteria that can't be controlled easily with drugs. That makes you more vunerable to antibiotic-resistant infections and undermines the benefits of antibiotics for others.   Waste of Money: Antibiotics often aren't very expensive, but any money spent on unnecessary drugs is money down the drain.   When are antibiotics needed? Only when symptoms last longer than a week.  Start to improve but then worsen again  -It can take up to 2 weeks to feel better.   -If you do not get better in 7-10 days (Have fever, facial pain, dental pain and swelling), then please call the office and it is now appropriate to start an antibiotic.   -Please take Tylenol or Ibuprofen for pain. -Acetaminiphen 325mg orally every 4-6 hours for pain.  Max: 10 per day -Ibuprofen 200mg orally every 6-8 hours for pain.  Take with food to avoid ulcers.   Max 10 per  day  Please pick one of the over the counter allergy medications below and take it once daily for allergies.  Claritin or loratadine cheapest but likely the weakest  Zyrtec or certizine at night because it can make you sleepy The strongest is allegra or fexafinadine  Cheapest at [employer], [employer], [employer]  -While drinking fluids, pinch and hold nose close and swallow.  This will help open up your eustachian tubes to drain the fluid behind your ear drums. -Try steam showers to open your nasal passages.   Drink lots of water to stay hydrated and to thin mucous.  Flonase/Nasonex is to help the inflammation.  Take 2 sprays in each nostril at bedtime.  Make sure you spray towards the outside of each nostril towards the outer corner of your eye, hold nose close and tilt head back.  This will help the medication get into your sinuses.  If you do not like this medication, then use saline nasal sprays same directions as above for Flonase. Stop the medication right away if you get blurring of your vision or nose bleeds.  Sinusitis Sinusitis is redness, soreness, and inflammation of the paranasal sinuses. Paranasal sinuses are air pockets within the bones of your face (beneath the eyes, the middle of the forehead, or above the eyes). In healthy paranasal sinuses, mucus is able to drain out, and air is able to circulate through them by way of your nose. However, when your paranasal sinuses are inflamed, mucus and air can   become trapped. This can allow bacteria and other germs to grow and cause infection. Sinusitis can develop quickly and last only a short time (acute) or continue over a long period (chronic). Sinusitis that lasts for more than 12 weeks is considered chronic.  CAUSES  Causes of sinusitis include: Allergies. Structural abnormalities, such as displacement of the cartilage that separates your nostrils (deviated septum), which can decrease the air flow through your nose and sinuses and affect sinus  drainage. Functional abnormalities, such as when the small hairs (cilia) that line your sinuses and help remove mucus do not work properly or are not present. SIGNS AND SYMPTOMS  Symptoms of acute and chronic sinusitis are the same. The primary symptoms are pain and pressure around the affected sinuses. Other symptoms include: Upper toothache. Earache. Headache. Bad breath. Decreased sense of smell and taste. A cough, which worsens when you are lying flat. Fatigue. Fever. Thick drainage from your nose, which often is green and may contain pus (purulent). Swelling and warmth over the affected sinuses. DIAGNOSIS  Your health care provider will perform a physical exam. During the exam, your health care provider may: Look in your nose for signs of abnormal growths in your nostrils (nasal polyps).  Tap over the affected sinus to check for signs of infection. View the inside of your sinuses (endoscopy) using an imaging device that has a light attached (endoscope). If your health care provider suspects that you have chronic sinusitis, one or more of the following tests may be recommended: Allergy tests. Nasal culture. A sample of mucus is taken from your nose, sent to a lab, and screened for bacteria. Nasal cytology. A sample of mucus is taken from your nose and examined by your health care provider to determine if your sinusitis is related to an allergy. TREATMENT  Most cases of acute sinusitis are related to a viral infection and will resolve on their own within 10 days. Sometimes medicines are prescribed to help relieve symptoms (pain medicine, decongestants, nasal steroid sprays, or saline sprays).  However, for sinusitis related to a bacterial infection, your health care provider will prescribe antibiotic medicines. These are medicines that will help kill the bacteria causing the infection.  Rarely, sinusitis is caused by a fungal infection. In theses cases, your health care provider will  prescribe antifungal medicine. For some cases of chronic sinusitis, surgery is needed. Generally, these are cases in which sinusitis recurs more than 3 times per year, despite other treatments. HOME CARE INSTRUCTIONS  Drink plenty of water. Water helps thin the mucus so your sinuses can drain more easily. Use a humidifier. Inhale steam 3 to 4 times a day (for example, sit in the bathroom with the shower running). Apply a warm, moist washcloth to your face 3 to 4 times a day, or as directed by your health care provider. Use saline nasal sprays to help moisten and clean your sinuses. Take medicines only as directed by your health care provider. If you were prescribed either an antibiotic or antifungal medicine, finish it all even if you start to feel better. SEEK IMMEDIATE MEDICAL CARE IF: You have increasing pain or severe headaches. You have nausea, vomiting, or drowsiness. You have swelling around your face. You have vision problems. You have a stiff neck. You have difficulty breathing. MAKE SURE YOU:  Understand these instructions. Will watch your condition. Will get help right away if you are not doing well or get worse. Document Released: 12/20/2004 Document Revised: 05/06/2013 Document Reviewed: 01/04/2011 [employer]   Patient Information 2015 [employer]. This information is not intended to replace advice given to you by your health care provider. Make sure you discuss any questions you have with your health care provider.   

## 2014-10-06 NOTE — Progress Notes (Signed)
   Subjective:    Patient ID: Jeffrey Carey, male    DOB: [birth year], 69 y.o.   MRN: [id number]  HPI 69 y.o. WM with history of HTN, chol, preDM presents with sinus symptoms x 6 days. Having headache, sinus drainage, sore throat, cough without production. Wife was sick first. No fever, chills. Tylenol and nyquil not helping.   Blood pressure 134/86, pulse 60, temperature 97.5 F (36.4 C), resp. rate 16, height 5\' 10"  (1.778 m), weight 213 lb (96.616 kg).  Review of Systems  Constitutional: Negative for fever, chills and diaphoresis.  HENT: Positive for congestion, postnasal drip, rhinorrhea, sinus pressure and sore throat. Negative for ear pain, sneezing, trouble swallowing and voice change.   Eyes: Negative.   Respiratory: Positive for cough. Negative for chest tightness, shortness of breath and wheezing.   Cardiovascular: Negative.   Gastrointestinal: Negative.   Genitourinary: Negative.   Musculoskeletal: Negative.  Negative for neck pain.  Neurological: Negative.  Negative for headaches.       Objective:   Physical Exam  Constitutional: He is oriented to person, place, and time. He appears well-developed and well-nourished.  HENT:  Head: Normocephalic and atraumatic.  Right Ear: Hearing and tympanic membrane normal.  Left Ear: Hearing and tympanic membrane normal.  Nose: Right sinus exhibits maxillary sinus tenderness. Left sinus exhibits maxillary sinus tenderness.  Mouth/Throat: Uvula is midline and mucous membranes are normal. Posterior oropharyngeal erythema present. No oropharyngeal exudate, posterior oropharyngeal edema or tonsillar abscesses.  Eyes: Conjunctivae are normal. Pupils are equal, round, and reactive to light.  Neck: Normal range of motion. Neck supple.  Cardiovascular: Normal rate and regular rhythm.   Pulmonary/Chest: Effort normal and breath sounds normal.  Abdominal: Soft. Bowel sounds are normal.  Musculoskeletal: Normal range of motion.   Lymphadenopathy:    He has no cervical adenopathy.  Neurological: He is alert and oriented to person, place, and time.  Skin: Skin is warm and dry. No rash noted.      Assessment & Plan:  1. Acute maxillary sinusitis, recurrence not specified Will hold the zpak and take if she is not getting better, increase fluids, rest, cont allergy pill - azithromycin (ZITHROMAX) 250 MG tablet; Take 2 tablets (500 mg) on  Day 1,  followed by 1 tablet (250 mg) once daily on Days 2 through 5.  Dispense: 6 each; Refill: 1 - predniSONE (DELTASONE) 20 MG tablet; 2 tablets daily for 3 days, 1 tablet daily for 4 days.  Dispense: 10 tablet; Refill: 0

## 2014-11-11 ENCOUNTER — Other Ambulatory Visit: Payer: Self-pay | Admitting: Internal Medicine

## 2014-12-10 ENCOUNTER — Ambulatory Visit (INDEPENDENT_AMBULATORY_CARE_PROVIDER_SITE_OTHER): Payer: Medicare Other | Admitting: Internal Medicine

## 2014-12-10 ENCOUNTER — Encounter: Payer: Self-pay | Admitting: Internal Medicine

## 2014-12-10 VITALS — BP 128/84 | HR 62 | Temp 98.2°F | Resp 18 | Ht 70.0 in | Wt 212.0 lb

## 2014-12-10 DIAGNOSIS — R7303 Prediabetes: Secondary | ICD-10-CM | POA: Diagnosis not present

## 2014-12-10 DIAGNOSIS — R7309 Other abnormal glucose: Secondary | ICD-10-CM | POA: Diagnosis not present

## 2014-12-10 DIAGNOSIS — Z79899 Other long term (current) drug therapy: Secondary | ICD-10-CM

## 2014-12-10 DIAGNOSIS — I10 Essential (primary) hypertension: Secondary | ICD-10-CM

## 2014-12-10 DIAGNOSIS — E782 Mixed hyperlipidemia: Secondary | ICD-10-CM | POA: Diagnosis not present

## 2014-12-10 DIAGNOSIS — E559 Vitamin D deficiency, unspecified: Secondary | ICD-10-CM

## 2014-12-10 LAB — CBC WITH DIFFERENTIAL/PLATELET
BASOS ABS: 0 10*3/uL (ref 0.0–0.1)
Basophils Relative: 0 % (ref 0–1)
EOS PCT: 1 % (ref 0–5)
Eosinophils Absolute: 0.1 10*3/uL (ref 0.0–0.7)
HEMATOCRIT: 44.6 % (ref 39.0–52.0)
Hemoglobin: 15.7 g/dL (ref 13.0–17.0)
LYMPHS PCT: 13 % (ref 12–46)
Lymphs Abs: 1.3 10*3/uL (ref 0.7–4.0)
MCH: 30.8 pg (ref 26.0–34.0)
MCHC: 35.2 g/dL (ref 30.0–36.0)
MCV: 87.6 fL (ref 78.0–100.0)
MPV: 8.8 fL (ref 8.6–12.4)
Monocytes Absolute: 0.6 10*3/uL (ref 0.1–1.0)
Monocytes Relative: 6 % (ref 3–12)
NEUTROS ABS: 7.8 10*3/uL — AB (ref 1.7–7.7)
NEUTROS PCT: 80 % — AB (ref 43–77)
Platelets: 264 10*3/uL (ref 150–400)
RBC: 5.09 MIL/uL (ref 4.22–5.81)
RDW: 13.9 % (ref 11.5–15.5)
WBC: 9.8 10*3/uL (ref 4.0–10.5)

## 2014-12-10 LAB — HEPATIC FUNCTION PANEL
ALT: 22 U/L (ref 9–46)
AST: 25 U/L (ref 10–35)
Albumin: 4.7 g/dL (ref 3.6–5.1)
Alkaline Phosphatase: 64 U/L (ref 40–115)
BILIRUBIN DIRECT: 0.1 mg/dL (ref <=0.2)
Indirect Bilirubin: 0.4 mg/dL (ref 0.2–1.2)
TOTAL PROTEIN: 7.4 g/dL (ref 6.1–8.1)
Total Bilirubin: 0.5 mg/dL (ref 0.2–1.2)

## 2014-12-10 LAB — BASIC METABOLIC PANEL WITH GFR
BUN: 30 mg/dL — ABNORMAL HIGH (ref 7–25)
CALCIUM: 10.1 mg/dL (ref 8.6–10.3)
CO2: 24 mmol/L (ref 20–31)
CREATININE: 1.76 mg/dL — AB (ref 0.70–1.25)
Chloride: 104 mmol/L (ref 98–110)
GFR, EST AFRICAN AMERICAN: 45 mL/min — AB (ref >=60)
GFR, Est Non African American: 39 mL/min — ABNORMAL LOW (ref >=60)
GLUCOSE: 83 mg/dL (ref 65–99)
Potassium: 5.5 mmol/L — ABNORMAL HIGH (ref 3.5–5.3)
SODIUM: 140 mmol/L (ref 135–146)

## 2014-12-10 LAB — TSH: TSH: 1.683 u[IU]/mL (ref 0.350–4.500)

## 2014-12-10 MED ORDER — FLUTICASONE PROPIONATE 50 MCG/ACT NA SUSP: 2.0000 | Freq: Every day | NASAL | Status: DC

## 2014-12-10 NOTE — Patient Instructions (Signed)
Please take claritin, zyrtec, or allegra daily.  Store brand is okay.  Buy what is on sale.  Please use flonase or fluticasone spray 2 sprays per nostril right before you go to bed. Use this until congestion resolves completely.

## 2014-12-10 NOTE — Progress Notes (Signed)
Patient ID: Jeffrey Carey, male   DOB: 1945/09/14, 69 y.o.   MRN: [id number]  Assessment and Plan:  Hypertension:  -Continue medication,  -monitor blood pressure at home.  -Continue DASH diet.   -Reminder to go to the ER if any CP, SOB, nausea, dizziness, severe HA, changes vision/speech, left arm numbness and tingling, and jaw pain.  Cholesterol: -Continue diet and exercise.  -Check cholesterol.   Pre-diabetes: -Continue diet and exercise.  -Check A1C  Vitamin D Def: -check level -continue medications.   Otalgia -flonase -claritin/zyrtec/allegra  Continue diet and meds as discussed. Further disposition pending results of labs.  HPI 69 y.o. male  presents for 3 month follow up with hypertension, hyperlipidemia, prediabetes and vitamin D.   His blood pressure has been controlled at home, today their BP is BP: 128/84 mmHg.   He does workout. He denies chest pain, shortness of breath, dizziness.   He is on cholesterol medication and denies myalgias. His cholesterol is at goal. The cholesterol last visit was:   Lab Results  Component Value Date   CHOL 128 09/11/2014   HDL 29* 09/11/2014   LDLCALC 59 09/11/2014   TRIG 201* 09/11/2014   CHOLHDL 4.4 09/11/2014     He has been working on diet and exercise for prediabetes, and denies foot ulcerations, hyperglycemia, hypoglycemia , increased appetite, nausea, paresthesia of the feet, polydipsia, polyuria, visual disturbances, vomiting and weight loss. Last A1C in the office was:  Lab Results  Component Value Date   HGBA1C 5.8* 09/11/2014    Patient is on Vitamin D supplement.  Lab Results  Component Value Date   VD25OH 69 09/11/2014      Current Medications:  Current Outpatient Prescriptions on File Prior to Visit  Medication Sig Dispense Refill  . aspirin 81 MG tablet Take 81 mg by mouth at bedtime.     Marland Kitchen atenolol (TENORMIN) 100 MG tablet TAKE 1 TABLET A DAY FOR BLOOD PRESSURE 100 tablet 4  . Cholecalciferol (HM  VITAMIN D3) 2000 UNITS CAPS Take 2,000 Units by mouth at bedtime.     Marland Kitchen CINNAMON PO Take 1,000 mg by mouth daily.    . Magnesium 250 MG TABS Take 250 mg by mouth daily.    . Omega-3 Fatty Acids (FISH OIL PO) Take 1,200 mg by mouth at bedtime.     . pravastatin (PRAVACHOL) 40 MG tablet TAKE 1 TABLET (40 MG TOTAL) BY MOUTH DAILY. 90 tablet 2   No current facility-administered medications on file prior to visit.    Medical History:  Past Medical History  Diagnosis Date  . ED (erectile dysfunction)   . Vitamin D deficiency   . History of kidney stones     Allergies:  Allergies  Allergen Reactions  . Ceftin [Cefuroxime Axetil] Hives  . Peanut-Containing Drug Products Other (See Comments)    Break out on nose      Review of Systems:  Review of Systems  Constitutional: Negative for fever, chills and malaise/fatigue.  HENT: Positive for congestion. Negative for ear pain and sore throat.   Respiratory: Negative for cough, shortness of breath and wheezing.   Cardiovascular: Negative for chest pain, palpitations and leg swelling.  Gastrointestinal: Negative for diarrhea, constipation, blood in stool and melena.  Genitourinary: Negative for dysuria, urgency, frequency and hematuria.  Skin: Negative.   Neurological: Negative for dizziness, sensory change, loss of consciousness and headaches.  Psychiatric/Behavioral: Negative for depression. The patient is not nervous/anxious and does not have insomnia.  Family history- Review and unchanged  Social history- Review and unchanged  Physical Exam: BP 128/84 mmHg  Pulse 62  Temp(Src) 98.2 F (36.8 C) (Temporal)  Resp 18  Ht 5\' 10"  (1.778 m)  Wt 212 lb (96.163 kg)  BMI 30.42 kg/m2 Wt Readings from Last 3 Encounters:  12/10/14 212 lb (96.163 kg)  10/06/14 213 lb (96.616 kg)  09/11/14 215 lb (97.523 kg)    General Appearance: Well nourished well developed, in no apparent distress. Eyes: PERRLA, EOMs, conjunctiva no swelling or  erythema ENT/Mouth: Ear canals normal without obstruction, swelling, erythma, discharge.  TMs normal bilaterally.  Oropharynx moist, clear, without exudate, or postoropharyngeal swelling. Neck: Supple, thyroid normal,no cervical adenopathy  Respiratory: Respiratory effort normal, Breath sounds clear A&P without rhonchi, wheeze, or rale.  No retractions, no accessory usage. Cardio: RRR with no MRGs. Brisk peripheral pulses without edema.  Abdomen: Soft, + BS,  Non tender, no guarding, rebound, hernias, masses. Musculoskeletal: Full ROM, 5/5 strength, Normal gait Skin: Warm, dry without rashes, lesions, ecchymosis. Dark lesion on right mid shin. Round without changes.    Neuro: Awake and oriented X 3, Cranial nerves intact. Normal muscle tone, no cerebellar symptoms. Psych: Normal affect, Insight and Judgment appropriate.    Starlyn Skeans, PA-C 2:10 PM [facility]

## 2014-12-11 ENCOUNTER — Ambulatory Visit: Payer: Self-pay | Admitting: Internal Medicine

## 2014-12-11 LAB — HEMOGLOBIN A1C
HEMOGLOBIN A1C: 5.5 % (ref <5.7)
MEAN PLASMA GLUCOSE: 111 mg/dL (ref <117)

## 2015-01-01 ENCOUNTER — Other Ambulatory Visit: Payer: Self-pay | Admitting: Internal Medicine

## 2015-01-09 ENCOUNTER — Other Ambulatory Visit: Payer: Self-pay | Admitting: *Deleted

## 2015-01-09 MED ORDER — FLUTICASONE PROPIONATE 50 MCG/ACT NA SUSP: NASAL | Status: DC

## 2015-01-24 ENCOUNTER — Other Ambulatory Visit: Payer: Self-pay | Admitting: Internal Medicine

## 2015-03-12 ENCOUNTER — Encounter: Payer: Self-pay | Admitting: Internal Medicine

## 2015-03-12 ENCOUNTER — Ambulatory Visit (INDEPENDENT_AMBULATORY_CARE_PROVIDER_SITE_OTHER): Payer: Medicare Other | Admitting: Internal Medicine

## 2015-03-12 VITALS — BP 120/82 | HR 56 | Temp 97.5°F | Resp 16 | Ht 70.0 in | Wt 216.2 lb

## 2015-03-12 DIAGNOSIS — Z79899 Other long term (current) drug therapy: Secondary | ICD-10-CM | POA: Diagnosis not present

## 2015-03-12 DIAGNOSIS — K219 Gastro-esophageal reflux disease without esophagitis: Secondary | ICD-10-CM | POA: Diagnosis not present

## 2015-03-12 DIAGNOSIS — I10 Essential (primary) hypertension: Secondary | ICD-10-CM | POA: Diagnosis not present

## 2015-03-12 DIAGNOSIS — R7309 Other abnormal glucose: Secondary | ICD-10-CM | POA: Diagnosis not present

## 2015-03-12 DIAGNOSIS — R7303 Prediabetes: Secondary | ICD-10-CM

## 2015-03-12 DIAGNOSIS — E559 Vitamin D deficiency, unspecified: Secondary | ICD-10-CM

## 2015-03-12 DIAGNOSIS — E782 Mixed hyperlipidemia: Secondary | ICD-10-CM

## 2015-03-12 LAB — LIPID PANEL
Cholesterol: 105 mg/dL — ABNORMAL LOW (ref 125–200)
HDL: 31 mg/dL — AB (ref >=40)
LDL CALC: 60 mg/dL (ref <130)
Total CHOL/HDL Ratio: 3.4 Ratio (ref <=5.0)
Triglycerides: 70 mg/dL (ref <150)
VLDL: 14 mg/dL (ref <30)

## 2015-03-12 LAB — HEPATIC FUNCTION PANEL
ALT: 18 U/L (ref 9–46)
AST: 18 U/L (ref 10–35)
Albumin: 3.9 g/dL (ref 3.6–5.1)
Alkaline Phosphatase: 55 U/L (ref 40–115)
BILIRUBIN DIRECT: 0.1 mg/dL (ref <=0.2)
BILIRUBIN TOTAL: 0.5 mg/dL (ref 0.2–1.2)
Indirect Bilirubin: 0.4 mg/dL (ref 0.2–1.2)
Total Protein: 6.4 g/dL (ref 6.1–8.1)

## 2015-03-12 LAB — BASIC METABOLIC PANEL WITH GFR
BUN: 29 mg/dL — ABNORMAL HIGH (ref 7–25)
CALCIUM: 9.3 mg/dL (ref 8.6–10.3)
CO2: 29 mmol/L (ref 20–31)
CREATININE: 1.66 mg/dL — AB (ref 0.70–1.25)
Chloride: 104 mmol/L (ref 98–110)
GFR, Est African American: 48 mL/min — ABNORMAL LOW (ref >=60)
GFR, Est Non African American: 42 mL/min — ABNORMAL LOW (ref >=60)
Glucose, Bld: 93 mg/dL (ref 65–99)
Potassium: 5.5 mmol/L — ABNORMAL HIGH (ref 3.5–5.3)
SODIUM: 139 mmol/L (ref 135–146)

## 2015-03-12 LAB — CBC WITH DIFFERENTIAL/PLATELET
BASOS PCT: 1 % (ref 0–1)
Basophils Absolute: 0.1 10*3/uL (ref 0.0–0.1)
EOS ABS: 0.2 10*3/uL (ref 0.0–0.7)
EOS PCT: 4 % (ref 0–5)
HCT: 47.9 % (ref 39.0–52.0)
Hemoglobin: 16 g/dL (ref 13.0–17.0)
Lymphocytes Relative: 27 % (ref 12–46)
Lymphs Abs: 1.4 10*3/uL (ref 0.7–4.0)
MCH: 29.6 pg (ref 26.0–34.0)
MCHC: 33.4 g/dL (ref 30.0–36.0)
MCV: 88.5 fL (ref 78.0–100.0)
MONO ABS: 0.4 10*3/uL (ref 0.1–1.0)
MONOS PCT: 7 % (ref 3–12)
MPV: 9 fL (ref 8.6–12.4)
Neutro Abs: 3.1 10*3/uL (ref 1.7–7.7)
Neutrophils Relative %: 61 % (ref 43–77)
PLATELETS: 231 10*3/uL (ref 150–400)
RBC: 5.41 MIL/uL (ref 4.22–5.81)
RDW: 13.6 % (ref 11.5–15.5)
WBC: 5 10*3/uL (ref 4.0–10.5)

## 2015-03-12 LAB — MAGNESIUM: MAGNESIUM: 2.1 mg/dL (ref 1.5–2.5)

## 2015-03-12 LAB — TSH: TSH: 2.15 m[IU]/L (ref 0.40–4.50)

## 2015-03-12 NOTE — Patient Instructions (Signed)

## 2015-03-12 NOTE — Progress Notes (Signed)
Patient ID: Jeffrey Carey, male   DOB: [birth year], 70 y.o.   MRN: [id number]   This very nice 70 y.o. MWM  presents for 3 month follow up with Hypertension, Hyperlipidemia, Pre-Diabetes and Vitamin D Deficiency.    Patient is treated for HTN  Since circa 2006 & BP has been controlled at home. Today's BP: 120/82 mmHg. Patient has had no complaints of any cardiac type chest pain, palpitations, dyspnea/orthopnea/PND, dizziness, claudication, or dependent edema. Patient walks 2&1/2 miles/day for exercise.   Hyperlipidemia is controlled with diet & meds. Patient denies myalgias or other med SE's. Last Lipids were at goal with Cholesterol 105*; HDL 31*; LDL 60; Triglycerides 70 on 03/12/2015.   Also, the patient has history of PreDiabetes since 2011 with A1c 5.7% and with better diet improved to 5.4% by 2014. He denies symptoms of reactive hypoglycemia, diabetic polys, paresthesias or visual blurring.  Last A1c was still in remission at 5.5% on 12/10/2014.   Further, the patient also has history of Vitamin D Deficiency of "24" in 2009 and supplements vitamin D without any suspected side-effects. Last vitamin D was 69 on 09/11/2014.  Medication Sig  . aspirin 81 MG  Take  at bedtime.   . TENORMIN 100 MG  TAKE 1 TAB A DAY FOR BLOOD PRESSURE  . VITAMIN D3 2000 UNITS  Take 2,000 Units  at bedtime.   Marland Kitchen CINNAMON  Take 1,000 mg  daily.  Marland Kitchen FLONASE nasal spray PLACE 2 SPRAYS INTO BOTH NOSTRILS DAILY.  . Magnesium 250 MG  Take  daily.  . Omega-3 FISH OIL Take 1,200 mg  at bedtime.   . pravastatin  40 MG  TAKE 1 TAB DAILY.   Allergies  Allergen Reactions  . Ceftin [Cefuroxime Axetil] Hives  . Peanut-Containing Drug Products Other (See Comments)    Break out on nose    PMHx:   Past Medical History  Diagnosis Date  . ED (erectile dysfunction)   . Vitamin D deficiency   . History of kidney stones    Immunization History  Administered Date(s) Administered  . Influenza Whole 10/10/2012  .  Influenza-Unspecified 11/04/2014  . Pneumococcal Conjugate-13 02/05/2014  . Pneumococcal Polysaccharide-23 07/10/2012   Past Surgical History  Procedure Laterality Date  . Laparoscopic cholecystectomy  10/2010  . Hammer toe surgery  2012    left foot, 3rd toe  . Anterior cervical decomp/discectomy fusion N/A 04/30/2014    Procedure: ANTERIOR CERVICAL DECOMPRESSION/DISCECTOMY FUSION 1 LEVEL;  Surgeon: Phylliss Bob, MD;  Location: [place];  Service: Orthopedics;  Laterality: N/A;  Anterior cervical decompression fusion, cervical 7-thoracic 1 with instrumentation and allograft   FHx:    Reviewed / unchanged  SHx:    Reviewed / unchanged  Systems Review:  Constitutional: Denies fever, chills, wt changes, headaches, insomnia, fatigue, night sweats, change in appetite. Eyes: Denies redness, blurred vision, diplopia, discharge, itchy, watery eyes.  ENT: Denies discharge, congestion, post nasal drip, epistaxis, sore throat, earache, hearing loss, dental pain, tinnitus, vertigo, sinus pain, snoring.  CV: Denies chest pain, palpitations, irregular heartbeat, syncope, dyspnea, diaphoresis, orthopnea, PND, claudication or edema. Respiratory: denies cough, dyspnea, DOE, pleurisy, hoarseness, laryngitis, wheezing.  Gastrointestinal: Denies dysphagia, odynophagia, heartburn, reflux, water brash, abdominal pain or cramps, nausea, vomiting, bloating, diarrhea, constipation, hematemesis, melena, hematochezia  or hemorrhoids. Genitourinary: Denies dysuria, frequency, urgency, nocturia, hesitancy, discharge, hematuria or flank pain. Musculoskeletal: Denies arthralgias, myalgias, stiffness, jt. swelling, pain, limping or strain/sprain.  Skin: Denies pruritus, rash, hives, warts, acne, eczema or change in  skin lesion(s). Neuro: No weakness, tremor, incoordination, spasms, paresthesia or pain. Psychiatric: Denies confusion, memory loss or sensory loss. Endo: Denies change in weight, skin or hair change.   Heme/Lymph: No excessive bleeding, bruising or enlarged lymph nodes.  Physical Exam  BP 120/82 mmHg  Pulse 56  Temp(Src) 97.5 F (36.4 C)  Resp 16  Ht 5\' 10"  (1.778 m)  Wt 216 lb 3.2 oz (98.068 kg)  BMI 31.02 kg/m2  Appears well nourished and in no distress. Eyes: PERRLA, EOMs, conjunctiva no swelling or erythema. Sinuses: No frontal/maxillary tenderness ENT/Mouth: EAC's clear, TM's nl w/o erythema, bulging. Nares clear w/o erythema, swelling, exudates. Oropharynx clear without erythema or exudates. Oral hygiene is good. Tongue normal, non obstructing. Hearing intact.  Neck: Supple. Thyroid nl. Car 2+/2+ without bruits, nodes or JVD. Chest: Respirations nl with BS clear & equal w/o rales, rhonchi, wheezing or stridor.  Cor: Heart sounds normal w/ regular rate and rhythm without sig. murmurs, gallops, clicks, or rubs. Peripheral pulses normal and equal  without edema.  Abdomen: Soft & bowel sounds normal. Non-tender w/o guarding, rebound, hernias, masses, or organomegaly.  Lymphatics: Unremarkable.  Musculoskeletal: Full ROM all peripheral extremities, joint stability, 5/5 strength, and normal gait.  Skin: Warm, dry without exposed rashes, lesions or ecchymosis apparent.  Neuro: Cranial nerves intact, reflexes equal bilaterally. Sensory-motor testing grossly intact. Tendon reflexes grossly intact.  Pysch: Alert & oriented x 3.  Insight and judgement nl & appropriate. No ideations.  Assessment and Plan:  1. Essential hypertension  - TSH  2. Hyperlipidemia  - Lipid panel - TSH  3. Prediabetes  - Hemoglobin A1c - Insulin, random  4. Vitamin D deficiency  - VITAMIN D 25 Hydroxy   5. Morbid obesity, unspecified obesity type ([place])   6. Gastroesophageal reflux disease   7. Medication management  - CBC with Differential/Platelet - BASIC METABOLIC PANEL WITH GFR - Hepatic function panel - Magnesium     Recommended regular exercise, BP monitoring, weight control,  and discussed med and SE's. Recommended labs to assess and monitor clinical status. Further disposition pending results of labs. Over 30 minutes of exam, counseling, chart review was performed

## 2015-03-13 LAB — INSULIN, RANDOM: INSULIN: 13.2 u[IU]/mL (ref 2.0–19.6)

## 2015-03-13 LAB — HEMOGLOBIN A1C
Hgb A1c MFr Bld: 5.7 % — ABNORMAL HIGH (ref <5.7)
MEAN PLASMA GLUCOSE: 117 mg/dL — AB (ref <117)

## 2015-03-13 LAB — VITAMIN D 25 HYDROXY (VIT D DEFICIENCY, FRACTURES): VIT D 25 HYDROXY: 77 ng/mL (ref 30–100)

## 2015-03-31 ENCOUNTER — Encounter: Payer: Self-pay | Admitting: Internal Medicine

## 2015-03-31 ENCOUNTER — Ambulatory Visit (INDEPENDENT_AMBULATORY_CARE_PROVIDER_SITE_OTHER): Payer: Medicare Other | Admitting: Internal Medicine

## 2015-03-31 VITALS — BP 120/84 | HR 60 | Temp 97.5°F | Resp 16 | Ht 70.0 in | Wt 214.2 lb

## 2015-03-31 DIAGNOSIS — S60459A Superficial foreign body of unspecified finger, initial encounter: Secondary | ICD-10-CM

## 2015-03-31 DIAGNOSIS — I10 Essential (primary) hypertension: Secondary | ICD-10-CM
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
## 2015-04-07 ENCOUNTER — Encounter: Payer: Self-pay | Admitting: Internal Medicine

## 2015-04-07 ENCOUNTER — Ambulatory Visit: Payer: Medicare Other | Admitting: Internal Medicine

## 2015-04-07 VITALS — BP 114/80 | HR 56 | Temp 97.5°F | Resp 16 | Ht 70.0 in | Wt 214.0 lb

## 2015-04-07 DIAGNOSIS — S60459D Superficial foreign body of unspecified finger, subsequent encounter: Secondary | ICD-10-CM
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
## 2015-05-11 ENCOUNTER — Ambulatory Visit (INDEPENDENT_AMBULATORY_CARE_PROVIDER_SITE_OTHER): Payer: Medicare Other | Admitting: Physician Assistant

## 2015-05-11 ENCOUNTER — Encounter: Payer: Self-pay | Admitting: Physician Assistant

## 2015-05-11 VITALS — BP 132/64 | HR 60 | Temp 97.2°F | Resp 16 | Ht 70.0 in | Wt 215.0 lb

## 2015-05-11 DIAGNOSIS — E559 Vitamin D deficiency, unspecified: Secondary | ICD-10-CM | POA: Diagnosis not present

## 2015-05-11 DIAGNOSIS — R0602 Shortness of breath: Secondary | ICD-10-CM | POA: Diagnosis not present

## 2015-05-11 DIAGNOSIS — M5410 Radiculopathy, site unspecified: Secondary | ICD-10-CM

## 2015-05-11 DIAGNOSIS — Z79899 Other long term (current) drug therapy: Secondary | ICD-10-CM

## 2015-05-11 DIAGNOSIS — K219 Gastro-esophageal reflux disease without esophagitis: Secondary | ICD-10-CM

## 2015-05-11 DIAGNOSIS — Z86010 Personal history of colonic polyps: Secondary | ICD-10-CM | POA: Diagnosis not present

## 2015-05-11 DIAGNOSIS — Z0000 Encounter for general adult medical examination without abnormal findings: Secondary | ICD-10-CM

## 2015-05-11 DIAGNOSIS — Z136 Encounter for screening for cardiovascular disorders: Secondary | ICD-10-CM

## 2015-05-11 DIAGNOSIS — E782 Mixed hyperlipidemia: Secondary | ICD-10-CM | POA: Diagnosis not present

## 2015-05-11 DIAGNOSIS — Z0001 Encounter for general adult medical examination with abnormal findings: Secondary | ICD-10-CM | POA: Diagnosis not present

## 2015-05-11 DIAGNOSIS — R5383 Other fatigue: Secondary | ICD-10-CM

## 2015-05-11 DIAGNOSIS — R7303 Prediabetes: Secondary | ICD-10-CM

## 2015-05-11 DIAGNOSIS — R6889 Other general symptoms and signs: Secondary | ICD-10-CM | POA: Diagnosis not present

## 2015-05-11 DIAGNOSIS — I10 Essential (primary) hypertension: Secondary | ICD-10-CM

## 2015-05-11 LAB — BASIC METABOLIC PANEL WITH GFR
BUN: 21 mg/dL (ref 7–25)
CHLORIDE: 104 mmol/L (ref 98–110)
CO2: 27 mmol/L (ref 20–31)
Calcium: 9.5 mg/dL (ref 8.6–10.3)
Creat: 1.48 mg/dL — ABNORMAL HIGH (ref 0.70–1.25)
GFR, Est African American: 55 mL/min — ABNORMAL LOW (ref >=60)
GFR, Est Non African American: 48 mL/min — ABNORMAL LOW (ref >=60)
Glucose, Bld: 86 mg/dL (ref 65–99)
POTASSIUM: 5.1 mmol/L (ref 3.5–5.3)
SODIUM: 138 mmol/L (ref 135–146)

## 2015-05-11 LAB — CBC WITH DIFFERENTIAL/PLATELET
BASOS PCT: 0 %
Basophils Absolute: 0 cells/uL (ref 0–200)
EOS ABS: 210 {cells}/uL (ref 15–500)
EOS PCT: 3 %
HCT: 48.9 % (ref 38.5–50.0)
HEMOGLOBIN: 16.9 g/dL (ref 13.2–17.1)
LYMPHS ABS: 2170 {cells}/uL (ref 850–3900)
Lymphocytes Relative: 31 %
MCH: 30.3 pg (ref 27.0–33.0)
MCHC: 34.6 g/dL (ref 32.0–36.0)
MCV: 87.6 fL (ref 80.0–100.0)
MONOS PCT: 8 %
MPV: 9 fL (ref 7.5–12.5)
Monocytes Absolute: 560 cells/uL (ref 200–950)
NEUTROS ABS: 4060 {cells}/uL (ref 1500–7800)
Neutrophils Relative %: 58 %
PLATELETS: 248 10*3/uL (ref 140–400)
RBC: 5.58 MIL/uL (ref 4.20–5.80)
RDW: 14.3 % (ref 11.0–15.0)
WBC: 7 10*3/uL (ref 3.8–10.8)

## 2015-05-11 LAB — HEPATIC FUNCTION PANEL
ALK PHOS: 57 U/L (ref 40–115)
ALT: 19 U/L (ref 9–46)
AST: 20 U/L (ref 10–35)
Albumin: 4.2 g/dL (ref 3.6–5.1)
BILIRUBIN DIRECT: 0.1 mg/dL (ref <=0.2)
BILIRUBIN INDIRECT: 0.4 mg/dL (ref 0.2–1.2)
BILIRUBIN TOTAL: 0.5 mg/dL (ref 0.2–1.2)
Total Protein: 6.9 g/dL (ref 6.1–8.1)

## 2015-05-11 NOTE — Patient Instructions (Signed)
Here is some information to help you keep your heart healthy: Move it! - Aim for 30 mins of activity every day. Take it slowly at first. Talk to [place] before starting any new exercise program.   Lose it.  -Body Mass Index (BMI) can indicate if you need to lose weight. A healthy range is 18.5-24.9. For a BMI calculator, go to [employer]  Waist Management -Excess abdominal fat is a risk factor for heart disease, diabetes, asthma, stroke and more. Ideal waist circumference is less than 35" for women and less than 40" for men.   Eat Right -focus on fruits, vegetables, whole grains, and meals you make yourself. Avoid foods with trans fat and high sugar/sodium content.   Snooze or Snore? - Loud snoring can be a sign of sleep apnea, a significant risk factor for high blood pressure, heart attach, stroke, and heart arrhythmias.  Kick the habit -Quit Smoking! Avoid second hand smoke. A single cigarette raises your blood pressure for 20 mins and increases the risk of heart attack and stroke for the next 24 hours.   Are Aspirin and Supplements right for you? -Add ENTERIC COATED low dose 81 mg Aspirin daily OR can do every other day if you have easy bruising to protect your heart and head. As well as to reduce risk of Colon Cancer by 20 %, Skin Cancer by 26 % , Melanoma by 46% and Pancreatic cancer by 60%  Say "No to Stress -There may be little you can do about problems that cause stress. However, techniques such as long walks, meditation, and exercise can help you manage it.   Start Now! - Make changes one at a time and set reasonable goals to increase your likelihood of success.     Heart Attack A heart attack (myocardial infarction, MI) causes damage to your heart that cannot be fixed. A heart attack can happen when a heart (coronary) artery becomes blocked or narrowed. This cuts off the blood supply and oxygen to your heart. When one or more of your coronary arteries becomes blocked, that  area of your heart begins to die. This causes the pain you feel during a heart attack. Heart attack pain can also occur in one part of the body but be felt in another part of the body (referred pain). You may feel referred heart attack pain in your left arm, neck, or jaw. Pain may even be felt in the right arm. CAUSES  Many conditions can cause a heart attack. These include:   Atherosclerosis. This is when a fatty substance (plaque) gradually builds up in the arteries. This buildup can block or reduce the blood supply to one or more of the heart arteries.  A blood clot. A blood clot can develop suddenly when plaque breaks up (ruptures) within a heart artery. A blood clot can block the heart artery, which prevents blood flow to the heart.   Severe tightening (spasm) of the heart artery. This cuts off blood flow through the artery.  RISK FACTORS People at risk for heart attack usually have one or more of the following risk factors:   High blood pressure (hypertension).  High cholesterol.  Smoking.  Being male.  Being overweight or obese.  Older aged.   A family history of heart disease.  Lack of exercise.  Diabetes.  Stress.  Drinking too much alcohol.  Using illegal street drugs, such as cocaine and methamphetamines. SYMPTOMS  Heart attack symptoms can vary from person to person. Symptoms depend on  factors like gender and age.   In both men and women, heart attack symptoms can include the following:   Chest pain. This may feel like crushing, squeezing, or a feeling of pressure.  Shortness of breath.  Heartburn or indigestion with or without vomiting, shortness of breath, or sweating.  Sudden cold sweats.  Sudden light-headedness.  Upper back pain.   Women can have unique heart attack symptoms, such as:   Unexplained feelings of nervousness or anxiety.  Discomfort between the shoulder blades or upper back.  Tingling in the hands and arms.   Older  people (of both genders) can have subtle heart attack symptoms, such as:   Sweating.  Shortness of breath.  General tiredness or not feeling well.  DIAGNOSIS  Diagnosing a heart attack involves several tests. They include:   An assessment of your vital signs. This includes checking your:  Heart rhythm.  Blood pressure.  Breathing rate.  Oxygen level.   An ECG (electrocardiogram) to measure the electrical activity of your heart.  Blood tests called cardiac markers. In these tests, blood is drawn at scheduled times to check for the specific proteins or enzymes released by damaged heart muscle.  A chest X-ray.  An echocardiogram to evaluate heart motion and blood flow.  Coronary angiography to look at the heart arteries.  TREATMENT  Treatment for a heart attack may include:   Medicine that breaks up or dissolves blood clots in the heart artery.  Angioplasty.  Cardiac stent placement.  Intra-aortic balloon pump therapy (IABP).  Open heart surgery (coronary artery bypass graft, CABG). HOME CARE INSTRUCTIONS  Take medicines only as directed by your health care provider. You may need to take medicine after a heart attack to:   Keep your blood from clotting too easily.  Control your blood pressure.  Lower your cholesterol.  Control abnormal heart rhythms.   Do not take the following medicines unless your health care provider approves:  Nonsteroidal anti-inflammatory drugs (NSAIDs), such as ibuprofen, naproxen, or celecoxib.  Vitamin supplements that contain vitamin A, vitamin E, or both.  Hormone replacement therapy that contains estrogen with or without progestin.  Make lifestyle changes as directed by your health care provider. These may include:   Quitting smoking, if you smoke.  Getting regular exercise. Ask your health care provider to suggest some activities that are safe for you.  Eating a heart-healthy diet. A registered dietitian can help you  learn healthy eating options.  Maintaining a healthy weight.   Managing diabetes, if necessary.  Reducing stress.  Limiting how much alcohol you drink. SEEK IMMEDIATE MEDICAL CARE IF:   You have sudden, unexplained chest discomfort.  You have sudden, unexplained discomfort in your arms, back, neck, or jaw.  You have shortness of breath at any time.  You suddenly start to sweat or your skin gets clammy.  You feel nauseous or vomit.  You suddenly feel light-headed or dizzy.  Your heart begins to beat fast or feels like it is skipping beats. These symptoms may represent a serious problem that is an emergency. Do not wait to see if the symptoms will go away. Get medical help right away. Call your local emergency services (911 in the [place] Do not drive yourself to the hospital.   This information is not intended to replace advice given to you by your health care provider. Make sure you discuss any questions you have with your health care provider.   Document Released: 12/20/2004 Document Revised: 01/10/2014 Document Reviewed:  02/22/2013 Elsevier Interactive Patient Education [employer].

## 2015-05-11 NOTE — Progress Notes (Signed)
Patient ID: Jeffrey Carey, male   DOB: 11-10-1945, 70 y.o.   MRN: [id number] .  MEDICARE ANNUAL WELLNESS VISIT AND OV  Assessment:   1. Essential hypertension - continue medications, DASH diet, exercise and monitor at home. Call if greater than 130/80.  - DECREASE ATENOLOL 1/2 PILL, MONITOR BP AT HOME - CBC with Differential/Platelet - BASIC METABOLIC PANEL WITH GFR - Hepatic function panel  2. Prediabetes Discussed general issues about diabetes pathophysiology and management., Educational material distributed., Suggested low cholesterol diet., Encouraged aerobic exercise., Discussed foot care., Reminded to get yearly retinal exam.  3. Morbid obesity, unspecified obesity type ([place]) Obesity with co morbidities- long discussion about weight loss, diet, and exercise  4. Medication management  5. Vitamin D deficiency  6. Radiculopathy, unspecified spinal region  7. Gastroesophageal reflux disease, esophagitis presence not specified Continue PPI/H2 blocker, diet discussed  8. Hyperlipidemia -continue medications, check lipids, decrease fatty foods, increase activity.   9. History of colonic polyps  10. Medicare annual wellness visit, subsequent  11. Other fatigue - CK total and CKMB (cardiac)not at [facility] - Troponin I - EKG 12-Lead  12. Shortness of breath No symptoms at this time, + SOB, fatigue, with exertion with history of HTN, chol, obesity.  - Sinus brady on EKG, cut atenolol in half, will refer cardio, go to ER if any symptoms.  - CK total and CKMB (cardiac)not at [facility] - Troponin I - EKG 12-Lead   Plan:   During the course of the visit the patient was educated and counseled about appropriate screening and preventive services including:    Pneumococcal vaccine   Influenza vaccine  Td vaccine  Screening electrocardiogram  Bone densitometry screening  Colorectal cancer screening  Diabetes screening  Glaucoma screening  Nutrition counseling   Advanced  directives: requested   Conditions/risks identified: BMI: Discussed weight loss, diet, and increase physical activity.  Increase physical activity: AHA recommends 150 minutes of physical activity a week.  Medications reviewed PreDiabetes is at goal, ACE/ARB therapy: not indicated Urinary Incontinence is not an issue: discussed non pharmacology and pharmacology options.  Fall risk: low- discussed PT, home fall assessment, medications.    Subjective:  Jeffrey Carey is a 70 y.o. MWM who presents for Medicare Annual Wellness Visit and OV.   His blood pressure has been controlled at home, today their BP is BP: 132/64 mmHg  He does workout. He denies chest pain, shortness of breath, dizziness. However a few weeks ago he was mowing 2 yards, hot sweaty day, he states the last yard he was more fatigued than usual. He had extreme fatigue, sweating, nausea, ? Epigastric tightness, denies CP, SOB, jaw/neck/back pain.  Wife states that with exertion this past Friday and mowing he sounds more short of breath. He does admit to allergies, has eye burning/itching, nasal congestion recently, not on an allergy pill. Non smoker, has history of predm and hyperlipemia, on bASA.    He is on cholesterol medication and denies myalgias. His cholesterol is at goal. The cholesterol last visit was:   Lab Results  Component Value Date   CHOL 105* 03/12/2015   HDL 31* 03/12/2015   LDLCALC 60 03/12/2015   TRIG 70 03/12/2015   CHOLHDL 3.4 03/12/2015    He has been working on diet and exercise for prediabetes, and denies paresthesia of the feet, polydipsia, polyuria and visual disturbances. Last A1C in the office was:  Lab Results  Component Value Date   HGBA1C 5.7* 03/12/2015   Patient  is on Vitamin D supplement.   Lab Results  Component Value Date   VD25OH 77 03/12/2015     Names of Other Physician/Practitioners you currently use: 1. [facility] here for primary care 2.  Dr Sherlean Foot, eye doctor, last visit 2016 3. Dr Maris Berger, dentist, last visit jan 2017  Patient Care Team: [name], MD as PCP - General (Internal Medicine) Kathie Rhodes, MD as Consulting Physician (Urology) Jolyne Loa, [place] (Dentistry) [facility])  Medication Review:   Medication List       This list is accurate as of: 05/11/15 12:13 PM.  Always use your most recent med list.               aspirin 81 MG tablet  Take 81 mg by mouth at bedtime.     atenolol 100 MG tablet  Commonly known as:  TENORMIN  TAKE 1 TABLET A DAY FOR BLOOD PRESSURE     CINNAMON PO  Take 1,000 mg by mouth daily.     FISH OIL PO  Take 1,200 mg by mouth at bedtime.     FLAXSEED OIL PO  Take 1 capsule by mouth daily.     fluticasone 50 MCG/ACT nasal spray  Commonly known as:  FLONASE  PLACE 2 SPRAYS INTO BOTH NOSTRILS DAILY.     HM VITAMIN D3 2000 units Caps  Generic drug:  Cholecalciferol  Take 2,000 Units by mouth at bedtime.     Magnesium 250 MG Tabs  Take 250 mg by mouth daily.     pravastatin 40 MG tablet  Commonly known as:  PRAVACHOL  TAKE 1 TABLET (40 MG TOTAL) BY MOUTH DAILY.        Current Problems (verified) Patient Active Problem List   Diagnosis Date Noted  . Medicare annual wellness visit, subsequent 09/11/2014  . GERD 05/24/2014  . Morbid obesity (BMI 30.42) 05/24/2014  . Radiculopathy 04/30/2014  . Essential hypertension 01/10/2013  . Hyperlipidemia 01/10/2013  . Prediabetes 01/10/2013  . Vitamin D deficiency 01/10/2013  . Medication management 01/10/2013  . History of colonic polyps 08/28/2009   Screening Tests Immunization History  Administered Date(s) Administered  . Influenza Whole 10/10/2012  . Influenza-Unspecified 11/04/2014  . Pneumococcal Conjugate-13 02/05/2014  . Pneumococcal Polysaccharide-23 07/10/2012   Preventative care: Last colonoscopy: 03/2010 - 5 yr f/u - Dr Deatra Ina, due this year  Prior vaccinations: TD :  2008  Influenza: HD 09/2013 Pneumococcal: 07/2012 Prevnar/13: 02/05/2014 Shingles/Zostavax: undecided- deferred due to 95$ co-pay  History reviewed: allergies, current medications, past family history, past medical history, past social history, past surgical history and problem list  Allergies Allergies  Allergen Reactions  . Ceftin [Cefuroxime Axetil] Hives  . Peanut-Containing Drug Products Other (See Comments)    Break out on nose    Surgical history Past Surgical History  Procedure Laterality Date  . Laparoscopic cholecystectomy  10/2010  . Hammer toe surgery  2012    left foot, 3rd toe  . Anterior cervical decomp/discectomy fusion N/A 04/30/2014    Procedure: ANTERIOR CERVICAL DECOMPRESSION/DISCECTOMY FUSION 1 LEVEL;  Surgeon: Phylliss Bob, MD;  Location: [place];  Service: Orthopedics;  Laterality: N/A;  Anterior cervical decompression fusion, cervical 7-thoracic 1 with instrumentation and allograft   Family history Family History  Problem Relation Age of Onset  . Colon cancer Mother 51  . Cancer Mother     ovarian  . Colon cancer Father 76  . Diabetes Father   . Cancer Father  colon,prostate  . Alzheimer's disease Father   . Stomach cancer Neg Hx    Risk Factors: Tobacco Social History  Substance Use Topics  . Smoking status: Never Smoker   . Smokeless tobacco: Never Used  . Alcohol Use: No   He does not smoke.  Patient is not a former smoker. Are there smokers in your home (other than you)?  No  Alcohol Current alcohol use: none  Caffeine Current caffeine use: coffee 2 cups /day  Exercise Current exercise: walking  Nutrition/Diet Current diet: in general, a "healthy" diet    Cardiac risk factors: advanced age (older than 47 for men, 28 for women), dyslipidemia, hypertension, male gender and sedentary lifestyle.  Depression Screen (Note: if answer to either of the following is "Yes", a more complete depression screening is indicated)   Q1: Over the  past two weeks, have you felt down, depressed or hopeless? No  Q2: Over the past two weeks, have you felt little interest or pleasure in doing things? No  Have you lost interest or pleasure in daily life? No  Do you often feel hopeless? No  Do you cry easily over simple problems? No  Activities of Daily Living In your present state of health, do you have any difficulty performing the following activities?:  Driving? No Managing money?  No Feeding yourself? No Getting from bed to chair? No Climbing a flight of stairs? No Preparing food and eating?: No Bathing or showering? No Getting dressed: No Getting to the toilet? No Using the toilet:No Moving around from place to place: No In the past year have you fallen or had a near fall?:No  Vision Difficulties: No  Hearing Difficulties: No Do you often ask people to speak up or repeat themselves? No Do you experience ringing or noises in your ears? No Do you have difficulty understanding soft or whispered voices? No  Cognition  Do you feel that you have a problem with memory?No  Do you often misplace items? No  Do you feel safe at home?  Yes  Advanced directives Does patient have a [place]? Yes Does patient have a Living Will? Yes   Objective:     Blood pressure 132/64, pulse 60, temperature 97.2 F (36.2 C), temperature source Temporal, resp. rate 16, height 5\' 10"  (1.778 m), weight 215 lb (97.523 kg), SpO2 96 %.  Body mass index is 30.85 kg/(m^2). General appearance: alert, no distress, WD/WN, male   Cognitive Testing  Alert? Yes  Normal Appearance? Yes  Oriented to person? Yes  Place? Yes   Time? Yes  Recall of three objects?  Yes  Can perform simple calculations? Yes  Displays appropriate judgment? Yes  Can read the correct time from a watch/clock? Yes  HEENT: normocephalic, sclerae anicteric, TMs pearly, nares patent, no discharge or erythema, pharynx normal Oral cavity: MMM, no  lesions Neck: supple, no lymphadenopathy, no thyromegaly, no masses Heart: RRR, normal S1, S2, no murmurs Lungs: CTA bilaterally, no wheezes, rhonchi, or rales Abdomen: +bs, soft, non tender, non distended, no masses, no hepatomegaly, no splenomegaly Musculoskeletal: nontender, no swelling, no obvious deformity Extremities: no edema, no cyanosis, no clubbing Pulses: 2+ symmetric, upper and lower extremities, normal cap refill Neurological: alert, oriented x 3, CN2-12 intact, strength normal upper extremities and lower extremities, sensation normal throughout, DTRs 2+ throughout, no cerebellar signs, gait normal Psychiatric: normal affect, behavior normal, pleasant   Medicare Attestation I have personally reviewed: The patient's medical and social history Their use  of alcohol, tobacco or illicit drugs Their current medications and supplements The patient's functional ability including ADLs,fall risks, home safety risks, cognitive, and hearing and visual impairment Diet and physical activities Evidence for depression or mood disorders  The patient's weight, height, BMI, and visual acuity have been recorded in the chart.  I have made referrals, counseling, and provided education to the patient based on review of the above and I have provided the patient with a written personalized care plan for preventive services.    Vicie Mutters, PA-C   05/11/2015

## 2015-05-12 LAB — CK TOTAL AND CKMB (NOT AT ARMC)
CK, MB: 2.4 ng/mL (ref 0.0–5.0)
Relative Index: 2.3 (ref 0.0–4.0)
Total CK: 104 U/L (ref 7–232)

## 2015-05-12 LAB — TROPONIN I: Troponin I: <0.01 ng/mL (ref <=0.05)

## 2015-05-12 NOTE — Addendum Note (Signed)
Addended by: Vicie Mutters R on: 05/12/2015 09:12 AM   Modules accepted: Orders

## 2015-05-13 ENCOUNTER — Emergency Department (HOSPITAL_COMMUNITY): Payer: Medicare Other

## 2015-05-13 ENCOUNTER — Emergency Department (HOSPITAL_COMMUNITY)
Admission: EM | Admit: 2015-05-13 | Discharge: 2015-05-13 | Disposition: A | Discharge status: Home / Self Care | Payer: Medicare Other | Attending: Emergency Medicine | Admitting: Emergency Medicine

## 2015-05-13 ENCOUNTER — Encounter (HOSPITAL_COMMUNITY): Payer: Self-pay | Admitting: Emergency Medicine

## 2015-05-13 DIAGNOSIS — R0789 Other chest pain: Secondary | ICD-10-CM

## 2015-05-13 DIAGNOSIS — R109 Unspecified abdominal pain: Secondary | ICD-10-CM

## 2015-05-13 DIAGNOSIS — Z7982 Long term (current) use of aspirin: Secondary | ICD-10-CM | POA: Diagnosis not present

## 2015-05-13 DIAGNOSIS — R12 Heartburn: Secondary | ICD-10-CM | POA: Insufficient documentation

## 2015-05-13 DIAGNOSIS — Z87438 Personal history of other diseases of male genital organs: Secondary | ICD-10-CM | POA: Insufficient documentation

## 2015-05-13 DIAGNOSIS — Z87442 Personal history of urinary calculi: Secondary | ICD-10-CM | POA: Diagnosis not present

## 2015-05-13 DIAGNOSIS — Z7951 Long term (current) use of inhaled steroids: Secondary | ICD-10-CM | POA: Diagnosis not present

## 2015-05-13 DIAGNOSIS — Z79899 Other long term (current) drug therapy: Secondary | ICD-10-CM | POA: Insufficient documentation

## 2015-05-13 DIAGNOSIS — Z8639 Personal history of other endocrine, nutritional and metabolic disease: Secondary | ICD-10-CM | POA: Diagnosis not present

## 2015-05-13 DIAGNOSIS — R079 Chest pain, unspecified: Secondary | ICD-10-CM | POA: Diagnosis not present

## 2015-05-13 LAB — CBC
HEMATOCRIT: 46.6 % (ref 39.0–52.0)
HEMOGLOBIN: 15.4 g/dL (ref 13.0–17.0)
MCH: 29.2 pg (ref 26.0–34.0)
MCHC: 33 g/dL (ref 30.0–36.0)
MCV: 88.3 fL (ref 78.0–100.0)
Platelets: 237 10*3/uL (ref 150–400)
RBC: 5.28 MIL/uL (ref 4.22–5.81)
RDW: 13.7 % (ref 11.5–15.5)
WBC: 7.4 10*3/uL (ref 4.0–10.5)

## 2015-05-13 LAB — BASIC METABOLIC PANEL
Anion gap: 10 (ref 5–15)
BUN: 27 mg/dL — AB (ref 6–20)
CALCIUM: 9.4 mg/dL (ref 8.9–10.3)
CO2: 26 mmol/L (ref 22–32)
Chloride: 107 mmol/L (ref 101–111)
Creatinine, Ser: 1.87 mg/dL — ABNORMAL HIGH (ref 0.61–1.24)
GFR calc Af Amer: 41 mL/min — ABNORMAL LOW (ref >60)
GFR, EST NON AFRICAN AMERICAN: 35 mL/min — AB (ref >60)
GLUCOSE: 117 mg/dL — AB (ref 65–99)
POTASSIUM: 4.8 mmol/L (ref 3.5–5.1)
Sodium: 143 mmol/L (ref 135–145)

## 2015-05-13 LAB — I-STAT TROPONIN, ED: Troponin i, poc: 0.01 ng/mL (ref 0.00–0.08)

## 2015-05-13 MED ORDER — GI COCKTAIL ~~LOC~~
30.0000 mL | Freq: Once | ORAL | Status: AC
  Administered 2015-05-13: 30 mL via ORAL
  Filled 2015-05-13: qty 30

## 2015-05-13 MED ORDER — OMEPRAZOLE 20 MG PO CPDR: 20.0000 mg | DELAYED_RELEASE_CAPSULE | Freq: Every day | ORAL | Status: DC

## 2015-05-13 NOTE — ED Notes (Signed)
EDP at bedside  

## 2015-05-13 NOTE — ED Provider Notes (Addendum)
CSN: [id number]     Arrival date & time 05/13/15  0347 History   First MD Initiated Contact with Patient 05/13/15 0445     Chief Complaint  Patient presents with  . Chest Discomfort      (Consider location/radiation/quality/duration/timing/severity/associated sxs/prior Treatment) HPI Comments: 70yo M who p/w burning chest and abdominal discomfort. Pt reports several weeks of intermittent abdominal discomfort and burning sensation from stomach to throat. He notes that he sometimes feels the abdominal discomfort before eating and then feels relief of this discomfort after meals; he then later developed this burning sensation. Tonight, he ate a salad and later had this same burning sensation up to his throat and stated that he was tasting the salad in his mouth several times throughout the night. He saw his PCP earlier this week, who instructed him to discontinue NSAID use and seek ER attention if his symptoms worsens. He endorses have her use every evening for the past several months for neck pain. He also admits to eating a lot of greasy foods. He is not on any acid reducing medications. He denies any shortness of breath associated with his symptoms, although he states that last week he was mowing the lawn and his wife noticed afterwards that he seemed short of breath. This episode spontaneously resolved. He endorses mild abdominal discomfort currently but denies any chest pain or shortness of breath. No nausea, vomiting, diaphoresis, fevers, or recent illness.  The history is provided by the patient.    Past Medical History  Diagnosis Date  . ED (erectile dysfunction)   . Vitamin D deficiency   . History of kidney stones    Past Surgical History  Procedure Laterality Date  . Laparoscopic cholecystectomy  10/2010  . Hammer toe surgery  2012    left foot, 3rd toe  . Anterior cervical decomp/discectomy fusion N/A 04/30/2014    Procedure: ANTERIOR CERVICAL DECOMPRESSION/DISCECTOMY FUSION 1  LEVEL;  Surgeon: Phylliss Bob, MD;  Location: [place];  Service: Orthopedics;  Laterality: N/A;  Anterior cervical decompression fusion, cervical 7-thoracic 1 with instrumentation and allograft   Family History  Problem Relation Age of Onset  . Colon cancer Mother 63  . Cancer Mother     ovarian  . Colon cancer Father 41  . Diabetes Father   . Cancer Father     colon,prostate  . Alzheimer's disease Father   . Stomach cancer Neg Hx    Social History  Substance Use Topics  . Smoking status: Never Smoker   . Smokeless tobacco: Never Used  . Alcohol Use: No    Review of Systems 10 Systems reviewed and are negative for acute change except as noted in the HPI.    Allergies  Ceftin and Peanut-containing drug products  Home Medications   Prior to Admission medications   Medication Sig Start Date End Date Taking? Authorizing Provider  aspirin 81 MG tablet Take 81 mg by mouth at bedtime.     Historical Provider, MD  atenolol (TENORMIN) 100 MG tablet TAKE 1 TABLET A DAY FOR BLOOD PRESSURE 01/25/15   [name], MD  Cholecalciferol (HM VITAMIN D3) 2000 UNITS CAPS Take 2,000 Units by mouth at bedtime.     Historical Provider, MD  CINNAMON PO Take 1,000 mg by mouth daily.    Historical Provider, MD  Flaxseed, Linseed, (FLAXSEED OIL PO) Take 1 capsule by mouth daily.    Historical Provider, MD  fluticasone (FLONASE) 50 MCG/ACT nasal spray PLACE 2 SPRAYS INTO BOTH NOSTRILS DAILY.  01/09/15   [name], MD  Magnesium 250 MG TABS Take 250 mg by mouth daily.    Historical Provider, MD  Omega-3 Fatty Acids (FISH OIL PO) Take 1,200 mg by mouth at bedtime.     Historical Provider, MD  pravastatin (PRAVACHOL) 40 MG tablet TAKE 1 TABLET (40 MG TOTAL) BY MOUTH DAILY. 11/11/14   Vicie Mutters, PA-C   BP 146/95 mmHg  Pulse 57  Temp(Src) 98.4 F (36.9 C) (Oral)  Resp 28  SpO2 94% Physical Exam  Constitutional: He is oriented to person, place, and time. He appears well-developed and  well-nourished. No distress.  pleasant  HENT:  Head: Normocephalic and atraumatic.  Moist mucous membranes  Eyes: Conjunctivae are normal. Pupils are equal, round, and reactive to light.  Neck: Neck supple.  Cardiovascular: Normal rate, regular rhythm and normal heart sounds.   No murmur heard. Pulmonary/Chest: Effort normal and breath sounds normal.  Abdominal: Soft. Bowel sounds are normal. He exhibits no distension. There is no tenderness.  Musculoskeletal: He exhibits no edema.  Neurological: He is alert and oriented to person, place, and time.  Fluent speech  Skin: Skin is warm and dry.  Psychiatric: He has a normal mood and affect. Judgment normal.  Nursing note and vitals reviewed.   ED Course  Procedures (including critical care time) Labs Review Labs Reviewed  BASIC METABOLIC PANEL - Abnormal; Notable for the following:    Glucose, Bld 117 (*)    BUN 27 (*)    Creatinine, Ser 1.87 (*)    GFR calc non Af Amer 35 (*)    GFR calc Af Amer 41 (*)    All other components within normal limits  CBC  I-STAT TROPOININ, ED    Imaging Review Dg Chest 2 View  05/13/2015  CLINICAL DATA:  Chest pain for several weeks, worse tonight. EXAM: CHEST  2 VIEW COMPARISON:  08/22/2009 FINDINGS: Stable scarring in the left lateral base. The lungs are otherwise clear. The pulmonary vasculature is normal. There is no pleural effusion. Hilar, mediastinal and cardiac contours are unremarkable and unchanged. IMPRESSION: No active cardiopulmonary disease. Electronically Signed   By: Andreas Newport M.D.   On: 05/13/2015 05:01   I have personally reviewed and evaluated these lab results as part of my medical decision-making.   EKG Interpretation   Date/Time:  Wednesday May 13 2015 03:54:09 EDT Ventricular Rate:  59 PR Interval:  146 QRS Duration: 72 QT Interval:  414 QTC Calculation: 409 R Axis:   -16 Text Interpretation:  Sinus bradycardia Left ventricular hypertrophy  Possible Lateral  infarct , age undetermined Abnormal ECG No significant  change since last tracing Confirmed by Allan Minotti MD, Tj Kitchings [phone]) on  05/13/2015 5:00:13 AM     Medications  gi cocktail (Maalox,Lidocaine,Donnatal) (not administered)    MDM   Final diagnoses:  Abdominal discomfort  Burning in the chest   Pt p/w several weeks of intermittent chest discomfort which he later describes as abdominal discomfort with burning from his stomach to his throat, often occurring after meals. He was well appearing w/ reassuring VS at presentation. Normal physical exam. EKG unchanged from previous. Gave GI cocktail and obtained above labs. Chest x-ray negative acute. Labwork shows mild worsening of his kidney function at 1.87. I instructed the patient to hydrate and follow up with his PCP for reevaluation of creatinine. Troponin here is negative. He had a troponin drawn a few days ago he saw his PCP for these symptoms and it was also  normal. Given the association of his symptoms with food as well as his daily NSAID use which makes him at risk for gastric ulcers, I feel that ACS is unlikely explanation for symptoms. PT did voice improvement of sx after GI cocktail. Gave Rx for PPI and instructed to f/u w/ PCP regarding symptoms; he may require GI referral if no improvement. Return precautions reviewed. Pt voiced understanding and was discharged in satisfactory condition.    Sharlett Iles, MD 05/13/15 [phone]

## 2015-05-13 NOTE — ED Notes (Signed)
Pt in reporting chest discomfort present for past several weeks worse tonight. Noticed it started after eating a salad, burning sensation from stomach to throat. Denies hx of GERD.

## 2015-06-10 NOTE — Progress Notes (Signed)
Cardiology Office Note   Date:  06/11/2015   ID:  Jeffrey Carey, DOB April 25, 1945, MRN [id number]  PCP:  Alesia Richards, MD  Cardiologist:   Minus Breeding, MD   No chief complaint on file.     History of Present Illness: Jeffrey Carey is a 70 y.o. male who presents for evaluation of chest pain. He reports that he has had no prior cardiac history. He developed some burning discomfort in his stomach walking up to his epigastric area. It was a dull 5 out of 10 intensity discomfort. There was no radiation to his neck or to his arms. He had no associated symptoms such as nausea vomiting or shortness of breath. He's not had any palpitations, presyncope or syncope. In the emergency room (I reviewed the records) he was not found to have any ischemic EKG changes or enzyme elevations. It was thought to be GI and he was treated with a GI cocktail with resolution of his symptoms. He has since been on Prilosec without any recurrence of the symptoms. 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 With this level of activity even in hot weather he denies any cardiovascular symptoms.  The patient denies any new symptoms such as chest discomfort, neck or arm discomfort. There has been no new shortness of breath, PND or orthopnea. There have been no reported palpitations, presyncope or syncope.  Past Medical History  Diagnosis Date  . ED (erectile dysfunction)   . Vitamin D deficiency   . History of kidney stones   . Dyslipidemia     Past Surgical History  Procedure Laterality Date  . Laparoscopic cholecystectomy  10/2010  . Hammer toe surgery  2012    left foot, 3rd toe  . Anterior cervical decomp/discectomy fusion N/A 04/30/2014    Procedure: ANTERIOR CERVICAL DECOMPRESSION/DISCECTOMY FUSION 1 LEVEL;  Surgeon: Phylliss Bob, MD;  Location: [facility];  Service: Orthopedics;   Laterality: N/A;  Anterior cervical decompression fusion, cervical 7-thoracic 1 with instrumentation and allograft     Current Outpatient Prescriptions  Medication Sig Dispense Refill  . aspirin 81 MG tablet Take 81 mg by mouth at bedtime.     Marland Kitchen atenolol (TENORMIN) 100 MG tablet Take 1 tablet by mouth. Monday, Wednesday and friday  1  . Cholecalciferol (HM VITAMIN D3) 2000 UNITS CAPS Take 2,000 Units by mouth at bedtime.     Marland Kitchen CINNAMON PO Take 1,000 mg by mouth daily.    . Flaxseed, Linseed, (FLAXSEED OIL PO) Take 1 capsule by mouth daily.    . fluticasone (FLONASE) 50 MCG/ACT nasal spray PLACE 2 SPRAYS INTO BOTH NOSTRILS DAILY. 16 g 3  . Omega-3 Fatty Acids (FISH OIL PO) Take 1,200 mg by mouth at bedtime.     Marland Kitchen omeprazole (PRILOSEC) 20 MG capsule Take 1 capsule (20 mg total) by mouth daily. 30 capsule 0  . pravastatin (PRAVACHOL) 40 MG tablet Take 40 mg by mouth every Monday, Wednesday, and Friday.     No current facility-administered medications for this visit.    Allergies:   Ceftin and Peanut-containing drug products    Social History:  The patient  reports that he has never smoked. He has never used smokeless tobacco. He reports that he does not drink alcohol or use illicit drugs.   Family History:  The patient's family history includes Alzheimer's disease in his father; Cancer  in his father and mother; Colon cancer (age of onset: 32) in his father and mother; Diabetes in his father. There is no history of Stomach cancer.    ROS:  Please see the history of present illness.   Otherwise, review of systems are positive for none.   All other systems are reviewed and negative.    PHYSICAL EXAM: VS:  BP 143/86 mmHg  Pulse 53  Ht 5\' 11"  (1.803 m)  Wt 211 lb 12.8 oz (96.072 kg)  BMI 29.55 kg/m2 , BMI Body mass index is 29.55 kg/(m^2). GENERAL:  Well appearing HEENT:  Pupils equal round and reactive, fundi not visualized, oral mucosa unremarkable NECK:  No jugular venous distention,  waveform within normal limits, carotid upstroke brisk and symmetric, no bruits, no thyromegaly LYMPHATICS:  No cervical, inguinal adenopathy LUNGS:  Clear to auscultation bilaterally BACK:  No CVA tenderness CHEST:  Unremarkable HEART:  PMI not displaced or sustained,S1 and S2 within normal limits, no S3, no S4, no clicks, no rubs, no murmurs ABD:  Flat, positive bowel sounds normal in frequency in pitch, no bruits, no rebound, no guarding, no midline pulsatile mass, no hepatomegaly, no splenomegaly EXT:  2 plus pulses throughout, no edema, no cyanosis no clubbing SKIN:  No rashes no nodules NEURO:  Cranial nerves II through XII grossly intact, motor grossly intact throughout PSYCH:  Cognitively intact, oriented to person place and time    EKG:  EKG is not ordered today. The ekg ordered 5/10 demonstrates sinus rhythm, rate 59, axis within normal limits, intervals within normal limits, low voltage in the chest leads, poor anterior R wave progression, no acute ST-T wave changes.   Recent Labs: 03/12/2015: Magnesium 2.1; TSH 2.15 05/11/2015: ALT 19 05/13/2015: BUN 27*; Creatinine, Ser 1.87*; Hemoglobin 15.4; Platelets 237; Potassium 4.8; Sodium 143    Lipid Panel    Component Value Date/Time   CHOL 105* 03/12/2015 1221   TRIG 70 03/12/2015 1221   HDL 31* 03/12/2015 1221   CHOLHDL 3.4 03/12/2015 1221   VLDL 14 03/12/2015 1221   LDLCALC 60 03/12/2015 1221      Wt Readings from Last 3 Encounters:  06/11/15 211 lb 12.8 oz (96.072 kg)  05/11/15 215 lb (97.523 kg)  04/07/15 214 lb (97.07 kg)      Other studies Reviewed: Additional studies/ records that were reviewed today include: ED records. Review of the above records demonstrates:  Please see elsewhere in the note.     ASSESSMENT AND PLAN:  CHEST PAIN:  He has no further chest pain. His symptoms were atypical. Does not have significant cardiovascular risk factors (hypertension and dyslipidemia have been borderline). The pretest  probability of obstructive coronary disease is low. Therefore, further cardiovascular testing is not indicated.  HTN:  The blood pressure is at target. No change in medications is indicated. We will continue with therapeutic lifestyle changes (TLC).  RISK REDUCTION:  We had a long discussion about therapeutic lifestyle changes particularly to include exercise as he is not doing this.   Current medicines are reviewed at length with the patient today.  The patient does not have concerns regarding medicines.  The following changes have been made:  no change  Labs/ tests ordered today include:   None  No orders of the defined types were placed in this encounter.     Disposition:   FU with me as needed.     Signed, Minus Breeding, MD  06/11/2015 4:01 PM    [facility]

## 2015-06-11 ENCOUNTER — Ambulatory Visit (INDEPENDENT_AMBULATORY_CARE_PROVIDER_SITE_OTHER): Payer: Medicare Other | Admitting: Cardiology

## 2015-06-11 ENCOUNTER — Encounter: Payer: Self-pay | Admitting: Cardiology

## 2015-06-11 VITALS — BP 143/86 | HR 53 | Ht 71.0 in | Wt 211.8 lb

## 2015-06-11 DIAGNOSIS — R0789 Other chest pain: Secondary | ICD-10-CM | POA: Diagnosis not present

## 2015-06-11 NOTE — Patient Instructions (Signed)
Your physician recommends that you schedule a follow-up appointment in: As Needed    

## 2015-06-18 ENCOUNTER — Encounter: Payer: Self-pay | Admitting: Internal Medicine

## 2015-06-18 ENCOUNTER — Ambulatory Visit (INDEPENDENT_AMBULATORY_CARE_PROVIDER_SITE_OTHER): Payer: Medicare Other | Admitting: Internal Medicine

## 2015-06-18 VITALS — BP 122/74 | HR 60 | Temp 98.2°F | Resp 18 | Ht 70.0 in | Wt 212.0 lb

## 2015-06-18 DIAGNOSIS — Z79899 Other long term (current) drug therapy: Secondary | ICD-10-CM | POA: Diagnosis not present

## 2015-06-18 DIAGNOSIS — E559 Vitamin D deficiency, unspecified: Secondary | ICD-10-CM

## 2015-06-18 DIAGNOSIS — I10 Essential (primary) hypertension: Secondary | ICD-10-CM

## 2015-06-18 DIAGNOSIS — R7303 Prediabetes: Secondary | ICD-10-CM | POA: Diagnosis not present

## 2015-06-18 DIAGNOSIS — E782 Mixed hyperlipidemia: Secondary | ICD-10-CM

## 2015-06-18 MED ORDER — AZELASTINE HCL 0.1 % NA SOLN: 2.0000 | Freq: Two times a day (BID) | NASAL | Status: DC

## 2015-06-18 MED ORDER — SILDENAFIL CITRATE 20 MG PO TABS: ORAL_TABLET | ORAL | Status: DC

## 2015-06-18 NOTE — Patient Instructions (Signed)
Food Choices for Gastroesophageal Reflux Disease, Adult When you have gastroesophageal reflux disease (GERD), the foods you eat and your eating habits are very important. Choosing the right foods can help ease the discomfort of GERD. WHAT GENERAL GUIDELINES DO I NEED TO FOLLOW?  Choose fruits, vegetables, whole grains, low-fat dairy products, and low-fat meat, fish, and poultry.  Limit fats such as oils, salad dressings, butter, nuts, and avocado.  Keep a food diary to identify foods that cause symptoms.  Avoid foods that cause reflux. These may be different for different people.  Eat frequent small meals instead of three large meals each day.  Eat your meals slowly, in a relaxed setting.  Limit fried foods.  Cook foods using methods other than frying.  Avoid drinking alcohol.  Avoid drinking large amounts of liquids with your meals.  Avoid bending over or lying down until 2-3 hours after eating. WHAT FOODS ARE NOT RECOMMENDED? The following are some foods and drinks that may worsen your symptoms: Vegetables Tomatoes. Tomato juice. Tomato and spaghetti sauce. Chili peppers. Onion and garlic. Horseradish. Fruits Oranges, grapefruit, and lemon (fruit and juice). Meats High-fat meats, fish, and poultry. This includes hot dogs, ribs, ham, sausage, salami, and bacon. Dairy Whole milk and chocolate milk. Sour cream. Cream. Butter. Ice cream. Cream cheese.  Beverages Coffee and tea, with or without caffeine. Carbonated beverages or energy drinks. Condiments Hot sauce. Barbecue sauce.  Sweets/Desserts Chocolate and cocoa. Donuts. Peppermint and spearmint. Fats and Oils High-fat foods, including French fries and potato chips. Other Vinegar. Strong spices, such as black pepper, white pepper, red pepper, cayenne, curry powder, cloves, ginger, and chili powder. The items listed above may not be a complete list of foods and beverages to avoid. Contact your dietitian for more  information.   This information is not intended to replace advice given to you by your health care provider. Make sure you discuss any questions you have with your health care provider.   Document Released: 12/20/2004 Document Revised: 01/10/2014 Document Reviewed: 10/24/2012 Elsevier Interactive Patient Education 2016 Elsevier Inc.  

## 2015-06-18 NOTE — Progress Notes (Signed)
Assessment and Plan:  Hypertension:  -Continue medication,  -monitor blood pressure at home.  -Continue DASH diet.   -Reminder to go to the ER if any CP, SOB, nausea, dizziness, severe HA, changes vision/speech, left arm numbness and tingling, and jaw pain.  Cholesterol: -Continue diet and exercise.   Pre-diabetes: -Continue diet and exercise.   Vitamin D Def: -continue medications.   GERD -stop prilosec -start OTC zantac or pepcid -offered to write prescription which patient refused -discussed conservative measures to take  NO LABS AS THEY WERE DRAWN IN ER AND REVIEWED  Continue diet and meds as discussed. Further disposition pending results of labs.  HPI 70 y.o. male  presents for 3 month follow up with hypertension, hyperlipidemia, prediabetes and vitamin D.   His blood pressure has been controlled at home, today their BP is BP: 122/74 mmHg.   He does workout. He denies chest pain, shortness of breath, dizziness.  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     He is on cholesterol medication and denies myalgias. His cholesterol is at goal. The cholesterol last visit was:   Lab Results  Component Value Date   CHOL 105* 03/12/2015   HDL 31* 03/12/2015   LDLCALC 60 03/12/2015   TRIG 70 03/12/2015   CHOLHDL 3.4 03/12/2015     He has been working on diet and exercise for prediabetes, and denies foot ulcerations, hyperglycemia, hypoglycemia , increased appetite, nausea, paresthesia of the feet, polydipsia, polyuria, visual disturbances, vomiting and weight loss. Last A1C in the office was:  Lab Results  Component Value Date   HGBA1C 5.7* 03/12/2015    Patient is on Vitamin D supplement.  Lab Results  Component Value Date   VD25OH 21 03/12/2015     He reports that he did have to go to the ER for extreme chest pain and was diagnosed with GERD.  He was also set up with Dr. Percival Spanish for an outpatient visit  and Dr. Percival Spanish felt that this did not have anything to do with his heart.  He has had no further reflux while taking prilosec.  He reports that he wants to try something OTC for his reflux.    Current Medications:  Current Outpatient Prescriptions on File Prior to Visit  Medication Sig Dispense Refill  . aspirin 81 MG tablet Take 81 mg by mouth at bedtime.     Marland Kitchen atenolol (TENORMIN) 100 MG tablet Take 1 tablet by mouth. Monday, Wednesday and friday  1  . Cholecalciferol (HM VITAMIN D3) 2000 UNITS CAPS Take 2,000 Units by mouth at bedtime.     Marland Kitchen CINNAMON PO Take 1,000 mg by mouth daily.    . Flaxseed, Linseed, (FLAXSEED OIL PO) Take 1 capsule by mouth daily.    . fluticasone (FLONASE) 50 MCG/ACT nasal spray PLACE 2 SPRAYS INTO BOTH NOSTRILS DAILY. 16 g 3  . Omega-3 Fatty Acids (FISH OIL PO) Take 1,200 mg by mouth at bedtime.     Marland Kitchen omeprazole (PRILOSEC) 20 MG capsule Take 1 capsule (20 mg total) by mouth daily. 30 capsule 0  . pravastatin (PRAVACHOL) 40 MG tablet Take 40 mg by mouth every Monday, Wednesday, and Friday.     No current facility-administered medications on file prior to visit.    Medical History:  Past Medical History  Diagnosis Date  . ED (erectile dysfunction)   . Vitamin D deficiency   . History of kidney stones   .  Dyslipidemia     Allergies:  Allergies  Allergen Reactions  . Ceftin [Cefuroxime Axetil] Hives  . Peanut-Containing Drug Products Other (See Comments)    Break out on nose      Review of Systems:  Review of Systems  Constitutional: Negative for fever, chills and malaise/fatigue.  HENT: Negative for congestion, ear pain and sore throat.   Respiratory: Negative for cough, shortness of breath and wheezing.   Cardiovascular: Negative for chest pain, palpitations and leg swelling.  Gastrointestinal: Positive for heartburn. Negative for abdominal pain, diarrhea, constipation, blood in stool and melena.  Genitourinary: Negative.   Skin: Negative.    Neurological: Negative for dizziness, sensory change, loss of consciousness and headaches.  Psychiatric/Behavioral: Negative for depression. The patient is not nervous/anxious and does not have insomnia.     Family history- Review and unchanged  Social history- Review and unchanged  Physical Exam: BP 122/74 mmHg  Pulse 60  Temp(Src) 98.2 F (36.8 C) (Temporal)  Resp 18  Ht 5\' 10"  (1.778 m)  Wt 212 lb (96.163 kg)  BMI 30.42 kg/m2 Wt Readings from Last 3 Encounters:  06/18/15 212 lb (96.163 kg)  06/11/15 211 lb 12.8 oz (96.072 kg)  05/11/15 215 lb (97.523 kg)    General Appearance: Well nourished well developed, in no apparent distress. Eyes: PERRLA, EOMs, conjunctiva no swelling or erythema ENT/Mouth: Ear canals normal without obstruction, swelling, erythma, discharge.  TMs normal bilaterally.  Oropharynx moist, clear, without exudate, or postoropharyngeal swelling. Neck: Supple, thyroid normal,no cervical adenopathy  Respiratory: Respiratory effort normal, Breath sounds clear A&P without rhonchi, wheeze, or rale.  No retractions, no accessory usage. Cardio: RRR with no MRGs. Brisk peripheral pulses without edema.  Abdomen: Soft, + BS,  Non tender, no guarding, rebound, hernias, masses. Musculoskeletal: Full ROM, 5/5 strength, Normal gait Skin: Warm, dry without rashes, lesions, ecchymosis.  Neuro: Awake and oriented X 3, Cranial nerves intact. Normal muscle tone, no cerebellar symptoms. Psych: Normal affect, Insight and Judgment appropriate.    Starlyn Skeans, PA-C 11:30 AM [facility]

## 2015-07-18 ENCOUNTER — Other Ambulatory Visit: Payer: Self-pay | Admitting: Internal Medicine

## 2015-08-15 ENCOUNTER — Other Ambulatory Visit: Payer: Self-pay | Admitting: Physician Assistant

## 2015-09-11 DIAGNOSIS — H02831 Dermatochalasis of right upper eyelid: Secondary | ICD-10-CM | POA: Diagnosis not present

## 2015-09-11 DIAGNOSIS — H02834 Dermatochalasis of left upper eyelid: Secondary | ICD-10-CM | POA: Diagnosis not present

## 2015-09-11 DIAGNOSIS — H2513 Age-related nuclear cataract, bilateral: Secondary | ICD-10-CM | POA: Diagnosis not present

## 2015-10-07 ENCOUNTER — Ambulatory Visit (INDEPENDENT_AMBULATORY_CARE_PROVIDER_SITE_OTHER): Payer: Medicare Other | Admitting: Internal Medicine

## 2015-10-07 ENCOUNTER — Encounter: Payer: Self-pay | Admitting: Internal Medicine

## 2015-10-07 VITALS — BP 124/84 | HR 60 | Temp 97.3°F | Resp 16 | Ht 70.0 in | Wt 212.6 lb

## 2015-10-07 DIAGNOSIS — I10 Essential (primary) hypertension: Secondary | ICD-10-CM

## 2015-10-07 DIAGNOSIS — Z0001 Encounter for general adult medical examination with abnormal findings: Secondary | ICD-10-CM | POA: Diagnosis not present

## 2015-10-07 DIAGNOSIS — Z79899 Other long term (current) drug therapy: Secondary | ICD-10-CM

## 2015-10-07 DIAGNOSIS — Z136 Encounter for screening for cardiovascular disorders: Secondary | ICD-10-CM

## 2015-10-07 DIAGNOSIS — Z125 Encounter for screening for malignant neoplasm of prostate: Secondary | ICD-10-CM | POA: Diagnosis not present

## 2015-10-07 DIAGNOSIS — E782 Mixed hyperlipidemia: Secondary | ICD-10-CM

## 2015-10-07 DIAGNOSIS — E559 Vitamin D deficiency, unspecified: Secondary | ICD-10-CM | POA: Diagnosis not present

## 2015-10-07 DIAGNOSIS — R7303 Prediabetes: Secondary | ICD-10-CM

## 2015-10-07 DIAGNOSIS — Z0000 Encounter for general adult medical examination without abnormal findings: Secondary | ICD-10-CM

## 2015-10-07 DIAGNOSIS — Z1212 Encounter for screening for malignant neoplasm of rectum: Secondary | ICD-10-CM

## 2015-10-07 LAB — LIPID PANEL
CHOLESTEROL: 140 mg/dL (ref 125–200)
HDL: 33 mg/dL — ABNORMAL LOW (ref >=40)
LDL Cholesterol: 86 mg/dL (ref <130)
Total CHOL/HDL Ratio: 4.2 Ratio (ref <=5.0)
Triglycerides: 104 mg/dL (ref <150)
VLDL: 21 mg/dL (ref <30)

## 2015-10-07 LAB — CBC WITH DIFFERENTIAL/PLATELET
BASOS ABS: 0 {cells}/uL (ref 0–200)
Basophils Relative: 0 %
EOS ABS: 136 {cells}/uL (ref 15–500)
EOS PCT: 2 %
HCT: 47.2 % (ref 38.5–50.0)
Hemoglobin: 16 g/dL (ref 13.2–17.1)
Lymphocytes Relative: 26 %
Lymphs Abs: 1768 cells/uL (ref 850–3900)
MCH: 30.3 pg (ref 27.0–33.0)
MCHC: 33.9 g/dL (ref 32.0–36.0)
MCV: 89.4 fL (ref 80.0–100.0)
MONOS PCT: 8 %
MPV: 8.8 fL (ref 7.5–12.5)
Monocytes Absolute: 544 cells/uL (ref 200–950)
NEUTROS PCT: 64 %
Neutro Abs: 4352 cells/uL (ref 1500–7800)
PLATELETS: 231 10*3/uL (ref 140–400)
RBC: 5.28 MIL/uL (ref 4.20–5.80)
RDW: 13.9 % (ref 11.0–15.0)
WBC: 6.8 10*3/uL (ref 3.8–10.8)

## 2015-10-07 LAB — HEPATIC FUNCTION PANEL
ALBUMIN: 4.2 g/dL (ref 3.6–5.1)
ALT: 21 U/L (ref 9–46)
AST: 22 U/L (ref 10–35)
Alkaline Phosphatase: 57 U/L (ref 40–115)
BILIRUBIN DIRECT: 0.1 mg/dL (ref <=0.2)
BILIRUBIN TOTAL: 0.6 mg/dL (ref 0.2–1.2)
Indirect Bilirubin: 0.5 mg/dL (ref 0.2–1.2)
Total Protein: 6.8 g/dL (ref 6.1–8.1)

## 2015-10-07 LAB — BASIC METABOLIC PANEL WITH GFR
BUN: 23 mg/dL (ref 7–25)
CALCIUM: 9.3 mg/dL (ref 8.6–10.3)
CHLORIDE: 103 mmol/L (ref 98–110)
CO2: 27 mmol/L (ref 20–31)
CREATININE: 1.68 mg/dL — AB (ref 0.70–1.25)
GFR, Est African American: 47 mL/min — ABNORMAL LOW (ref >=60)
GFR, Est Non African American: 41 mL/min — ABNORMAL LOW (ref >=60)
Glucose, Bld: 95 mg/dL (ref 65–99)
Potassium: 4.9 mmol/L (ref 3.5–5.3)
SODIUM: 139 mmol/L (ref 135–146)

## 2015-10-07 LAB — MAGNESIUM: MAGNESIUM: 1.9 mg/dL (ref 1.5–2.5)

## 2015-10-07 LAB — TSH: TSH: 1.55 m[IU]/L (ref 0.40–4.50)

## 2015-10-07 NOTE — Progress Notes (Signed)
[facility]   [name], M.D.    [name]. [name], P.A.-C      Starlyn Skeans, P.A.-C  [facility]                [address] [place] [id number] Telephone [phone] Telefax [phone] Annual  Screening/Preventative Visit  & Comprehensive Evaluation & Examination     This very nice 70 y.o. MWM presents for a Screening/Preventative Visit & comprehensive evaluation and management of multiple medical co-morbidities.  Patient has been followed for HTN, Prediabetes, Hyperlipidemia and Vitamin D Deficiency.     HTN predates predating from 2006. Patient's BP has been controlled at home. Today's BP is 124/84. Patient denies any cardiac symptoms as chest pain, palpitations, shortness of breath, dizziness or ankle swelling.     Patient's hyperlipidemia is controlled with diet and medications. Patient denies myalgias or other medication SE's. Last lipids were at goal: Lab Results  Component Value Date   CHOL 105 (L) 03/12/2015   HDL 31 (L) 03/12/2015   LDLCALC 60 03/12/2015   TRIG 70 03/12/2015   CHOLHDL 3.4 03/12/2015      Patient has prediabetes circa 2011 with A1c 5.7% and then down to 5.4% in 2014.  He denies reactive hypoglycemic symptoms, visual blurring, diabetic polys or paresthesias. Last A1c was sl elevated again, but near goal:  Lab Results  Component Value Date   HGBA1C 5.7 (H) 03/12/2015       Finally, patient has history of Vitamin D Deficiency of "24" in 2008 and last vitamin D was at goal: Lab Results  Component Value Date   VD25OH 77 03/12/2015   Current Outpatient Prescriptions on File Prior to Visit  Medication Sig  . aspirin 81 MG tablet Take 81 mg by mouth at bedtime.   Marland Kitchen atenolol (TENORMIN) 100 MG tablet TAKE 1 TABLET A DAY FOR BLOOD PRESSURE  . azelastine (ASTELIN) 0.1 % nasal spray Place 2 sprays into both nostrils 2 (two) times daily. Use in each nostril as  directed  . Cholecalciferol (HM VITAMIN D3) 2000 UNITS CAPS Take 2,000 Units by mouth at bedtime.   Marland Kitchen CINNAMON PO Take 1,000 mg by mouth daily.  . Flaxseed, Linseed, (FLAXSEED OIL PO) Take 1 capsule by mouth daily.  . fluticasone (FLONASE) 50 MCG/ACT nasal spray PLACE 2 SPRAYS INTO BOTH NOSTRILS DAILY.  Marland Kitchen Omega-3 Fatty Acids (FISH OIL PO) Take 1,200 mg by mouth at bedtime.   Marland Kitchen omeprazole (PRILOSEC) 20 MG capsule Take 1 capsule (20 mg total) by mouth daily.  . pravastatin (PRAVACHOL) 40 MG tablet TAKE 1 TABLET (40 MG TOTAL) BY MOUTH DAILY.  . sildenafil (REVATIO) 20 MG tablet Take 2 tablets as needed   No current facility-administered medications on file prior to visit.     Allergies  Allergen Reactions  . Ceftin [Cefuroxime Axetil] Hives  . Peanut-Containing Drug Products Other (See Comments)    Break out on nose    Past Medical History:  Diagnosis Date  . Dyslipidemia   . ED (erectile dysfunction)   . History of kidney stones   . Vitamin D deficiency    Health Maintenance  Topic Date Due  . ZOSTAVAX  08/04/2006  . INFLUENZA VACCINE  08/04/2015  . Hepatitis C Screening  01/03/2018 (Originally 17-May-1945)  . COLONOSCOPY  03/24/2016  . TETANUS/TDAP  06/14/2016  . PNA vac Low  Risk Adult  Completed   Immunization History  Administered Date(s) Administered  . Influenza Whole 10/10/2012  . Influenza-Unspecified 11/04/2014  . Pneumococcal Conjugate-13 02/05/2014  . Pneumococcal Polysaccharide-23 07/10/2012  . Td 03/07/2006   Past Surgical History:  Procedure Laterality Date  . ANTERIOR CERVICAL DECOMP/DISCECTOMY FUSION N/A 04/30/2014   Procedure: ANTERIOR CERVICAL DECOMPRESSION/DISCECTOMY FUSION 1 LEVEL;  Surgeon: Phylliss Bob, MD;  Location: [facility];  Service: Orthopedics;  Laterality: N/A;  Anterior cervical decompression fusion, cervical 7-thoracic 1 with instrumentation and allograft  . HAMMER TOE SURGERY  2012   left foot, 3rd toe  . LAPAROSCOPIC CHOLECYSTECTOMY  10/2010    Family History  Problem Relation Age of Onset  . Colon cancer Mother 19  . Cancer Mother     ovarian  . Colon cancer Father 33  . Diabetes Father   . Cancer Father     colon,prostate  . Alzheimer's disease Father   . Stomach cancer Neg Hx    Social History   Social History  . Marital status: Married    Spouse name: N/A  . Number of children: 1  . Years of education: N/A   Social History Main Topics  . Smoking status: Never Smoker  . Smokeless tobacco: Never Used  . Alcohol use No  . Drug use: No   Social History Narrative   Lives with wife.  Retired Hydrologist for [employer] Constitutional: Denies fever, chills, weight loss/gain, headaches, insomnia,  night sweats or change in appetite. Does c/o fatigue. Eyes: Denies redness, blurred vision, diplopia, discharge, itchy or watery eyes.  ENT: Denies discharge, congestion, post nasal drip, epistaxis, sore throat, earache, hearing loss, dental pain, Tinnitus, Vertigo, Sinus pain or snoring.  Cardio: Denies chest pain, palpitations, irregular heartbeat, syncope, dyspnea, diaphoresis, orthopnea, PND, claudication or edema Respiratory: denies cough, dyspnea, DOE, pleurisy, hoarseness, laryngitis or wheezing.  Gastrointestinal: Denies dysphagia, heartburn, reflux, water brash, pain, cramps, nausea, vomiting, bloating, diarrhea, constipation, hematemesis, melena, hematochezia, jaundice or hemorrhoids Genitourinary: Denies dysuria, frequency, urgency, nocturia, hesitancy, discharge, hematuria or flank pain Musculoskeletal: Denies arthralgia, myalgia, stiffness, Jt. Swelling, pain, limp or strain/sprain. Denies Falls. Skin: Denies puritis, rash, hives, warts, acne, eczema or change in skin lesion Neuro: No weakness, tremor, incoordination, spasms, paresthesia or pain Psychiatric: Denies confusion, memory loss or sensory loss. Denies Depression. Endocrine: Denies change in weight, skin, hair change, nocturia, and  paresthesia, diabetic polys, visual blurring or hyper / hypo glycemic episodes.  Heme/Lymph: No excessive bleeding, bruising or enlarged lymph nodes.  Physical Exam  BP 124/84   Pulse 60   Temp 97.3 F (36.3 C)   Resp 16   Ht 5\' 10"  (1.778 m)   Wt 212 lb 9.6 oz (96.4 kg)   BMI 30.50 kg/m   General Appearance: Over nourished, in no apparent distress.  Eyes: PERRLA, EOMs, conjunctiva no swelling or erythema, normal fundi and vessels. Sinuses: No frontal/maxillary tenderness ENT/Mouth: EACs patent / TMs  nl. Nares clear without erythema, swelling, mucoid exudates. Oral hygiene is good. No erythema, swelling, or exudate. Tongue normal, non-obstructing. Tonsils not swollen or erythematous. Hearing normal.  Neck: Supple, thyroid normal. No bruits, nodes or JVD. Respiratory: Respiratory effort normal.  BS equal and clear bilateral without rales, rhonci, wheezing or stridor. Cardio: Heart sounds are normal with regular rate and rhythm and no murmurs, rubs or gallops. Peripheral pulses are normal and equal bilaterally without edema. No aortic or femoral bruits. Chest: symmetric with normal excursions and percussion.  Abdomen:  Soft, with Nl bowel sounds. Nontender, no guarding, rebound, hernias, masses, or organomegaly.  Lymphatics: Non tender without lymphadenopathy.  Genitourinary: No hernias. Testes nl. DRE - prostate nl for age - smooth & firm w/o nodules. Musculoskeletal: Full ROM all peripheral extremities, joint stability, 5/5 strength, and normal gait. Skin: Warm and dry without rashes, lesions, cyanosis, clubbing or  ecchymosis.  Neuro: Cranial nerves intact, reflexes equal bilaterally. Normal muscle tone, no cerebellar symptoms. Sensation intact.  Pysch: Alert and oriented X 3 with normal affect, insight and judgment appropriate.   Assessment and Plan  1. Annual Preventative/Screening Exam   - Microalbumin / creatinine urine ratio - EKG 12-Lead - [place], RETROPERITNL ABD,  LTD -  POC Hemoccult Bld/Stl  - Urinalysis, Routine w reflex microscopic  - PSA - CBC with Differential/Platelet - BASIC METABOLIC PANEL WITH GFR - Hepatic function panel - Magnesium - Lipid panel - TSH - Hemoglobin A1c - Insulin, random - VITAMIN D 25 Hydroxy   2. Essential hypertension  - EKG 12-Lead - [place], RETROPERITNL ABD,  LTD - TSH  3. Hyperlipidemia  - EKG 12-Lead - [place], RETROPERITNL ABD,  LTD - Lipid panel - TSH  4. Prediabetes  - Microalbumin / creatinine urine ratio - EKG 12-Lead - [place], RETROPERITNL ABD,  LTD - Hemoglobin A1c - Insulin, random  5. Vitamin D deficiency  - VITAMIN D 25 Hydroxy   6. Screening for rectal cancer  - POC Hemoccult Bld/Stl   7. Prostate cancer screening  - PSA  8. Screening for ischemic heart disease  - EKG 12-Lead  9. Screening for AAA (aortic abdominal aneurysm)  - [place], RETROPERITNL ABD,  LTD  10. Medication management  - Urinalysis, Routine w reflex microscopic  - CBC with Differential/Platelet - BASIC METABOLIC PANEL WITH GFR - Hepatic function panel - Magnesium      Continue prudent diet as discussed, weight control, BP monitoring, regular exercise, and medications as discussed.  Discussed med effects and SE's. Routine screening labs and tests as requested with regular follow-up as recommended. Over 40 minutes of exam, counseling, chart review and high complex critical decision making was performed

## 2015-10-07 NOTE — Patient Instructions (Signed)

## 2015-10-08 LAB — MICROALBUMIN / CREATININE URINE RATIO
CREATININE, URINE: 272 mg/dL (ref 20–370)
MICROALB UR: 1.3 mg/dL
MICROALB/CREAT RATIO: 5 ug/mg{creat} (ref <30)

## 2015-10-08 LAB — URINALYSIS, MICROSCOPIC ONLY
BACTERIA UA: NONE SEEN [HPF]
CRYSTALS: NONE SEEN [HPF]
Casts: NONE SEEN [LPF]
SQUAMOUS EPITHELIAL / LPF: NONE SEEN [HPF] (ref <=5)
WBC UA: NONE SEEN WBC/HPF (ref <=5)
Yeast: NONE SEEN [HPF]

## 2015-10-08 LAB — URINALYSIS, ROUTINE W REFLEX MICROSCOPIC
BILIRUBIN URINE: NEGATIVE
GLUCOSE, UA: NEGATIVE
KETONES UR: NEGATIVE
Leukocytes, UA: NEGATIVE
Nitrite: NEGATIVE
PROTEIN: NEGATIVE
Specific Gravity, Urine: 1.024 (ref 1.001–1.035)
pH: 5.5 (ref 5.0–8.0)

## 2015-10-08 LAB — HEMOGLOBIN A1C
HEMOGLOBIN A1C: 5.4 % (ref <5.7)
Mean Plasma Glucose: 108 mg/dL

## 2015-10-08 LAB — PSA: PSA: 2.3 ng/mL (ref <=4.0)

## 2015-10-08 LAB — VITAMIN D 25 HYDROXY (VIT D DEFICIENCY, FRACTURES): VIT D 25 HYDROXY: 96 ng/mL (ref 30–100)

## 2015-10-08 LAB — INSULIN, RANDOM: INSULIN: 16 u[IU]/mL (ref 2.0–19.6)

## 2015-11-13 ENCOUNTER — Other Ambulatory Visit: Payer: Self-pay | Admitting: *Deleted

## 2015-11-13 DIAGNOSIS — Z1212 Encounter for screening for malignant neoplasm of rectum: Secondary | ICD-10-CM

## 2015-11-13 DIAGNOSIS — Z0001 Encounter for general adult medical examination with abnormal findings: Secondary | ICD-10-CM

## 2015-11-13 LAB — POC HEMOCCULT BLD/STL (HOME/3-CARD/SCREEN)
FECAL OCCULT BLD: NEGATIVE
FECAL OCCULT BLD: NEGATIVE
FECAL OCCULT BLD: NEGATIVE

## 2016-01-15 ENCOUNTER — Encounter: Payer: Self-pay | Admitting: Gastroenterology

## 2016-01-18 ENCOUNTER — Encounter: Payer: Self-pay | Admitting: Internal Medicine

## 2016-01-18 ENCOUNTER — Ambulatory Visit (INDEPENDENT_AMBULATORY_CARE_PROVIDER_SITE_OTHER): Payer: Medicare Other | Admitting: Internal Medicine

## 2016-01-18 VITALS — BP 138/72 | HR 78 | Temp 97.7°F | Resp 16 | Ht 70.0 in | Wt 218.0 lb

## 2016-01-18 DIAGNOSIS — M7581 Other shoulder lesions, right shoulder: Secondary | ICD-10-CM | POA: Diagnosis not present

## 2016-01-18 MED ORDER — DEXAMETHASONE SODIUM PHOSPHATE 10 MG/ML IJ SOLN
10.0000 mg | Freq: Once | INTRAMUSCULAR | Status: AC
  Administered 2016-01-18: 10 mg via INTRAMUSCULAR

## 2016-01-18 MED ORDER — TIZANIDINE HCL 4 MG PO CAPS: 4.0000 mg | ORAL_CAPSULE | Freq: Three times a day (TID) | ORAL | 1 refills | Status: DC

## 2016-01-18 NOTE — Patient Instructions (Signed)
Shoulder Impingement Syndrome Shoulder impingement syndrome is a condition that causes pain when connective tissues (tendons) surrounding the shoulder joint become pinched. These tendons are part of the group of muscles and tissues that help to stabilize the shoulder (rotator cuff). Beneath the rotator cuff is a fluid-filled sac (bursa) that allows the muscles and tendons to glide smoothly. The bursa may become swollen or irritated (bursitis). Bursitis, swelling in the rotator cuff tendons, or both conditions can decrease how much space is under a bone in the shoulder joint (acromion), resulting in impingement. What are the causes? Shoulder impingement syndrome can be caused by bursitis or swelling of the rotator cuff tendons, which may result from:  Repetitive overhead arm movements.  Falling onto the shoulder.  Weakness in the shoulder muscles. What increases the risk? You may be more likely to develop this condition if you are an athlete who participates in:  Sports that involve throwing, such as baseball.  Tennis.  Swimming.  Volleyball. Some people are also more likely to develop impingement syndrome because of the shape of their acromion bone. What are the signs or symptoms? The main symptom of this condition is pain on the front or side of the shoulder. Pain may:  Get worse when lifting or raising the arm.  Get worse at night.  Wake you up from sleeping.  Feel sharp when the shoulder is moved, and then fade to an ache. Other signs and symptoms may include:  Tenderness.  Stiffness.  Inability to raise the arm above shoulder level or behind the body.  Weakness. How is this diagnosed? This condition may be diagnosed based on:  Your symptoms.  Your medical history.  A physical exam.  Imaging tests, such as:  X-rays.  MRI.  Ultrasound. How is this treated? Treatment for this condition may include:  Resting your shoulder and avoiding all activities that  cause pain or put stress on the shoulder.  Icing your shoulder.  NSAIDs to help reduce pain and swelling.  One or more injections of medicines to numb the area and reduce inflammation.  Physical therapy.  Surgery. This may be needed if nonsurgical treatments have not helped. Surgery may involve repairing the rotator cuff, reshaping the acromion, or removing the bursa. Follow these instructions at home: Managing pain, stiffness, and swelling   If directed, apply ice to the injured area.  Put ice in a plastic bag.  Place a towel between your skin and the bag.  Leave the ice on for 20 minutes, 2-3 times a day. Activity   Rest and return to your normal activities as told by your health care provider. Ask your health care provider what activities are safe for you.  Do exercises as told by your health care provider. General instructions   Do not use any tobacco products, including cigarettes, chewing tobacco, or e-cigarettes. Tobacco can delay healing. If you need help quitting, ask your health care provider.  Ask your health care provider when it is safe for you to drive.  Take over-the-counter and prescription medicines only as told by your health care provider.  Keep all follow-up visits as told by your health care provider. This is important. How is this prevented?  Give your body time to rest between periods of activity.  Be safe and responsible while being active to avoid falls.  Maintain physical fitness, including strength and flexibility. Contact a health care provider if:  Your symptoms have not improved after 1-2 months of treatment and rest.  You   cannot lift your arm away from your body. This information is not intended to replace advice given to you by your health care provider. Make sure you discuss any questions you have with your health care provider. Document Released: 12/20/2004 Document Revised: 08/27/2015 Document Reviewed: 11/22/2014 Elsevier  Interactive Patient Education  2017 [employer]  

## 2016-01-18 NOTE — Progress Notes (Signed)
Subjective:    Patient ID: Jeffrey Carey, male    DOB: December 11, 1945, 71 y.o.   MRN: [id number]  HPI  Patient presents to the office for 2 days of right shoulder and right sided thoracic pain.  He reports that the pain has been feeling like a trapped gas.  He reports that the pain is coming and going.  He reports that when he is sitting still he does sometimes get the pain.  He does have some pain when he twists to the left.  He reports that he has been coloring and sitting a lot more.  He has been taking ibuprofen occasionally.  He notes that he tries not to take much of it because it gives him acid reflux.  He has never had issues with pain on that side.  He was told that he may have an issue with his rotator cuff. He can still do overhead activities.  He reports that bowel habits are the same.  He notes that he has not had any trouble with urinating.  He has had kidney stones in the past.  He does not have a gallbladder.  His kidney stone episode was a "long time ago.".  He reports that this was over 10 years.  He notes that this is very different form his kidney stones.  He notes that generally his pain is a 3-4/10.    Review of Systems  Constitutional: Negative for chills, fatigue and fever.  Respiratory: Negative for chest tightness, shortness of breath and wheezing.   Cardiovascular: Negative for chest pain, palpitations and leg swelling.  Gastrointestinal: Negative for abdominal pain, anal bleeding, blood in stool, constipation, diarrhea, nausea and vomiting.  Genitourinary: Negative for dysuria, enuresis, frequency, hematuria and urgency.  Musculoskeletal: Positive for arthralgias and back pain. Negative for joint swelling.       Objective:   Physical Exam  Constitutional: He is oriented to person, place, and time. He appears well-developed and well-nourished. No distress.  HENT:  Head: Normocephalic.  Mouth/Throat: Oropharynx is clear and moist. No oropharyngeal exudate.  Eyes:  Conjunctivae are normal. No scleral icterus.  Neck: Normal range of motion. Neck supple. No JVD present. No thyromegaly present.  Cardiovascular: Normal rate, regular rhythm, normal heart sounds and intact distal pulses.  Exam reveals no gallop and no friction rub.   No murmur heard. Pulmonary/Chest: Effort normal and breath sounds normal. No respiratory distress. He has no wheezes. He has no rales. He exhibits no tenderness.  Abdominal: Soft. Bowel sounds are normal. He exhibits no distension and no mass. There is no tenderness. There is no rebound and no guarding.  Musculoskeletal:       Right shoulder: He exhibits normal range of motion, no tenderness, no bony tenderness, no swelling, no effusion, no crepitus, no deformity, no laceration, no pain, no spasm, normal pulse and normal strength.       Right hand: Normal sensation noted. Normal strength noted.  Right shoulder with positive speeds signs, positive hawkins kennedy, positive empty can testing, and   Lymphadenopathy:    He has no cervical adenopathy.  Neurological: He is alert and oriented to person, place, and time. No cranial nerve deficit. Coordination normal.  Skin: Skin is warm and dry. No rash noted. He is not diaphoretic.  Psychiatric: He has a normal mood and affect. His behavior is normal. Judgment and thought content normal.  Nursing note and vitals reviewed.   Vitals:   01/18/16 0926  BP: 138/72  Pulse:  78  Resp: 16  Temp: 97.7 F (36.5 C)         Assessment & Plan:    1. Right rotator cuff tendinitis -recommended that he color with some support for his shoulder and to avoid overhead movements.   -if no relief with steroid injection recommend referral to ortho for IA injection and possible PT.  - tiZANidine (ZANAFLEX) 4 MG capsule; Take 1 capsule (4 mg total) by mouth 3 (three) times daily.  Dispense: 90 capsule; Refill: 1 - dexamethasone (DECADRON) injection 10 mg; Inject 1 mL (10 mg total) into the muscle  once.

## 2016-02-09 ENCOUNTER — Encounter: Payer: Self-pay | Admitting: Gastroenterology

## 2016-02-16 ENCOUNTER — Ambulatory Visit (HOSPITAL_COMMUNITY)
Admission: RE | Admit: 2016-02-16 | Discharge: 2016-02-16 | Disposition: A | Discharge status: Home / Self Care | Payer: Medicare Other | Source: Ambulatory Visit | Attending: Internal Medicine | Admitting: Internal Medicine

## 2016-02-16 ENCOUNTER — Encounter: Payer: Self-pay | Admitting: Internal Medicine

## 2016-02-16 ENCOUNTER — Ambulatory Visit (INDEPENDENT_AMBULATORY_CARE_PROVIDER_SITE_OTHER): Payer: Medicare Other | Admitting: Internal Medicine

## 2016-02-16 VITALS — BP 120/72 | HR 60 | Temp 98.2°F | Resp 16 | Ht 70.0 in

## 2016-02-16 DIAGNOSIS — R0781 Pleurodynia: Secondary | ICD-10-CM | POA: Insufficient documentation

## 2016-02-16 DIAGNOSIS — R109 Unspecified abdominal pain: Secondary | ICD-10-CM

## 2016-02-16 MED ORDER — MELOXICAM 15 MG PO TABS: 15.0000 mg | ORAL_TABLET | Freq: Every day | ORAL | 2 refills | Status: DC

## 2016-02-16 NOTE — Patient Instructions (Signed)
Please take meloxicam daily with food.  Jeffrey Carey will be in touch with you about getting in to a physical therapist.  [facility]

## 2016-02-16 NOTE — Progress Notes (Signed)
Assessment and Plan:   1. Right flank pain -pain seems to be much more consistent with musculoskeletal pain than anything else.  NO CVA tenderness to palpation.  NO urinary or abdominal symptoms. Will get CXR to rule out any underlying lung conditions.  Try regular antiinflammatory and PT.  If no relief will send to ortho. - DG Chest 2 View; Future - meloxicam (MOBIC) 15 MG tablet; Take 1 tablet (15 mg total) by mouth daily.  Dispense: 30 tablet; Refill: 2 -Referral to physical therapy.       HPI 71 y.o.male presents for right sided rib cage pain.  He reports that he had this apoximately a month ago when he was seen for his right shoulder pain which has resolved but his chest and back pain has not.  He stopped coloring.  He was treated with decadron at his last visit.  He reports that sometimes it feels like it is waxing and waning.  He sleeps fine and it does not wake him from sleeping.  He is having normal bowel movement.  NO indigestion.  He is not having any coughing shortness of breath or wheezing.  He has never had any rib injuries in the past.  He has had his gallbladder removed. It does hurt with movement of his arm across the chest.    Past Medical History:  Diagnosis Date  . Dyslipidemia   . ED (erectile dysfunction)   . History of kidney stones   . Vitamin D deficiency      Allergies  Allergen Reactions  . Ceftin [Cefuroxime Axetil] Hives  . Peanut-Containing Drug Products Other (See Comments)    Break out on nose       Current Outpatient Prescriptions on File Prior to Visit  Medication Sig Dispense Refill  . aspirin 81 MG tablet Take 81 mg by mouth at bedtime.     Marland Kitchen atenolol (TENORMIN) 100 MG tablet TAKE 1 TABLET A DAY FOR BLOOD PRESSURE 90 tablet 1  . azelastine (ASTELIN) 0.1 % nasal spray Place 2 sprays into both nostrils 2 (two) times daily. Use in each nostril as directed 30 mL 2  . Cholecalciferol (HM VITAMIN D3) 2000 UNITS CAPS Take 2,000 Units by mouth at  bedtime.     Marland Kitchen CINNAMON PO Take 1,000 mg by mouth daily.    . Flaxseed, Linseed, (FLAXSEED OIL PO) Take 1 capsule by mouth daily.    . fluticasone (FLONASE) 50 MCG/ACT nasal spray PLACE 2 SPRAYS INTO BOTH NOSTRILS DAILY. 16 g 3  . Omega-3 Fatty Acids (FISH OIL PO) Take 1,200 mg by mouth at bedtime.     Marland Kitchen omeprazole (PRILOSEC) 20 MG capsule Take 1 capsule (20 mg total) by mouth daily. 30 capsule 0  . pravastatin (PRAVACHOL) 40 MG tablet TAKE 1 TABLET (40 MG TOTAL) BY MOUTH DAILY. 90 tablet 1  . sildenafil (REVATIO) 20 MG tablet Take 2 tablets as needed 30 tablet 1   No current facility-administered medications on file prior to visit.     ROS: all negative except above.   Physical Exam: There were no vitals filed for this visit. BP 120/72   Pulse 60   Temp 98.2 F (36.8 C) (Temporal)   Resp 16   Ht 5\' 10"  (1.778 m)  General Appearance: Well developed well nourished, non-toxic appearing in no apparent distress. Eyes: PERRLA, EOMs, conjunctiva w/ no swelling or erythema or discharge Sinuses: No Frontal/maxillary tenderness ENT/Mouth: Ear canals clear without swelling or erythema.  TM's normal bilaterally  with no retractions, bulging, or loss of landmarks.   Neck: Supple, thyroid normal, no notable JVD  Respiratory: Respiratory effort normal, Clear breath sounds anteriorly and posteriorly bilaterally without rales, rhonchi, wheezing or stridor. No retractions or accessory muscle usage. Cardio: RRR with no MRGs.   Abdomen: Soft, + BS.  Non tender, no guarding, rebound, hernias, masses. No CVA tenderness to palpation Musculoskeletal: Right scapula with mild tenderness to palpation.  Some right latismus dorsitenderness to palpation.  NO bony tenderness to palpation.  Pain with retraction of the shoulder blade.  Pain with lateral bending and rotation to the right.   Skin: Warm, dry without rashes  Neuro: Awake and oriented X 3, Cranial nerves intact. Normal muscle tone, no cerebellar symptoms.  Sensation intact.  Psych: normal affect, Insight and Judgment appropriate.     Starlyn Skeans, PA-C 2:36 PM [facility]

## 2016-04-15 ENCOUNTER — Encounter: Payer: Self-pay | Admitting: Gastroenterology

## 2016-04-15 ENCOUNTER — Ambulatory Visit (AMBULATORY_SURGERY_CENTER): Payer: Self-pay

## 2016-04-15 VITALS — Ht 71.0 in | Wt 217.2 lb

## 2016-04-15 DIAGNOSIS — Z860100 Personal history of colon polyps, unspecified: Secondary | ICD-10-CM

## 2016-04-15 MED ORDER — SUPREP BOWEL PREP KIT 17.5-3.13-1.6 GM/177ML PO SOLN: 1.0000 | Freq: Once | ORAL | 0 refills | Status: AC

## 2016-04-15 NOTE — Progress Notes (Signed)
No diet meds No home oxygen No past problems with anesthesia No allergies to eggs or soy  registerd emmi

## 2016-04-23 NOTE — Progress Notes (Signed)
Patient ID: Jeffrey Carey, male   DOB: 07/23/1945, 71 y.o.   MRN: [id number] .  MEDICARE ANNUAL WELLNESS VISIT AND OV  Assessment:    Essential hypertension - continue medications, DASH diet, exercise and monitor at home. Call if greater than 130/80.  -     atenolol (TENORMIN) 100 MG tablet; TAKE 1 TABLET A DAY FOR BLOOD PRESSURE -     CBC with Differential/Platelet -     BASIC METABOLIC PANEL WITH GFR -     TSH -     Hepatic function panel  Hyperlipidemia, mixed -continue medications, check lipids, decrease fatty foods, increase activity. -     pravastatin (PRAVACHOL) 40 MG tablet; TAKE 1 TABLET (40 MG TOTAL) BY MOUTH DAILY. -     Lipid panel  Prediabetes Discussed general issues about diabetes pathophysiology and management., Educational material distributed., Suggested low cholesterol diet., Encouraged aerobic exercise., Discussed foot care., Reminded to get yearly retinal exam. -     Hemoglobin A1c  Vitamin D deficiency -     VITAMIN D 25 Hydroxy (Vit-D Deficiency, Fractures)  Gastroesophageal reflux disease, esophagitis presence not specified Continue PPI/H2 blocker, diet discussed  Medication management -     Magnesium  Encounter for Medicare annual wellness exam Declines shingles at this time, may get TD at CPE  Radiculopathy, unspecified spinal region controlled  History of colonic polyps q 5 Years, getting this Friday  Class 1 obesity due to excess calories with serious comorbidity and body mass index (BMI) of 31.0 to 31.9 in adult - long discussion about weight loss, diet, and exercise - will start walking  Future Appointments Date Time Provider [place]  04/29/2016 9:30 AM Nelida Meuse III, MD [facility]  11/07/2016 10:00 AM [name], MD [facility] None     Plan:   During the course of the visit the patient was educated and counseled about appropriate screening and preventive services including:    Pneumococcal vaccine    Influenza vaccine  Td vaccine  Screening electrocardiogram  Bone densitometry screening  Colorectal cancer screening  Diabetes screening  Glaucoma screening  Nutrition counseling   Advanced directives: requested   Subjective:  Jeffrey Carey is a 71 y.o. MWM who presents for Medicare Annual Wellness Visit and OV.   His blood pressure has been controlled at home, today their BP is BP: 126/84  He does workout. He denies chest pain, shortness of breath, dizziness.   He is on cholesterol medication and denies myalgias. His cholesterol is at goal. The cholesterol last visit was:   Lab Results  Component Value Date   CHOL 140 10/07/2015   HDL 33 (L) 10/07/2015   LDLCALC 86 10/07/2015   TRIG 104 10/07/2015   CHOLHDL 4.2 10/07/2015    He has been working on diet and exercise for prediabetes, and denies paresthesia of the feet, polydipsia, polyuria and visual disturbances. Last A1C in the office was:  Lab Results  Component Value Date   HGBA1C 5.4 10/07/2015   Patient is on Vitamin D supplement.   Lab Results  Component Value Date   VD25OH 96 10/07/2015    BMI is Body mass index is 31.14 kg/m., he is working on diet and exercise. Wt Readings from Last 3 Encounters:  04/25/16 217 lb (98.4 kg)  04/15/16 217 lb 3.2 oz (98.5 kg)  01/18/16 218 lb (98.9 kg)     Names of Other Physician/Practitioners you currently use: 1. [facility] here for  primary care 2. Dr Sherlean Foot, eye doctor, last visit 2016 3. Dr Maris Berger, dentist, last visit jan 2017  Patient Care Team: [name], MD as PCP - General (Internal Medicine) Kathie Rhodes, MD as Consulting Physician (Urology) Jolyne Loa, DDS (Dentistry) [facility])  Medication Review: Current Outpatient Prescriptions on File Prior to Visit  Medication Sig  . aspirin 81 MG tablet Take 81 mg by mouth at bedtime.   Marland Kitchen azelastine (ASTELIN) 0.1 % nasal spray Place 2 sprays  into both nostrils 2 (two) times daily. Use in each nostril as directed  . Cholecalciferol (HM VITAMIN D3) 2000 UNITS CAPS Take 2,000 Units by mouth at bedtime.   Marland Kitchen CINNAMON PO Take 1,000 mg by mouth daily.  . Flaxseed, Linseed, (FLAXSEED OIL PO) Take 1 capsule by mouth daily.  . Magnesium 250 MG TABS Take by mouth.  . Omega-3 Fatty Acids (FISH OIL PO) Take 1,200 mg by mouth at bedtime.   Marland Kitchen omeprazole (PRILOSEC) 20 MG capsule Take 1 capsule (20 mg total) by mouth daily.  . sildenafil (REVATIO) 20 MG tablet Take 2 tablets as needed   No current facility-administered medications on file prior to visit.     Current Problems (verified) Patient Active Problem List   Diagnosis Date Noted  . GERD 05/24/2014  . Morbid obesity (BMI 30.42) 05/24/2014  . Radiculopathy 04/30/2014  . Essential hypertension 01/10/2013  . Hyperlipidemia 01/10/2013  . Prediabetes 01/10/2013  . Vitamin D deficiency 01/10/2013  . Medication management 01/10/2013  . History of colonic polyps 08/28/2009   Screening Tests Immunization History  Administered Date(s) Administered  . Influenza Whole 10/10/2012  . Influenza-Unspecified 11/04/2014  . Pneumococcal Conjugate-13 02/05/2014  . Pneumococcal Polysaccharide-23 07/10/2012  . Td 03/07/2006   Preventative care: Last colonoscopy: 03/2010 -Getting it this Friday  Prior vaccinations: TD : 2008 DUE wants at CPE Influenza: HD 2017 Pneumococcal: 07/2012 Prevnar/13: 02/05/2014 Shingles/Zostavax: declines due to cost  History reviewed: allergies, current medications, past family history, past medical history, past social history, past surgical history and problem list  Allergies Allergies  Allergen Reactions  . Ceftin [Cefuroxime Axetil] Hives  . Peanut-Containing Drug Products Other (See Comments)    Break out on nose     SURGICAL HISTORY He  has a past surgical history that includes Laparoscopic cholecystectomy (10/2010); Hammer toe surgery (2012); and  Anterior cervical decomp/discectomy fusion (N/A, 04/30/2014). FAMILY HISTORY His family history includes Alzheimer's disease in his father; Colon cancer (age of onset: 64) in his father and mother; Diabetes in his father; Ovarian cancer in his mother; Prostate cancer in his father. SOCIAL HISTORY He  reports that he has never smoked. He has never used smokeless tobacco. He reports that he does not drink alcohol or use drugs.'  MEDICARE WELLNESS OBJECTIVES: Physical activity: Current Exercise Habits: The patient does not participate in regular exercise at present Cardiac risk factors: Cardiac Risk Factors include: advanced age (>52men, >62 women);male gender;hypertension;dyslipidemia;obesity (BMI >30kg/m2);sedentary lifestyle Depression/mood screen:   Depression screen [facility] 2/9 04/25/2016  Decreased Interest 0  Down, Depressed, Hopeless 0  PHQ - 2 Score 0    ADLs:  In your present state of health, do you have any difficulty performing the following activities: 04/25/2016 10/07/2015  Hearing? N N  Vision? N N  Difficulty concentrating or making decisions? N N  Walking or climbing stairs? N N  Dressing or bathing? N N  Doing errands, shopping? N N  Some recent data might be hidden  Cognitive Testing  Alert? Yes  Normal Appearance?Yes  Oriented to person? Yes  Place? Yes   Time? Yes  Recall of three objects?  Yes  Can perform simple calculations? Yes  Displays appropriate judgment?Yes  Can read the correct time from a watch face?Yes  EOL planning: Does Patient Have a Medical Advance Directive?: Yes Type of Advance Directive: Healthcare Power of Attorney, Living will    Objective:     Blood pressure 126/84, pulse 64, temperature 97.2 F (36.2 C), resp. rate 16, height 5\' 10"  (1.778 m), weight 217 lb (98.4 kg).  Body mass index is 31.14 kg/m. General appearance: alert, no distress, WD/WN, male  HEENT: normocephalic, sclerae anicteric, TMs pearly, nares patent, no discharge or  erythema, pharynx normal Oral cavity: MMM, no lesions Neck: supple, no lymphadenopathy, no thyromegaly, no masses Heart: RRR, normal S1, S2, no murmurs Lungs: CTA bilaterally, no wheezes, rhonchi, or rales Abdomen: +bs, soft, non tender, non distended, no masses, no hepatomegaly, no splenomegaly Musculoskeletal: nontender, no swelling, no obvious deformity Extremities: no edema, no cyanosis, no clubbing Pulses: 2+ symmetric, upper and lower extremities, normal cap refill Neurological: alert, oriented x 3, CN2-12 intact, strength normal upper extremities and lower extremities, sensation normal throughout, DTRs 2+ throughout, no cerebellar signs, gait normal Psychiatric: normal affect, behavior normal, pleasant   Medicare Attestation I have personally reviewed: The patient's medical and social history Their use of alcohol, tobacco or illicit drugs Their current medications and supplements The patient's functional ability including ADLs,fall risks, home safety risks, cognitive, and hearing and visual impairment Diet and physical activities Evidence for depression or mood disorders  The patient's weight, height, BMI, and visual acuity have been recorded in the chart.  I have made referrals, counseling, and provided education to the patient based on review of the above and I have provided the patient with a written personalized care plan for preventive services.    Vicie Mutters, PA-C   04/25/2016

## 2016-04-23 NOTE — Patient Instructions (Addendum)
Simple math prevails.    1st - exercise does not produce significant weight loss - at best one converts fat into muscle , "bulks up", loses inches, but usually stays "weight neutral"     2nd - think of your body weightas a check book: If you eat more calories than you burn up - you save money or gain weight .... Or if you spend more money than you put in the check book, ie burn up more calories than you eat, then you lose weight     3rd - if you walk or run 1 mile, you burn up 100 calories - you have to burn up 3,500 calories to lose 1 pound, ie you have to walk/run 35 miles to lose 1 measly pound. So if you want to lose 10 #, then you have to walk/run 350 miles, so.... clearly exercise is not the solution.     4. So if you consume 1,500 calories, then you have to burn up the equivalent of 15 miles to stay weight neutral - It also stands to reason that if you consume 1,500 cal/day and don't lose weight, then you must be burning up about 1,500 cals/day to stay weight neutral.     5. If you really want to lose weight, you must cut your calorie intake 300 calories /day and at that rate you should lose about 1 # every 3 days.   6. Please purchase Dr Jeffrey Carey's book(s) "The End of Dieting" & "Eat to Live" . It has some great concepts and recipes.       Recommend Adult Low Dose Aspirin or  coated  Aspirin 81 mg daily  To reduce risk of Colon Cancer 20 %,  Skin Cancer 26 % ,  Melanoma 46%  and  Pancreatic cancer 60% +++++++++++++++++++++++++ Vitamin D goal  is between 70-100.  Please make sure that you are taking your Vitamin D as directed.  It is very important as a natural anti-inflammatory  helping hair, skin, and nails, as well as reducing stroke and heart attack risk.  It helps your bones and helps with mood. It also decreases numerous cancer risks so please take it as directed.  Low Vit D is associated with a 200-300% higher risk for CANCER  and 200-300% higher risk for HEART    ATTACK  &  STROKE.   .....................................Marland Kitchen It is also associated with higher death rate at younger ages,  autoimmune diseases like Rheumatoid arthritis, Lupus, Multiple Sclerosis.    Also many other serious conditions, like depression, Alzheimer's Dementia, infertility, muscle aches, fatigue, fibromyalgia - just to name a few. ++++++++++++++++++++ Recommend the book "The END of DIETING" by Dr Jeffrey Carey  & the book "The END of DIABETES " by Dr Jeffrey Carey At [email or web address] - get book & Audio CD's    Being diabetic has a  300% increased risk for heart attack, stroke, cancer, and alzheimer- type vascular dementia. It is very important that you work harder with diet by avoiding all foods that are white. Avoid white rice (brown & wild rice is OK), white potatoes (sweetpotatoes in moderation is OK), White bread or wheat bread or anything made out of white flour like bagels, donuts, rolls, buns, biscuits, cakes, pastries, cookies, pizza crust, and pasta (made from white flour & egg whites) - vegetarian pasta or spinach or wheat pasta is OK. Multigrain breads like Arnold's or Pepperidge [employer], or multigrain sandwich thins or flatbreads.  Diet, exercise and weight loss can reverse  and cure diabetes in the early stages.  Diet, exercise and weight loss is very important in the control and prevention of complications of diabetes which affects every system in your body, ie. Brain - dementia/stroke, eyes - glaucoma/blindness, heart - heart attack/heart failure, kidneys - dialysis, stomach - gastric paralysis, intestines - malabsorption, nerves - severe painful neuritis, circulation - gangrene & loss of a leg(s), and finally cancer and Alzheimers.    I recommend avoid fried & greasy foods,  sweets/candy, white rice (brown or wild rice or Quinoa is OK), white potatoes (sweet potatoes are OK) - anything made from white flour - bagels, doughnuts, rolls, buns, biscuits,white and wheat breads, pizza crust  and traditional pasta made of white flour & egg white(vegetarian pasta or spinach or wheat pasta is OK).  Multi-grain bread is OK - like multi-grain flat bread or sandwich thins. Avoid alcohol in excess. Exercise is also important.    Eat all the vegetables you want - avoid meat, especially red meat and dairy - especially cheese.  Cheese is the most concentrated form of trans-fats which is the worst thing to clog up our arteries. Veggie cheese is OK which can be found in the fresh produce section at [employer] or [employer] or [employer]  +++++++++++++++++++++ DASH Eating Plan  DASH stands for "Dietary Approaches to Stop Hypertension."   The DASH eating plan is a healthy eating plan that has been shown to reduce high blood pressure (hypertension). Additional health benefits may include reducing the risk of type 2 diabetes mellitus, heart disease, and stroke. The DASH eating plan may also help with weight loss. WHAT DO I NEED TO KNOW ABOUT THE DASH EATING PLAN? For the DASH eating plan, you will follow these general guidelines:  Choose foods with a percent daily value for sodium of less than 5% (as listed on the food label).  Use salt-free seasonings or herbs instead of table salt or sea salt.  Check with your health care provider or pharmacist before using salt substitutes.  Eat lower-sodium products, often labeled as "lower sodium" or "no salt added."  Eat fresh foods.  Eat more vegetables, fruits, and low-fat dairy products.  Choose whole grains. Look for the word "whole" as the first word in the ingredient list.  Choose fish   Limit sweets, desserts, sugars, and sugary drinks.  Choose heart-healthy fats.  Eat veggie cheese   Eat more home-cooked food and less restaurant, buffet, and fast food.  Limit fried foods.  Cook foods using methods other than frying.  Limit canned vegetables. If you do use them, rinse them well to decrease the sodium.  When eating at a  restaurant, ask that your food be prepared with less salt, or no salt if possible.                      WHAT FOODS CAN I EAT? Read Dr Jeffrey Carey's books on The End of Dieting & The End of Diabetes  Grains Whole grain or whole wheat bread. Brown rice. Whole grain or whole wheat pasta. Quinoa, bulgur, and whole grain cereals. Low-sodium cereals. Corn or whole wheat flour tortillas. Whole grain cornbread. Whole grain crackers. Low-sodium crackers.  Vegetables Fresh or frozen vegetables (raw, steamed, roasted, or grilled). Low-sodium or reduced-sodium tomato and vegetable juices. Low-sodium or reduced-sodium tomato sauce and paste. Low-sodium or reduced-sodium canned vegetables.   Fruits All fresh, canned (in natural juice), or frozen fruits.  Protein Products  All fish and seafood.  Dried beans, peas, or lentils. Unsalted nuts and seeds. Unsalted canned beans.  Dairy Low-fat dairy products, such as skim or 1% milk, 2% or reduced-fat cheeses, low-fat ricotta or cottage cheese, or plain low-fat yogurt. Low-sodium or reduced-sodium cheeses.  Fats and Oils Tub margarines without trans fats. Light or reduced-fat mayonnaise and salad dressings (reduced sodium). Avocado. Safflower, olive, or canola oils. Natural peanut or almond butter.  Other Unsalted popcorn and pretzels. The items listed above may not be a complete list of recommended foods or beverages. Contact your dietitian for more options.  +++++++++++++++  WHAT FOODS ARE NOT RECOMMENDED? Grains/ White flour or wheat flour White bread. White pasta. White rice. Refined cornbread. Bagels and croissants. Crackers that contain trans fat.  Vegetables  Creamed or fried vegetables. Vegetables in a . Regular canned vegetables. Regular canned tomato sauce and paste. Regular tomato and vegetable juices.  Fruits Dried fruits. Canned fruit in light or heavy syrup. Fruit juice.  Meat and Other Protein Products Meat in general - RED meat &  White meat.  Fatty cuts of meat. Ribs, chicken wings, all processed meats as bacon, sausage, bologna, salami, fatback, hot dogs, bratwurst and packaged luncheon meats.  Dairy Whole or 2% milk, cream, half-and-half, and cream cheese. Whole-fat or sweetened yogurt. Full-fat cheeses or blue cheese. Non-dairy creamers and whipped toppings. Processed cheese, cheese spreads, or cheese curds.  Condiments Onion and garlic salt, seasoned salt, table salt, and sea salt. Canned and packaged gravies. Worcestershire sauce. Tartar sauce. Barbecue sauce. Teriyaki sauce. Soy sauce, including reduced sodium. Steak sauce. Fish sauce. Oyster sauce. Cocktail sauce. Horseradish. Ketchup and mustard. Meat flavorings and tenderizers. Bouillon cubes. Hot sauce. Tabasco sauce. Marinades. Taco seasonings. Relishes.  Fats and Oils Butter, stick margarine, lard, shortening and bacon fat. Coconut, palm kernel, or palm oils. Regular salad dressings.  Pickles and olives. Salted popcorn and pretzels.  The items listed above may not be a complete list of foods and beverages to avoid.

## 2016-04-25 ENCOUNTER — Ambulatory Visit (INDEPENDENT_AMBULATORY_CARE_PROVIDER_SITE_OTHER): Payer: Medicare Other | Admitting: Physician Assistant

## 2016-04-25 ENCOUNTER — Encounter: Payer: Self-pay | Admitting: Physician Assistant

## 2016-04-25 VITALS — BP 126/84 | HR 64 | Temp 97.2°F | Resp 16 | Ht 70.0 in | Wt 217.0 lb

## 2016-04-25 DIAGNOSIS — E782 Mixed hyperlipidemia: Secondary | ICD-10-CM

## 2016-04-25 DIAGNOSIS — M5410 Radiculopathy, site unspecified: Secondary | ICD-10-CM

## 2016-04-25 DIAGNOSIS — Z79899 Other long term (current) drug therapy: Secondary | ICD-10-CM

## 2016-04-25 DIAGNOSIS — I10 Essential (primary) hypertension: Secondary | ICD-10-CM | POA: Diagnosis not present

## 2016-04-25 DIAGNOSIS — E559 Vitamin D deficiency, unspecified: Secondary | ICD-10-CM

## 2016-04-25 DIAGNOSIS — Z0001 Encounter for general adult medical examination with abnormal findings: Secondary | ICD-10-CM | POA: Diagnosis not present

## 2016-04-25 DIAGNOSIS — R6889 Other general symptoms and signs: Secondary | ICD-10-CM

## 2016-04-25 DIAGNOSIS — K219 Gastro-esophageal reflux disease without esophagitis: Secondary | ICD-10-CM | POA: Diagnosis not present

## 2016-04-25 DIAGNOSIS — R7303 Prediabetes: Secondary | ICD-10-CM | POA: Diagnosis not present

## 2016-04-25 DIAGNOSIS — Z86010 Personal history of colonic polyps: Secondary | ICD-10-CM

## 2016-04-25 DIAGNOSIS — E6609 Other obesity due to excess calories: Secondary | ICD-10-CM

## 2016-04-25 DIAGNOSIS — Z6831 Body mass index (BMI) 31.0-31.9, adult: Secondary | ICD-10-CM | POA: Diagnosis not present

## 2016-04-25 DIAGNOSIS — Z0000 Encounter for general adult medical examination without abnormal findings: Secondary | ICD-10-CM

## 2016-04-25 LAB — BASIC METABOLIC PANEL WITH GFR
BUN: 29 mg/dL — AB (ref 7–25)
CO2: 23 mmol/L (ref 20–31)
Calcium: 9.5 mg/dL (ref 8.6–10.3)
Chloride: 105 mmol/L (ref 98–110)
Creat: 1.65 mg/dL — ABNORMAL HIGH (ref 0.70–1.25)
GFR, EST AFRICAN AMERICAN: 48 mL/min — AB (ref >=60)
GFR, EST NON AFRICAN AMERICAN: 42 mL/min — AB (ref >=60)
GLUCOSE: 102 mg/dL — AB (ref 65–99)
POTASSIUM: 5.1 mmol/L (ref 3.5–5.3)
Sodium: 140 mmol/L (ref 135–146)

## 2016-04-25 LAB — HEPATIC FUNCTION PANEL
ALBUMIN: 4.2 g/dL (ref 3.6–5.1)
ALK PHOS: 60 U/L (ref 40–115)
ALT: 21 U/L (ref 9–46)
AST: 20 U/L (ref 10–35)
BILIRUBIN INDIRECT: 0.5 mg/dL (ref 0.2–1.2)
Bilirubin, Direct: 0.1 mg/dL (ref <=0.2)
TOTAL PROTEIN: 6.8 g/dL (ref 6.1–8.1)
Total Bilirubin: 0.6 mg/dL (ref 0.2–1.2)

## 2016-04-25 LAB — CBC WITH DIFFERENTIAL/PLATELET
BASOS PCT: 1 %
Basophils Absolute: 66 cells/uL (ref 0–200)
EOS ABS: 264 {cells}/uL (ref 15–500)
Eosinophils Relative: 4 %
HEMATOCRIT: 48.4 % (ref 38.5–50.0)
HEMOGLOBIN: 16.2 g/dL (ref 13.2–17.1)
LYMPHS PCT: 29 %
Lymphs Abs: 1914 cells/uL (ref 850–3900)
MCH: 29.9 pg (ref 27.0–33.0)
MCHC: 33.5 g/dL (ref 32.0–36.0)
MCV: 89.5 fL (ref 80.0–100.0)
MONO ABS: 528 {cells}/uL (ref 200–950)
MPV: 8.8 fL (ref 7.5–12.5)
Monocytes Relative: 8 %
NEUTROS PCT: 58 %
Neutro Abs: 3828 cells/uL (ref 1500–7800)
Platelets: 259 10*3/uL (ref 140–400)
RBC: 5.41 MIL/uL (ref 4.20–5.80)
RDW: 14.5 % (ref 11.0–15.0)
WBC: 6.6 10*3/uL (ref 3.8–10.8)

## 2016-04-25 LAB — LIPID PANEL
Cholesterol: 135 mg/dL (ref <200)
HDL: 32 mg/dL — ABNORMAL LOW (ref >40)
LDL CALC: 80 mg/dL (ref <100)
Total CHOL/HDL Ratio: 4.2 Ratio (ref <5.0)
Triglycerides: 113 mg/dL (ref <150)
VLDL: 23 mg/dL (ref <30)

## 2016-04-25 LAB — TSH: TSH: 2.55 m[IU]/L (ref 0.40–4.50)

## 2016-04-25 MED ORDER — PRAVASTATIN SODIUM 40 MG PO TABS: ORAL_TABLET | ORAL | 1 refills | Status: DC

## 2016-04-25 MED ORDER — ATENOLOL 100 MG PO TABS: ORAL_TABLET | ORAL | 1 refills | Status: DC

## 2016-04-26 LAB — VITAMIN D 25 HYDROXY (VIT D DEFICIENCY, FRACTURES): VIT D 25 HYDROXY: 79 ng/mL (ref 30–100)

## 2016-04-26 LAB — HEMOGLOBIN A1C
HEMOGLOBIN A1C: 5.3 % (ref <5.7)
MEAN PLASMA GLUCOSE: 105 mg/dL

## 2016-04-26 LAB — MAGNESIUM: MAGNESIUM: 2 mg/dL (ref 1.5–2.5)

## 2016-04-28 IMAGING — CR DG CHEST 2V
2 series · 2 of 2 positions shown · non-contrast
Comparison: PA and lateral chest x-ray November 22, 2010

CLINICAL DATA: Preoperative exam prior anterior cervical fusion

EXAM:
CHEST  2 VIEW

[w chest pa]
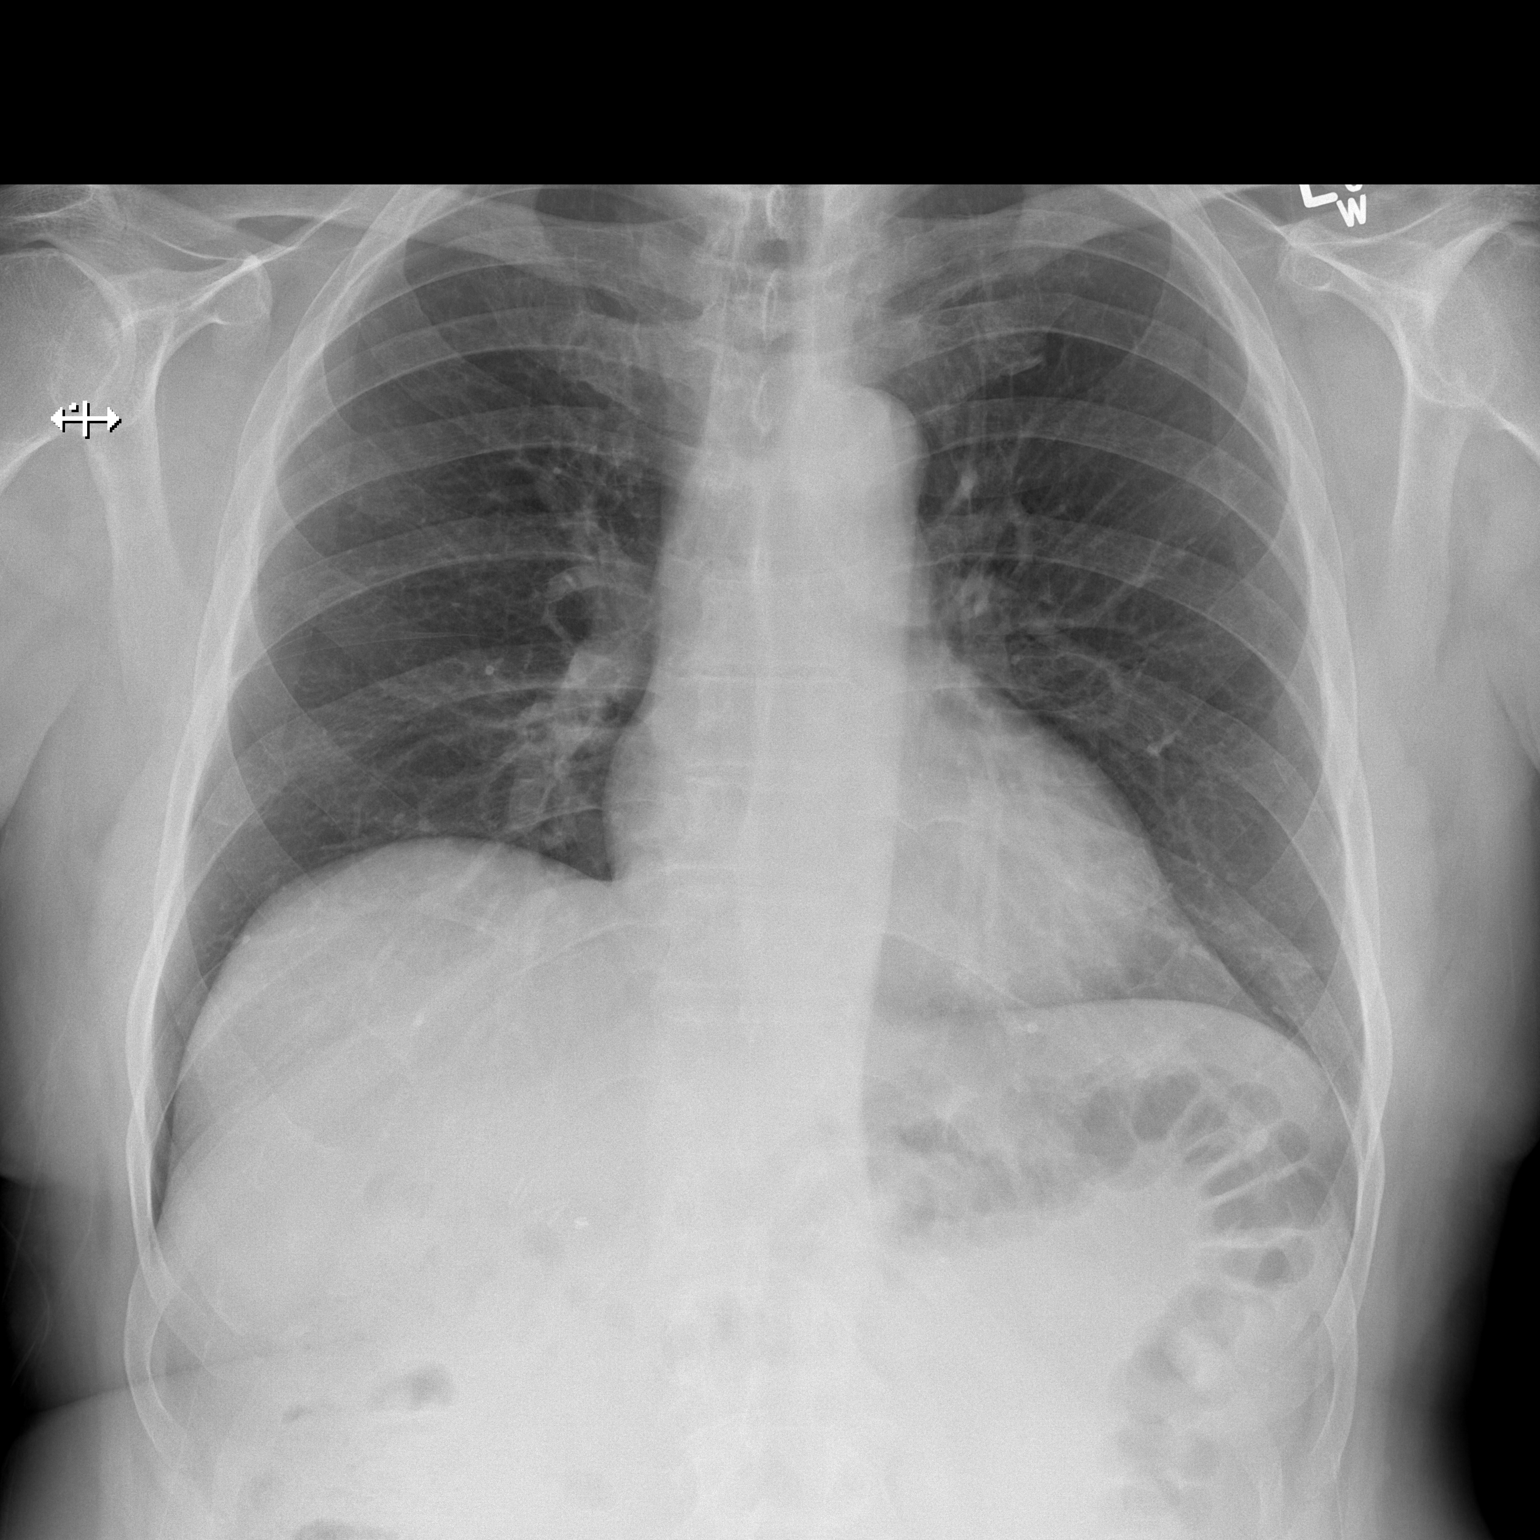

[w chest lat]
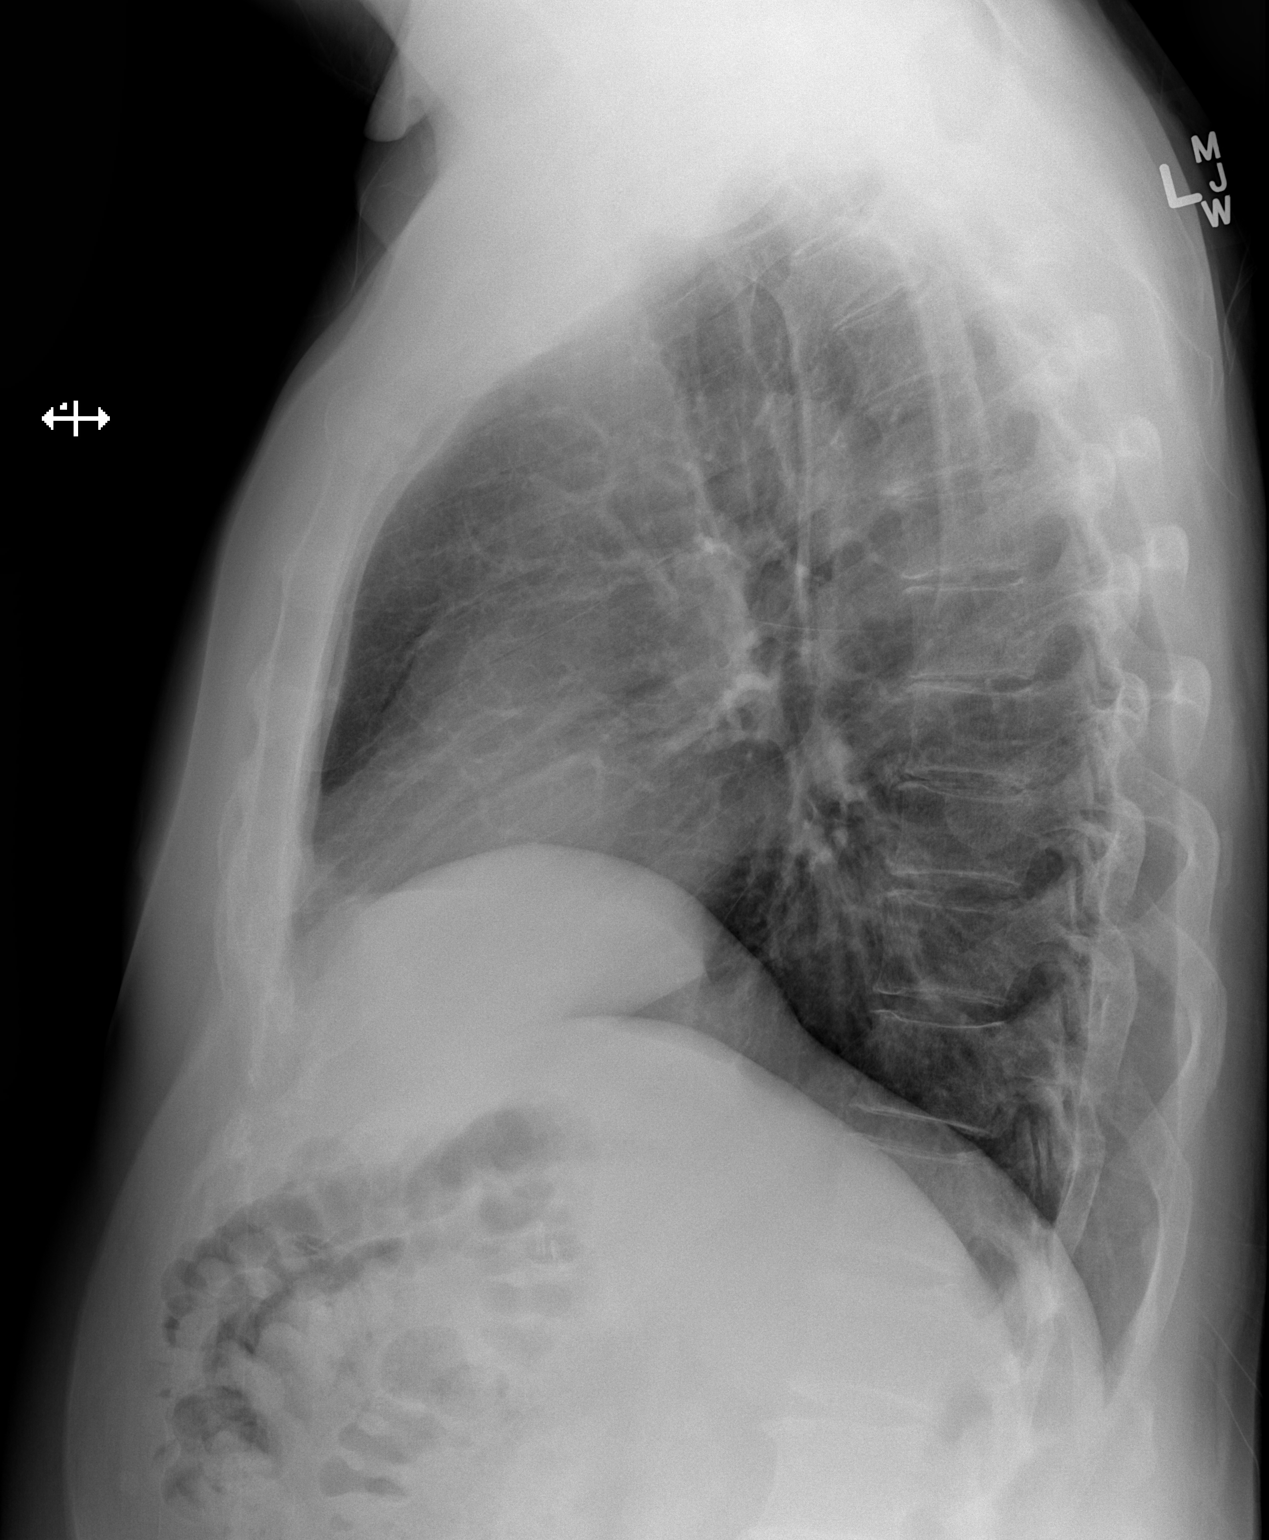

[2 of 2 positions shown; findings below may reference images not displayed]

FINDINGS: The lungs are adequately inflated and clear. The heart and pulmonary
vascularity are normal. The mediastinum is normal in width. There is
no pleural effusion. The trachea is midline. The bony thorax is
unremarkable.
IMPRESSION: There is no active cardiopulmonary disease.

## 2016-04-29 ENCOUNTER — Ambulatory Visit (AMBULATORY_SURGERY_CENTER): Payer: Medicare Other | Admitting: Gastroenterology

## 2016-04-29 ENCOUNTER — Encounter: Payer: Self-pay | Admitting: Gastroenterology

## 2016-04-29 VITALS — BP 116/68 | HR 51 | Temp 98.0°F | Resp 16 | Ht 71.0 in | Wt 217.0 lb

## 2016-04-29 DIAGNOSIS — K635 Polyp of colon: Secondary | ICD-10-CM

## 2016-04-29 DIAGNOSIS — Z86010 Personal history of colonic polyps: Secondary | ICD-10-CM

## 2016-04-29 DIAGNOSIS — D122 Benign neoplasm of ascending colon: Secondary | ICD-10-CM | POA: Diagnosis not present

## 2016-04-29 DIAGNOSIS — D123 Benign neoplasm of transverse colon: Secondary | ICD-10-CM | POA: Diagnosis not present

## 2016-04-29 IMAGING — RF DG CERVICAL SPINE 1V
1 series · 2 of 2 positions shown · IV contrast (agent unspecified)
Comparison: 03/20/2014.

CLINICAL DATA: C7-T1 ACDF.

EXAM:
DG C-ARM 61-120 MIN; DG CERVICAL SPINE - 1 VIEW
TECHNIQUE: Two images from portable C-arm radiography obtained in the operating
room were submitted.
CONTRAST:  None
FLUOROSCOPY TIME:  Radiation Exposure Index (as provided by the
fluoroscopic device):
If the device does not provide the exposure index:
Fluoroscopy Time (in minutes and seconds):  42 seconds
Number of Acquired Images:  2

[Series 1: run · 2 of 2 slices shown]
[im 1/2]
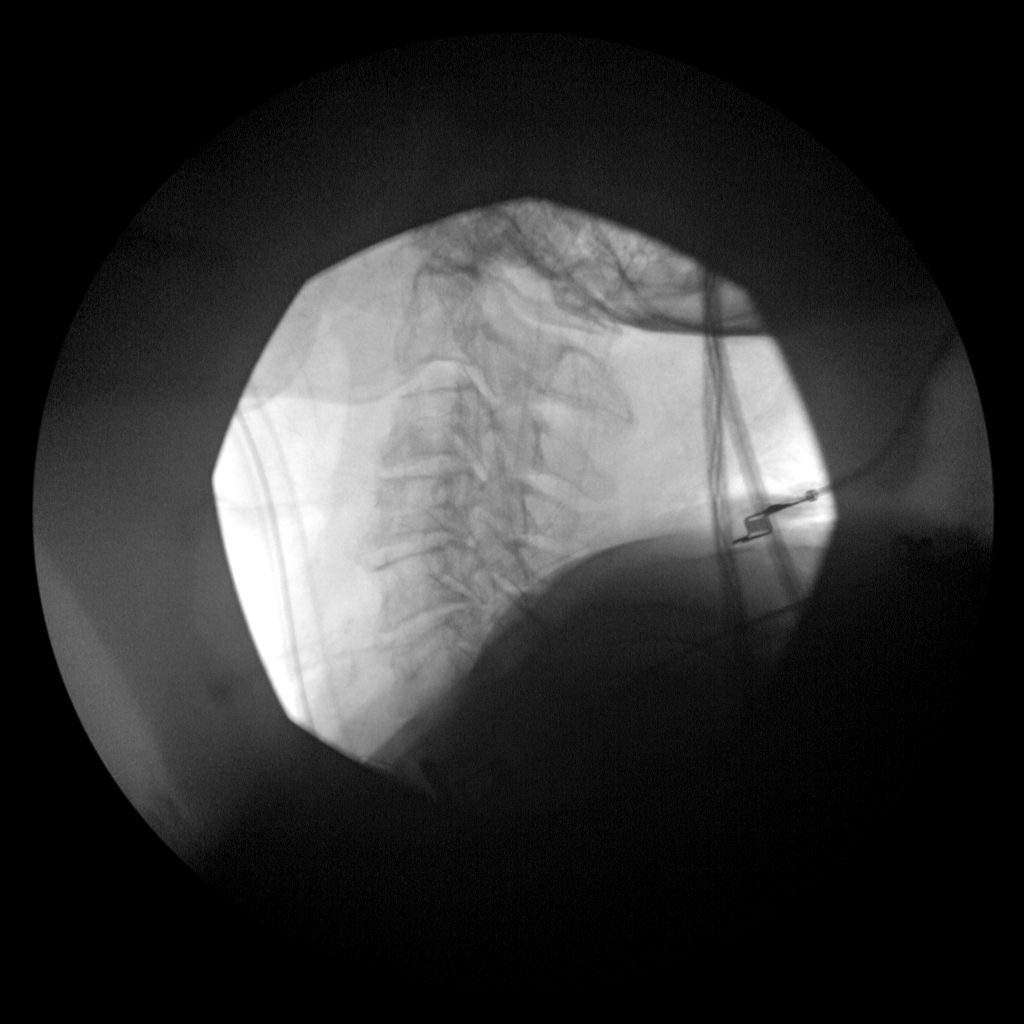
[im 2/2]
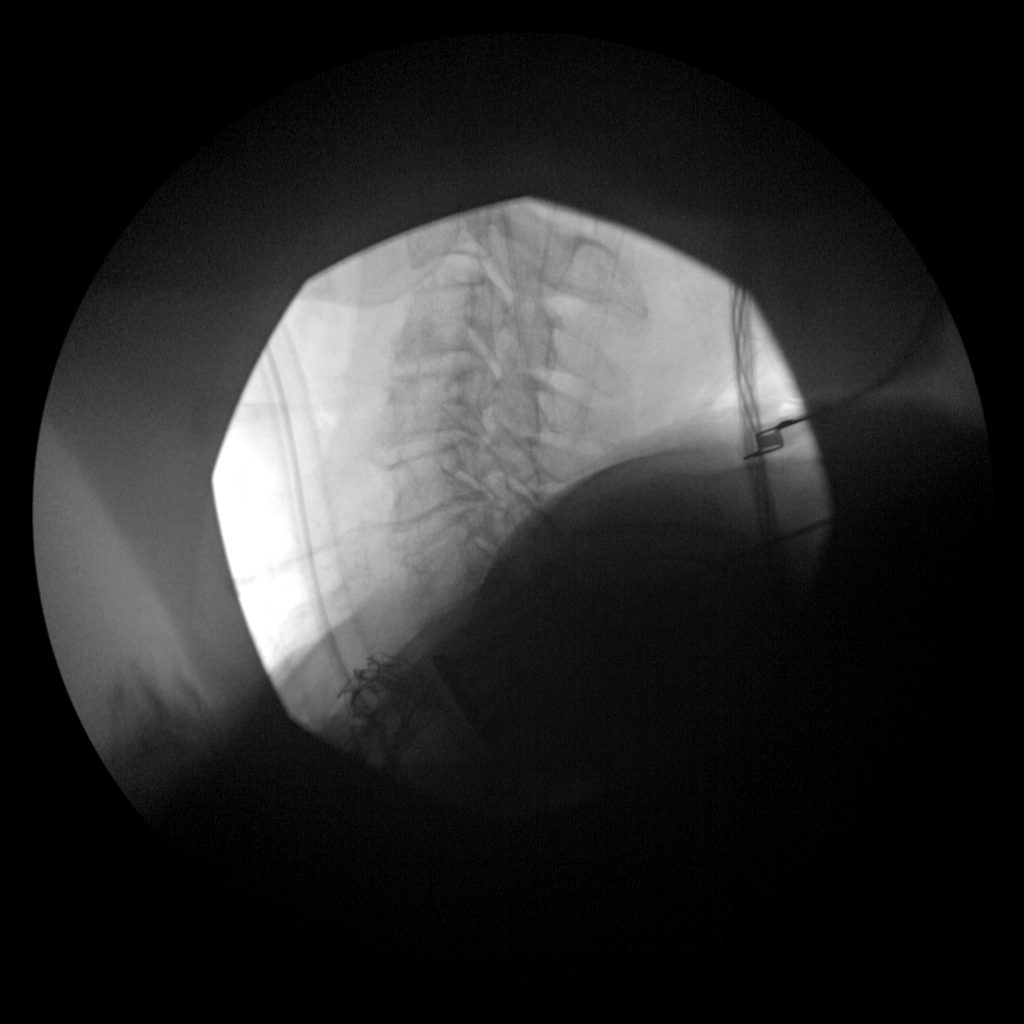

[2 of 2 positions shown; findings below may reference images not displayed]

FINDINGS: Images show anterior sideplate and screw device at the C7-T1 level
with inter mean bone graft material in the C7-T1 disc space. The
alignment appears anatomic. No complications identified.
IMPRESSION: 1. Status post ACDF of C7-T1.

## 2016-04-29 MED ORDER — SODIUM CHLORIDE 0.9 % IV SOLN: 500.0000 mL | INTRAVENOUS | Status: DC

## 2016-04-29 NOTE — Patient Instructions (Signed)
YOU HAD AN ENDOSCOPIC PROCEDURE TODAY AT [facility]:   Refer to the procedure report that was given to you for any specific questions about what was found during the examination.  If the procedure report does not answer your questions, please call your gastroenterologist to clarify.  If you requested that your care partner not be given the details of your procedure findings, then the procedure report has been included in a sealed envelope for you to review at your convenience later.  YOU SHOULD EXPECT: Some feelings of bloating in the abdomen. Passage of more gas than usual.  Walking can help get rid of the air that was put into your GI tract during the procedure and reduce the bloating. If you had a lower endoscopy (such as a colonoscopy or flexible sigmoidoscopy) you may notice spotting of blood in your stool or on the toilet paper. If you underwent a bowel prep for your procedure, you may not have a normal bowel movement for a few days.  Please Note:  You might notice some irritation and congestion in your nose or some drainage.  This is from the oxygen used during your procedure.  There is no need for concern and it should clear up in a day or so.  SYMPTOMS TO REPORT IMMEDIATELY:   Following lower endoscopy (colonoscopy or flexible sigmoidoscopy):  Excessive amounts of blood in the stool  Significant tenderness or worsening of abdominal pains  Swelling of the abdomen that is new, acute  Fever of 100F or higher    For urgent or emergent issues, a gastroenterologist can be reached at any hour by calling [phone].   DIET:  We do recommend a small meal at first, but then you may proceed to your regular diet.  Drink plenty of fluids but you should avoid alcoholic beverages for 24 hours.  ACTIVITY:  You should plan to take it easy for the rest of today and you should NOT DRIVE or use heavy machinery until tomorrow (because of the sedation medicines used during the test).     FOLLOW UP: Our staff will call the number listed on your records the next business day following your procedure to check on you and address any questions or concerns that you may have regarding the information given to you following your procedure. If we do not reach you, we will leave a message.  However, if you are feeling well and you are not experiencing any problems, there is no need to return our call.  We will assume that you have returned to your regular daily activities without incident.  If any biopsies were taken you will be contacted by phone or by letter within the next 1-3 weeks.  Please call us at [phone] if you have not heard about the biopsies in 3 weeks.    SIGNATURES/CONFIDENTIALITY: You and/or your care partner have signed paperwork which will be entered into your electronic medical record.  These signatures attest to the fact that that the information above on your After Visit Summary has been reviewed and is understood.  Full responsibility of the confidentiality of this discharge information lies with you and/or your care-partner.  Polyp information given.  No aspirin, ibuporofen, naproxen or other non-sterodial anti-inflammatory drugs for 5 days.

## 2016-04-29 NOTE — Progress Notes (Signed)
Called to room to assist during endoscopic procedure.  Patient ID and intended procedure confirmed with present staff. Received instructions for my participation in the procedure from the performing physician.  

## 2016-04-29 NOTE — Progress Notes (Signed)
Pt's states no medical or surgical changes since previsit or office visit. 

## 2016-04-29 NOTE — Op Note (Signed)
[facility] Patient Name: Jeffrey Carey Procedure Date: 04/29/2016 9:41 AM MRN: [id number] Endoscopist: [place]. [name] , MD Age: 71 Referring MD:  Date of Birth: October 02, 1945 Gender: Male Account #: [id number] Procedure:                Colonoscopy Indications:              Surveillance: Personal history of adenomatous                            polyps on last colonoscopy 5 years ago (diminutive                            TA 03/2011) Medicines:                Monitored Anesthesia Care Procedure:                Pre-Anesthesia Assessment:                           - Prior to the procedure, a History and Physical                            was performed, and patient medications and                            allergies were reviewed. The patient's tolerance of                            previous anesthesia was also reviewed. The risks                            and benefits of the procedure and the sedation                            options and risks were discussed with the patient.                            All questions were answered, and informed consent                            was obtained. Prior Anticoagulants: The patient has                            taken no previous anticoagulant or antiplatelet                            agents. ASA Grade Assessment: II - A patient with                            mild systemic disease. After reviewing the risks                            and benefits, the patient was deemed in  satisfactory condition to undergo the procedure.                           After obtaining informed consent, the colonoscope                            was passed under direct vision. Throughout the                            procedure, the patient's blood pressure, pulse, and                            oxygen saturations were monitored continuously. The                            Colonoscope was introduced through the anus and                      advanced to the the cecum, identified by                            appendiceal orifice and ileocecal valve. The                            colonoscopy was performed without difficulty. The                            patient tolerated the procedure well. The quality                            of the bowel preparation was good. The ileocecal                            valve, appendiceal orifice, and rectum were                            photographed. The quality of the bowel preparation                            was evaluated using the BBPS [facility] Bowel                            Preparation Scale) with scores of: Right Colon = 2,                            Transverse Colon = 2 and Left Colon = 2. The total                            BBPS score equals 6. The bowel preparation used was                            SUPREP. Scope In: 9:49:45 AM Scope Out: 10:09:31 AM Scope Withdrawal Time: 0 hours 18 minutes 15 seconds  Total Procedure Duration: 0 hours 19 minutes  46 seconds  Findings:                 The perianal and digital rectal examinations were                            normal.                           Six sessile polyps were found in the transverse                            colon and ascending colon. The polyps were 2 mm in                            size. These polyps were removed with a cold biopsy                            forceps. Resection and retrieval were complete.                           A 12 x 4 mm polyp was found in the hepatic flexure.                            The polyp was sessile (appearance of SSP). The                            polyp was removed with a cold snare. Resection and                            retrieval were complete.                           The exam was otherwise without abnormality on                            direct and retroflexion views. Complications:            No immediate complications. Estimated Blood Loss:      Estimated blood loss: none. Impression:               - Six 2 mm polyps in the transverse colon and in                            the ascending colon, removed with a cold biopsy                            forceps. Resected and retrieved.                           - One 12 x 4 mm polyp at the hepatic flexure,                            removed with a cold snare. Resected and retrieved.                           -  The examination was otherwise normal on direct                            and retroflexion views. Recommendation:           - Patient has a contact number available for                            emergencies. The signs and symptoms of potential                            delayed complications were discussed with the                            patient. Return to normal activities tomorrow.                            Written discharge instructions were provided to the                            patient.                           - Resume previous diet.                           - No aspirin, ibuprofen, naproxen, or other                            non-steroidal anti-inflammatory drugs for 5 days                            after polyp removal.                           - Await pathology results.                           - Repeat colonoscopy is recommended for                            surveillance. The colonoscopy date will be                            determined after pathology results from today's                            exam become available for review. Brendolyn Stockley L. [name], MD 04/29/2016 10:14:14 AM This report has been signed electronically.

## 2016-04-29 NOTE — Progress Notes (Signed)
To recovery, report to Scott, RN, VSS 

## 2016-05-02 ENCOUNTER — Telehealth: Payer: Self-pay | Admitting: *Deleted

## 2016-05-02 NOTE — Telephone Encounter (Signed)
  Follow up Call-  Call back number 04/29/2016  Post procedure Call Back phone  # [phone]  Permission to leave phone message Yes  Some recent data might be hidden     Patient questions:  Do you have a fever, pain , or abdominal swelling? No. Pain Score  0 *  Have you tolerated food without any problems? Yes.    Have you been able to return to your normal activities? Yes.    Do you have any questions about your discharge instructions: Diet   No. Medications  No. Follow up visit  No.  Do you have questions or concerns about your Care? No.  Actions: * If pain score is 4 or above: No action needed, pain <4.

## 2016-05-05 ENCOUNTER — Encounter: Payer: Self-pay | Admitting: Gastroenterology

## 2016-06-05 ENCOUNTER — Other Ambulatory Visit: Payer: Self-pay | Admitting: Internal Medicine

## 2016-10-07 DIAGNOSIS — H02834 Dermatochalasis of left upper eyelid: Secondary | ICD-10-CM | POA: Diagnosis not present

## 2016-10-07 DIAGNOSIS — H2513 Age-related nuclear cataract, bilateral: Secondary | ICD-10-CM | POA: Diagnosis not present

## 2016-10-07 DIAGNOSIS — H02831 Dermatochalasis of right upper eyelid: Secondary | ICD-10-CM | POA: Diagnosis not present

## 2016-11-06 NOTE — Patient Instructions (Addendum)

## 2016-11-06 NOTE — Progress Notes (Signed)
[facility]   [name], M.D.     [name]. [name], P.A.-C [name], [place]                [address] [place], [place] [address] Telephone [phone] Telefax [phone] Annual  Screening/Preventative Visit  & Comprehensive Evaluation & Examination     This very nice 71 y.o. MWM presents for a Screening/Preventative Visit & comprehensive evaluation and management of multiple medical co-morbidities.  Patient has been followed for HTN, Prediabetes, Hyperlipidemia and Vitamin D Deficiency. Patient's GERD ois controlled w/prudent diet & his meds.Also reports recent LBP and thigh pains R>L after a lot of bending mowing his church and a neighbor's yards.      HTN predates circa 2006. Patient's BP has been controlled at home.  Today's BP is at goal - 132/84. Patient denies any cardiac symptoms as chest pain, palpitations, shortness of breath, dizziness or ankle swelling.     Patient's hyperlipidemia is controlled with diet and medications. Patient denies myalgias or other medication SE's. Last lipids were at goal: Lab Results  Component Value Date   CHOL 135 04/25/2016   HDL 32 (L) 04/25/2016   LDLCALC 80 04/25/2016   TRIG 113 04/25/2016   CHOLHDL 4.2 04/25/2016      Patient has prediabetes  (A1c 5.7% in 2011) and patient denies reactive hypoglycemic symptoms, visual blurring, diabetic polys or paresthesias. Last A1c was normal & at goal: Lab Results  Component Value Date   HGBA1C 5.3 04/25/2016       Finally, patient has history of Vitamin D Deficiency ("24" in 2008)  and last vitamin D was at goal: Lab Results  Component Value Date   VD25OH 79 04/25/2016   Current Outpatient Medications on File Prior to Visit  Medication Sig  . aspirin 81 MG tablet Take 81 mg by mouth at bedtime.   Marland Kitchen atenolol (TENORMIN) 100 MG tablet TAKE 1 TABLET A DAY FOR BLOOD PRESSURE  . Cholecalciferol  (HM VITAMIN D3) 2000 UNITS CAPS Take 2,000 Units by mouth at bedtime.   Marland Kitchen CINNAMON PO Take 1,000 mg by mouth daily.  . Flaxseed, Linseed, (FLAXSEED OIL PO) Take 1 capsule by mouth daily.  . Magnesium 250 MG TABS Take by mouth.  . Omega-3 Fatty Acids (FISH OIL PO) Take 1,200 mg by mouth at bedtime.   Marland Kitchen omeprazole (PRILOSEC) 20 MG capsule Take 1 capsule (20 mg total) by mouth daily.  . pravastatin (PRAVACHOL) 40 MG tablet TAKE 1 TABLET (40 MG TOTAL) BY MOUTH DAILY.  . sildenafil (REVATIO) 20 MG tablet Take 2 tablets as needed  . azelastine (ASTELIN) 0.1 % nasal spray Place 2 sprays into both nostrils 2 (two) times daily. Use in each nostril as directed   No current facility-administered medications on file prior to visit.    Allergies  Allergen Reactions  . Ceftin [Cefuroxime Axetil] Hives  . Peanut-Containing Drug Products Other (See Comments)    Break out on nose    Past Medical History:  Diagnosis Date  . Chronic kidney disease   . Dyslipidemia   . ED (erectile dysfunction)   . GERD (gastroesophageal reflux disease)   . History of kidney stones   . Hyperlipidemia   . Hypertension   . Vitamin D deficiency    Health Maintenance  Topic Date Due  . TETANUS/TDAP  06/14/2016  .  INFLUENZA VACCINE  08/03/2016  . Hepatitis C Screening  01/03/2018 (Originally September 21, 1945)  . COLONOSCOPY  04/30/2019  . PNA vac Low Risk Adult  Completed   Immunization History  Administered Date(s) Administered  . Influenza Whole 10/10/2012  . Influenza-Unspecified 11/04/2014  . Pneumococcal Conjugate-13 02/05/2014  . Pneumococcal Polysaccharide-23 07/10/2012  . Td 03/07/2006   Past Surgical History:  Procedure Laterality Date  . HAMMER TOE SURGERY  2012   left foot, 3rd toe  . LAPAROSCOPIC CHOLECYSTECTOMY  10/2010   Family History  Problem Relation Age of Onset  . Colon cancer Mother 75  . Ovarian cancer Mother   . Colon cancer Father 54  . Diabetes Father   . Alzheimer's disease Father    . Prostate cancer Father   . Stomach cancer Neg Hx    Social History   Socioeconomic History  . Marital status: Married    Spouse name: Not on file  . Number of children: 1  . Years of education: Not on file  . Highest education level: Not on file  Tobacco Use  . Smoking status: Never Smoker  . Smokeless tobacco: Never Used  Substance and Sexual Activity  . Alcohol use: No  . Drug use: No  . Sexual activity: Not on file  Social History Narrative   Lives with wife.  Was a Best boy.      ROS Constitutional: Denies fever, chills, weight loss/gain, headaches, insomnia,  night sweats or change in appetite. Does c/o fatigue. Eyes: Denies redness, blurred vision, diplopia, discharge, itchy or watery eyes.  ENT: Denies discharge, congestion, post nasal drip, epistaxis, sore throat, earache, hearing loss, dental pain, Tinnitus, Vertigo, Sinus pain or snoring.  Cardio: Denies chest pain, palpitations, irregular heartbeat, syncope, dyspnea, diaphoresis, orthopnea, PND, claudication or edema Respiratory: denies cough, dyspnea, DOE, pleurisy, hoarseness, laryngitis or wheezing.  Gastrointestinal: Denies dysphagia, heartburn, reflux, water brash, pain, cramps, nausea, vomiting, bloating, diarrhea, constipation, hematemesis, melena, hematochezia, jaundice or hemorrhoids Genitourinary: Denies dysuria, frequency, urgency, nocturia, hesitancy, discharge, hematuria or flank pain Musculoskeletal: Denies arthralgia, myalgia, stiffness, Jt. Swelling, pain, limp or strain/sprain. Denies Falls. Skin: Denies puritis, rash, hives, warts, acne, eczema or change in skin lesion Neuro: No weakness, tremor, incoordination, spasms, paresthesia or pain Psychiatric: Denies confusion, memory loss or sensory loss. Denies Depression. Endocrine: Denies change in weight, skin, hair change, nocturia, and paresthesia, diabetic polys, visual blurring or hyper / hypo glycemic episodes.  Heme/Lymph: No  excessive bleeding, bruising or enlarged lymph nodes.  Physical Exam  BP 132/84   Pulse 64   Temp (!) 97.2 F (36.2 C)   Resp 18   Ht 5\' 10"  (1.778 m)   Wt 220 lb 6.4 oz (100 kg)   BMI 31.62 kg/m   General Appearance: Well nourished and well groomed and in no apparent distress.  Eyes: PERRLA, EOMs, conjunctiva no swelling or erythema, normal fundi and vessels. Sinuses: No frontal/maxillary tenderness ENT/Mouth: EACs patent / TMs  nl. Nares clear without erythema, swelling, mucoid exudates. Oral hygiene is good. No erythema, swelling, or exudate. Tongue normal, non-obstructing. Tonsils not swollen or erythematous. Hearing normal.  Neck: Supple, thyroid normal. No bruits, nodes or JVD. Respiratory: Respiratory effort normal.  BS equal and clear bilateral without rales, rhonci, wheezing or stridor. Cardio: Heart sounds are normal with regular rate and rhythm and no murmurs, rubs or gallops. Peripheral pulses are normal and equal bilaterally without edema. No aortic or femoral bruits. Chest: symmetric with normal excursions and percussion.  Abdomen: Soft,  with Nl bowel sounds. Nontender, no guarding, rebound, hernias, masses, or organomegaly.  Lymphatics: Non tender without lymphadenopathy.  Genitourinary: No hernias.Testes nl. DRE - prostate nl for age - smooth & firm w/o nodules. Musculoskeletal: Full ROM all peripheral extremities, joint stability, 5/5 strength, and normal gait. Skin: Warm and dry without rashes, lesions, cyanosis, clubbing or  ecchymosis.  Neuro: Cranial nerves intact, reflexes equal bilaterally. Normal muscle tone, no cerebellar symptoms. Sensation intact.  Pysch: Alert and oriented X 3 with normal affect, insight and judgment appropriate.   Assessment and Plan  1. Annual Preventative/Screening Exam   2. Essential hypertension  - EKG 12-Lead - [place], RETROPERITNL ABD,  LTD - Urinalysis, Routine w reflex microscopic - Microalbumin / creatinine urine ratio -  BASIC METABOLIC PANEL WITH GFR - CBC with Differential/Platelet - Magnesium - TSH  3. Hyperlipidemia, mixed  - EKG 12-Lead - [place], RETROPERITNL ABD,  LTD - Hepatic function panel - Lipid panel - TSH  4. Prediabetes  - EKG 12-Lead - [place], RETROPERITNL ABD,  LTD - Hemoglobin A1c - Insulin, random  5. Vitamin D deficiency  - VITAMIN D 25 Hydroxy   6. Gastroesophageal reflux disease  - CBC with Differential/Platelet  7. Prostate cancer screening  - PSA  8. Screening for ischemic heart disease  - EKG 12-Lead  9. Screening for AAA (aortic abdominal aneurysm)  - [place], RETROPERITNL ABD,  LTD  10. Medication management  - Urinalysis, Routine w reflex microscopic - Microalbumin / creatinine urine ratio - BASIC METABOLIC PANEL WITH GFR - CBC with Differential/Platelet - Hepatic function panel - Magnesium - Lipid panel - TSH - Hemoglobin A1c - Insulin, random - VITAMIN D 25 Hydroxy  11. Needs flu shot  - Flu vaccine HIGH DOSE PF         Patient was counseled in prudent diet, weight control to achieve/maintain BMI less than 25, BP monitoring, regular exercise and medications as discussed.  Discussed med effects and SE's. Routine screening labs and tests as requested with regular follow-up as recommended. Over 40 minutes of exam, counseling, chart review and high complex critical decision making was performed

## 2016-11-07 ENCOUNTER — Ambulatory Visit: Payer: Medicare Other | Admitting: Internal Medicine

## 2016-11-07 ENCOUNTER — Encounter: Payer: Self-pay | Admitting: Internal Medicine

## 2016-11-07 VITALS — BP 132/84 | HR 64 | Temp 97.2°F | Resp 18 | Ht 70.0 in | Wt 220.4 lb

## 2016-11-07 DIAGNOSIS — E782 Mixed hyperlipidemia: Secondary | ICD-10-CM

## 2016-11-07 DIAGNOSIS — Z136 Encounter for screening for cardiovascular disorders: Secondary | ICD-10-CM

## 2016-11-07 DIAGNOSIS — Z79899 Other long term (current) drug therapy: Secondary | ICD-10-CM

## 2016-11-07 DIAGNOSIS — Z0000 Encounter for general adult medical examination without abnormal findings: Secondary | ICD-10-CM

## 2016-11-07 DIAGNOSIS — I10 Essential (primary) hypertension: Secondary | ICD-10-CM

## 2016-11-07 DIAGNOSIS — Z23 Encounter for immunization: Secondary | ICD-10-CM

## 2016-11-07 DIAGNOSIS — R7303 Prediabetes: Secondary | ICD-10-CM

## 2016-11-07 DIAGNOSIS — Z125 Encounter for screening for malignant neoplasm of prostate: Secondary | ICD-10-CM

## 2016-11-07 DIAGNOSIS — E559 Vitamin D deficiency, unspecified: Secondary | ICD-10-CM

## 2016-11-07 DIAGNOSIS — Z0001 Encounter for general adult medical examination with abnormal findings: Secondary | ICD-10-CM

## 2016-11-07 DIAGNOSIS — K219 Gastro-esophageal reflux disease without esophagitis: Secondary | ICD-10-CM

## 2016-11-07 MED ORDER — CYCLOBENZAPRINE HCL 10 MG PO TABS: ORAL_TABLET | ORAL | 1 refills | Status: DC

## 2016-11-07 MED ORDER — PREDNISONE 20 MG PO TABS: ORAL_TABLET | ORAL | 0 refills | Status: DC

## 2016-11-08 LAB — URINALYSIS, ROUTINE W REFLEX MICROSCOPIC
Bilirubin Urine: NEGATIVE
Glucose, UA: NEGATIVE
Hgb urine dipstick: NEGATIVE
Ketones, ur: NEGATIVE
Leukocytes, UA: NEGATIVE
NITRITE: NEGATIVE
Protein, ur: NEGATIVE
SPECIFIC GRAVITY, URINE: 1.009 (ref 1.001–1.03)

## 2016-11-08 LAB — CBC WITH DIFFERENTIAL/PLATELET
BASOS PCT: 0.8 %
Basophils Absolute: 52 cells/uL (ref 0–200)
Eosinophils Absolute: 189 cells/uL (ref 15–500)
Eosinophils Relative: 2.9 %
HCT: 49.8 % (ref 38.5–50.0)
Hemoglobin: 16.9 g/dL (ref 13.2–17.1)
Lymphs Abs: 1749 cells/uL (ref 850–3900)
MCH: 29.4 pg (ref 27.0–33.0)
MCHC: 33.9 g/dL (ref 32.0–36.0)
MCV: 86.8 fL (ref 80.0–100.0)
MONOS PCT: 7.4 %
MPV: 8.8 fL (ref 7.5–12.5)
NEUTROS ABS: 4030 {cells}/uL (ref 1500–7800)
Neutrophils Relative %: 62 %
PLATELETS: 283 10*3/uL (ref 140–400)
RBC: 5.74 10*6/uL (ref 4.20–5.80)
RDW: 13 % (ref 11.0–15.0)
TOTAL LYMPHOCYTE: 26.9 %
WBC mixed population: 481 cells/uL (ref 200–950)
WBC: 6.5 10*3/uL (ref 3.8–10.8)

## 2016-11-08 LAB — VITAMIN D 25 HYDROXY (VIT D DEFICIENCY, FRACTURES): Vit D, 25-Hydroxy: 81 ng/mL (ref 30–100)

## 2016-11-08 LAB — HEPATIC FUNCTION PANEL
AG Ratio: 1.6 (calc) (ref 1.0–2.5)
ALBUMIN MSPROF: 4.4 g/dL (ref 3.6–5.1)
ALT: 21 U/L (ref 9–46)
AST: 18 U/L (ref 10–35)
Alkaline phosphatase (APISO): 68 U/L (ref 40–115)
BILIRUBIN DIRECT: 0.1 mg/dL (ref 0.0–0.2)
BILIRUBIN TOTAL: 0.6 mg/dL (ref 0.2–1.2)
Globulin: 2.8 g/dL (calc) (ref 1.9–3.7)
Indirect Bilirubin: 0.5 mg/dL (calc) (ref 0.2–1.2)
TOTAL PROTEIN: 7.2 g/dL (ref 6.1–8.1)

## 2016-11-08 LAB — LIPID PANEL
Cholesterol: 154 mg/dL (ref <200)
HDL: 38 mg/dL — ABNORMAL LOW (ref >40)
LDL Cholesterol (Calc): 95 mg/dL (calc)
NON-HDL CHOLESTEROL (CALC): 116 mg/dL (ref <130)
TRIGLYCERIDES: 116 mg/dL (ref <150)
Total CHOL/HDL Ratio: 4.1 (calc) (ref <5.0)

## 2016-11-08 LAB — INSULIN, RANDOM: Insulin: 20.3 u[IU]/mL — ABNORMAL HIGH (ref 2.0–19.6)

## 2016-11-08 LAB — BASIC METABOLIC PANEL WITH GFR
BUN / CREAT RATIO: 15 (calc) (ref 6–22)
BUN: 25 mg/dL (ref 7–25)
CALCIUM: 9.6 mg/dL (ref 8.6–10.3)
CHLORIDE: 102 mmol/L (ref 98–110)
CO2: 28 mmol/L (ref 20–32)
CREATININE: 1.66 mg/dL — AB (ref 0.70–1.18)
GFR, EST AFRICAN AMERICAN: 48 mL/min/{1.73_m2} — AB (ref >=60)
GFR, EST NON AFRICAN AMERICAN: 41 mL/min/{1.73_m2} — AB (ref >=60)
Glucose, Bld: 92 mg/dL (ref 65–99)
Potassium: 4.7 mmol/L (ref 3.5–5.3)
Sodium: 138 mmol/L (ref 135–146)

## 2016-11-08 LAB — MICROALBUMIN / CREATININE URINE RATIO
CREATININE, URINE: 82 mg/dL (ref 20–320)
MICROALB UR: 0.3 mg/dL
MICROALB/CREAT RATIO: 4 ug/mg{creat} (ref <30)

## 2016-11-08 LAB — HEMOGLOBIN A1C
Hgb A1c MFr Bld: 5.5 % of total Hgb (ref <5.7)
Mean Plasma Glucose: 111 (calc)
eAG (mmol/L): 6.2 (calc)

## 2016-11-08 LAB — TSH: TSH: 2.32 mIU/L (ref 0.40–4.50)

## 2016-11-08 LAB — MAGNESIUM: Magnesium: 2 mg/dL (ref 1.5–2.5)

## 2016-11-08 LAB — PSA: PSA: 2.1 ng/mL (ref <=4.0)

## 2016-12-15 ENCOUNTER — Other Ambulatory Visit: Payer: Self-pay | Admitting: *Deleted

## 2016-12-15 DIAGNOSIS — Z1212 Encounter for screening for malignant neoplasm of rectum: Secondary | ICD-10-CM | POA: Diagnosis not present

## 2016-12-15 DIAGNOSIS — Z86010 Personal history of colonic polyps: Secondary | ICD-10-CM

## 2016-12-15 LAB — POC HEMOCCULT BLD/STL (HOME/3-CARD/SCREEN)
Card #3 Fecal Occult Blood, POC: NEGATIVE
FECAL OCCULT BLD: NEGATIVE
Fecal Occult Blood, POC: NEGATIVE

## 2017-02-06 ENCOUNTER — Encounter: Payer: Self-pay | Admitting: Adult Health

## 2017-02-06 ENCOUNTER — Ambulatory Visit: Payer: Medicare Other | Admitting: Adult Health

## 2017-02-06 VITALS — BP 138/88 | HR 58 | Temp 97.9°F | Ht 70.0 in | Wt 214.0 lb

## 2017-02-06 DIAGNOSIS — J40 Bronchitis, not specified as acute or chronic: Secondary | ICD-10-CM | POA: Diagnosis not present

## 2017-02-06 MED ORDER — AZITHROMYCIN 250 MG PO TABS: ORAL_TABLET | ORAL | 1 refills | Status: AC

## 2017-02-06 MED ORDER — PREDNISONE 20 MG PO TABS: ORAL_TABLET | ORAL | 0 refills | Status: DC

## 2017-02-06 MED ORDER — AZITHROMYCIN 250 MG PO TABS: ORAL_TABLET | ORAL | 1 refills | Status: DC

## 2017-02-06 MED ORDER — PROMETHAZINE-DM 6.25-15 MG/5ML PO SYRP: 5.0000 mL | ORAL_SOLUTION | Freq: Four times a day (QID) | ORAL | 1 refills | Status: DC | PRN

## 2017-02-06 NOTE — Progress Notes (Signed)
Assessment and Plan:  Jeffrey Carey was seen today for wheezing, cough and facial pain.  Diagnoses and all orders for this visit:  Bronchitis - Discussed the importance of avoiding unnecessary antibiotic therapy. Suggested symptomatic OTC remedies. Nasal saline spray for congestion. Nasal steroids, allergy pill, oral steroids Follow up as needed. -     predniSONE (DELTASONE) 20 MG tablet; 1 tab 3 x day for 3 days, then 1 tab 2 x day for 3 days, then 1 tab 1 x day for 5 days -     promethazine-dextromethorphan (PROMETHAZINE-DM) 6.25-15 MG/5ML syrup; Take 5 mLs by mouth 4 (four) times daily as needed for cough.  Take if not improving in 3-5 days or with increasingly productive cough or fever:  -     azithromycin (ZITHROMAX) 250 MG tablet; Take 2 tablets (500 mg) on  Day 1,  followed by 1 tablet (250 mg) once daily on Days 2 through 5.  Further disposition pending results of labs. Discussed med's effects and SE's.   Over 15 minutes of exam, counseling, chart review, and critical decision making was performed.   Future Appointments  Date Time Provider [place]  02/10/2017 10:45 AM [name], NP [facility] None  05/11/2017 10:30 AM [name], MD [facility] None  12/19/2017 10:00 AM [name], MD [facility] None    ------------------------------------------------------------------------------------------------------------------   HPI BP 138/88   Pulse (!) 58   Temp 97.9 F (36.6 C)   Ht 5\' 10"  (1.778 m)   Wt 214 lb (97.1 kg)   SpO2 99%   BMI 30.71 kg/m   72 y.o.male presents for URI symptoms ongoing for 3 days; he reports scratchy throat, then progressed to chest congestion with cough; not currently productive - he endorses some wheezing without dyspnea or chest pain when supine. He denies fatigue/malaise, fever chills. He does endorse some mild facial pressure last week. He endorses some crackles in ears - He has been using flonase since last week.   He has  taken 1 dose of mucinex; no significant cardiac/respiratory hx; never smoker; has had flu vaccine and pneumonia vaccinations. He does not currently take an allergy medication; does endorse some sneezing.    Past Medical History:  Diagnosis Date  . Chronic kidney disease   . Dyslipidemia   . ED (erectile dysfunction)   . GERD (gastroesophageal reflux disease)   . History of kidney stones   . Hyperlipidemia   . Hypertension   . Vitamin D deficiency      Allergies  Allergen Reactions  . Ceftin [Cefuroxime Axetil] Hives  . Peanut-Containing Drug Products Other (See Comments)    Break out on nose     Current Outpatient Medications on File Prior to Visit  Medication Sig  . aspirin 81 MG tablet Take 81 mg by mouth at bedtime.   Marland Kitchen atenolol (TENORMIN) 100 MG tablet TAKE 1 TABLET A DAY FOR BLOOD PRESSURE  . azelastine (ASTELIN) 0.1 % nasal spray Place 2 sprays into both nostrils 2 (two) times daily. Use in each nostril as directed  . Cholecalciferol (HM VITAMIN D3) 2000 UNITS CAPS Take 2,000 Units by mouth at bedtime.   Marland Kitchen CINNAMON PO Take 1,000 mg by mouth daily.  . Flaxseed, Linseed, (FLAXSEED OIL PO) Take 1 capsule by mouth daily.  . Magnesium 250 MG TABS Take by mouth.  . Omega-3 Fatty Acids (FISH OIL PO) Take 1,200 mg by mouth at bedtime.   Marland Kitchen omeprazole (PRILOSEC) 20 MG capsule Take 1 capsule (20 mg total) by  mouth daily.  . pravastatin (PRAVACHOL) 40 MG tablet TAKE 1 TABLET (40 MG TOTAL) BY MOUTH DAILY.  . sildenafil (REVATIO) 20 MG tablet Take 2 tablets as needed (Patient taking differently: Take 20 mg by mouth as needed. Take 1 tablet as needed)  . cyclobenzaprine (FLEXERIL) 10 MG tablet Take 1/2 to 1 tablet 2 to 3 x / day as needed for muscle spasms (Patient not taking: Reported on 02/06/2017)  . predniSONE (DELTASONE) 20 MG tablet 1 tab 3 x day for 3 days, then 1 tab 2 x day for 3 days, then 1 tab 1 x day for 5 days   No current facility-administered medications on file prior to  visit.     ROS: all negative except above.   Physical Exam:  BP 138/88   Pulse (!) 58   Temp 97.9 F (36.6 C)   Ht 5\' 10"  (1.778 m)   Wt 214 lb (97.1 kg)   SpO2 99%   BMI 30.71 kg/m   General Appearance: Well nourished, in no apparent distress. Eyes: PERRLA, EOMs, conjunctiva no swelling or erythema Sinuses: No Frontal/maxillary tenderness ENT/Mouth: Ext aud canals clear, TMs without erythema, bulging. No erythema, swelling, or exudate on post pharynx.  Tonsils not swollen or erythematous. Hearing normal.  Neck: Supple, thyroid normal.  Respiratory: Respiratory effort normal, BS equal bilaterally with some rhonchi throughout, worse on R upper - no rales, fine wheezing or stridor.  Cardio: RRR with no MRGs. Brisk peripheral pulses without edema.  Abdomen: Soft, + BS.  Non tender, no guarding. Lymphatics: Non tender without lymphadenopathy.  Musculoskeletal: Full ROM, 5/5 strength, normal gait.  Skin: Warm, dry without rashes, lesions, ecchymosis.   Psych: Awake and oriented X 3, normal affect, Insight and Judgment appropriate.     Izora Ribas, NP 9:20 AM [facility]

## 2017-02-06 NOTE — Patient Instructions (Signed)
Continue mucinex and flonase  Consider starting allergy medication  Start prednisone and cough medication  Start antibiotic with worsening symptoms     HOW TO TREAT VIRAL COUGH AND COLD SYMPTOMS:  -Symptoms usually last at least 1 week with the worst symptoms being around day 4.  - colds usually start with a sore throat and end with a cough, and the cough can take 2 weeks to get better.  -No antibiotics are needed for colds, flu, sore throats, cough, bronchitis UNLESS symptoms are longer than 7 days OR if you are getting better then get drastically worse.  -There are a lot of combination medications (Dayquil, Nyquil, Vicks 44, tyelnol cold and sinus, ETC). Please look at the ingredients on the back so that you are treating the correct symptoms and not doubling up on medications/ingredients.    Medicines you can use  Nasal congestion  Little Remedies saline spray (aerosol/mist)- can try this, it is in the kids section - pseudoephedrine (Sudafed)- behind the counter, do not use if you have high blood pressure, medicine that have -D in them.  - phenylephrine (Sudafed PE) -Dextormethorphan + chlorpheniramine (Coridcidin HBP)- okay if you have high blood pressure -Oxymetazoline (Afrin) nasal spray- LIMIT to 3 days -Saline nasal spray -Neti pot (used distilled or bottled water)  Ear pain/congestion  -pseudoephedrine (sudafed) - Nasonex/flonase nasal spray  Fever  -Acetaminophen (Tyelnol) -Ibuprofen (Advil, motrin, aleve)  Sore Throat  -Acetaminophen (Tyelnol) -Ibuprofen (Advil, motrin, aleve) -Drink a lot of water -Gargle with salt water - Rest your voice (don't talk) -Throat sprays -Cough drops  Body Aches  -Acetaminophen (Tyelnol) -Ibuprofen (Advil, motrin, aleve)  Headache  -Acetaminophen (Tyelnol) -Ibuprofen (Advil, motrin, aleve) - Exedrin, Exedrin Migraine  Allergy symptoms (cough, sneeze, runny nose, itchy eyes) -Claritin or loratadine cheapest but likely the  weakest  -Zyrtec or certizine at night because it can make you sleepy -The strongest is allegra or fexafinadine  Cheapest at [employer], [employer], [employer]  Cough  -Dextromethorphan (Delsym)- medicine that has DM in it -Guafenesin (Mucinex/Robitussin) - cough drops - drink lots of water  Chest Congestion  -Guafenesin (Mucinex/Robitussin)  Red Itchy Eyes  - Naphcon-A  Upset Stomach  - Bland diet (nothing spicy, greasy, fried, and high acid foods like tomatoes, oranges, berries) -OKAY- cereal, bread, soup, crackers, rice -Eat smaller more frequent meals -reduce caffeine, no alcohol -Loperamide (Imodium-AD) if diarrhea -Prevacid for heart burn  General health when sick  -Hydration -wash your hands frequently -keep surfaces clean -change pillow cases and sheets often -Get fresh air but do not exercise strenuously -Vitamin D, double up on it - Vitamin C -Zinc

## 2017-02-09 NOTE — Progress Notes (Signed)
FOLLOW UP  Assessment and Plan:   Hypertension Well controlled with current medications  Monitor blood pressure at home; patient to call if consistently greater than 130/80 Continue DASH diet.   Reminder to go to the ER if any CP, SOB, nausea, dizziness, severe HA, changes vision/speech, left arm numbness and tingling and jaw pain.  Cholesterol Currently at goal; continue statin -  Continue low cholesterol diet and exercise.  Check lipid panel.   Prediabetes Recent A1Cs at goal; commended recent progress, can cut back on checking A1Cs if trend continues Continue diet and exercise, weight loss efforts Perform daily foot/skin check, notify office of any concerning changes.  Check A1C  Obesity with co morbidities Long discussion about weight loss, diet, and exercise Recommended diet heavy in fruits and veggies and low in animal meats, cheeses, and dairy products, appropriate calorie intake Discussed ideal weight for height  Patient will work on continuing with exercise goals Will follow up in 3 months  Vitamin D Def At goal at last visit; continue supplementation to maintain goal of 70-100 Defer Vit D level  GERD Well managed on current medications Discussed diet, avoiding triggers and other lifestyle changes   Continue diet and meds as discussed. Further disposition pending results of labs. Discussed med's effects and SE's.   Over 30 minutes of exam, counseling, chart review, and critical decision making was performed.   Future Appointments  Date Time Provider [place]  05/11/2017 10:30 AM [name], MD [facility] None  12/19/2017 10:00 AM [name], MD [facility] None    ----------------------------------------------------------------------------------------------------------------------  HPI 72 y.o. male  presents for 3 month follow up on hypertension, cholesterol, prediabetes, GERD, obesity and vitamin D deficiency.  He reports ongoing residual  dry cough from recent URI  he has a diagnosis of GERD which is currently managed by prilosec 20 mg as needed only he reports symptoms is currently well controlled, and denies breakthrough reflux, burning in chest, hoarseness or cough.    BMI is Body mass index is 30.85 kg/m., he has been working on diet and exercise. He got a fit bit for Christmas and has been aiming for 10000 steps daily.  Wt Readings from Last 3 Encounters:  02/10/17 215 lb (97.5 kg)  02/06/17 214 lb (97.1 kg)  11/07/16 220 lb 6.4 oz (100 kg)   His blood pressure has been controlled at home, today their BP is BP: 136/80  He does workout. He denies chest pain, shortness of breath, dizziness.   He is on cholesterol medication and denies myalgias. His cholesterol is at goal. The cholesterol last visit was:   Lab Results  Component Value Date   CHOL 154 11/07/2016   HDL 38 (L) 11/07/2016   LDLCALC 80 04/25/2016   TRIG 116 11/07/2016   CHOLHDL 4.1 11/07/2016    He has been working on diet and exercise for prediabetes (recent A1C at goal), and denies increased appetite, nausea, paresthesia of the feet, polydipsia, polyuria, visual disturbances and vomiting. Last A1C in the office was:  Lab Results  Component Value Date   HGBA1C 5.5 11/07/2016   Patient is on Vitamin D supplement and at goal at recent check:   Lab Results  Component Value Date   VD25OH 81 11/07/2016        Current Medications:  Current Outpatient Medications on File Prior to Visit  Medication Sig  . aspirin 81 MG tablet Take 81 mg by mouth at bedtime.   Marland Kitchen atenolol (TENORMIN) 100 MG tablet TAKE  1 TABLET A DAY FOR BLOOD PRESSURE  . azithromycin (ZITHROMAX) 250 MG tablet Take 2 tablets (500 mg) on  Day 1,  followed by 1 tablet (250 mg) once daily on Days 2 through 5.  . Cholecalciferol (HM VITAMIN D3) 2000 UNITS CAPS Take 2,000 Units by mouth at bedtime.   Marland Kitchen CINNAMON PO Take 1,000 mg by mouth daily.  . cyclobenzaprine (FLEXERIL) 10 MG tablet Take  1/2 to 1 tablet 2 to 3 x / day as needed for muscle spasms  . Flaxseed, Linseed, (FLAXSEED OIL PO) Take 1 capsule by mouth daily.  . Magnesium 250 MG TABS Take by mouth.  . Omega-3 Fatty Acids (FISH OIL PO) Take 1,200 mg by mouth at bedtime.   Marland Kitchen omeprazole (PRILOSEC) 20 MG capsule Take 1 capsule (20 mg total) by mouth daily.  . pravastatin (PRAVACHOL) 40 MG tablet TAKE 1 TABLET (40 MG TOTAL) BY MOUTH DAILY.  Marland Kitchen predniSONE (DELTASONE) 20 MG tablet 1 tab 3 x day for 3 days, then 1 tab 2 x day for 3 days, then 1 tab 1 x day for 5 days  . promethazine-dextromethorphan (PROMETHAZINE-DM) 6.25-15 MG/5ML syrup Take 5 mLs by mouth 4 (four) times daily as needed for cough.  . sildenafil (REVATIO) 20 MG tablet Take 2 tablets as needed (Patient taking differently: Take 20 mg by mouth as needed. Take 1 tablet as needed)  . azelastine (ASTELIN) 0.1 % nasal spray Place 2 sprays into both nostrils 2 (two) times daily. Use in each nostril as directed   No current facility-administered medications on file prior to visit.      Allergies:  Allergies  Allergen Reactions  . Ceftin [Cefuroxime Axetil] Hives  . Peanut-Containing Drug Products Other (See Comments)    Break out on nose      Medical History:  Past Medical History:  Diagnosis Date  . Chronic kidney disease   . Dyslipidemia   . ED (erectile dysfunction)   . GERD (gastroesophageal reflux disease)   . History of kidney stones   . Hyperlipidemia   . Hypertension   . Vitamin D deficiency    Family history- Reviewed and unchanged Social history- Reviewed and unchanged   Review of Systems:  Review of Systems  Constitutional: Negative for malaise/fatigue and weight loss.  HENT: Negative for congestion, hearing loss, sinus pain, sore throat and tinnitus.   Eyes: Negative for blurred vision and double vision.  Respiratory: Positive for cough (Dry cough ongoing after URI). Negative for sputum production, shortness of breath and wheezing.    Cardiovascular: Negative for chest pain, palpitations, orthopnea, claudication and leg swelling.  Gastrointestinal: Negative for abdominal pain, blood in stool, constipation, diarrhea, heartburn, melena, nausea and vomiting.  Genitourinary: Negative.   Musculoskeletal: Negative for joint pain and myalgias.  Skin: Negative for rash.  Neurological: Negative for dizziness, tingling, sensory change, weakness and headaches.  Endo/Heme/Allergies: Negative for polydipsia.  Psychiatric/Behavioral: Negative.   All other systems reviewed and are negative.     Physical Exam: BP 136/80   Pulse 63   Temp 98.2 F (36.8 C)   Ht 5\' 10"  (1.778 m)   Wt 215 lb (97.5 kg)   SpO2 96%   BMI 30.85 kg/m  Wt Readings from Last 3 Encounters:  02/10/17 215 lb (97.5 kg)  02/06/17 214 lb (97.1 kg)  11/07/16 220 lb 6.4 oz (100 kg)   General Appearance: Well nourished, in no apparent distress. Eyes: PERRLA, EOMs, conjunctiva no swelling or erythema Sinuses: No Frontal/maxillary tenderness  ENT/Mouth: Ext aud canals clear, TMs without erythema, bulging. No erythema, swelling, or exudate on post pharynx.  Tonsils not swollen or erythematous. Hearing normal.  Neck: Supple, thyroid normal.  Respiratory: Respiratory effort normal, BS equal bilaterally without rales, rhonchi, wheezing or stridor.  Cardio: RRR with no MRGs. Brisk peripheral pulses without edema.  Abdomen: Soft, + BS.  Non tender, no guarding, rebound, hernias, masses. Lymphatics: Non tender without lymphadenopathy.  Musculoskeletal: Full ROM, 5/5 strength, Normal gait Skin: Warm, dry without rashes, lesions, ecchymosis.  Neuro: Cranial nerves intact. No cerebellar symptoms.  Psych: Awake and oriented X 3, normal affect, Insight and Judgment appropriate.    Jeffrey Ribas, NP 10:54 AM [facility]

## 2017-02-10 ENCOUNTER — Ambulatory Visit: Payer: Medicare Other | Admitting: Adult Health

## 2017-02-10 ENCOUNTER — Encounter: Payer: Self-pay | Admitting: Adult Health

## 2017-02-10 VITALS — BP 136/80 | HR 63 | Temp 98.2°F | Ht 70.0 in | Wt 215.0 lb

## 2017-02-10 DIAGNOSIS — E782 Mixed hyperlipidemia: Secondary | ICD-10-CM

## 2017-02-10 DIAGNOSIS — K219 Gastro-esophageal reflux disease without esophagitis: Secondary | ICD-10-CM | POA: Diagnosis not present

## 2017-02-10 DIAGNOSIS — E559 Vitamin D deficiency, unspecified: Secondary | ICD-10-CM | POA: Diagnosis not present

## 2017-02-10 DIAGNOSIS — E669 Obesity, unspecified: Secondary | ICD-10-CM | POA: Diagnosis not present

## 2017-02-10 DIAGNOSIS — R7303 Prediabetes: Secondary | ICD-10-CM

## 2017-02-10 DIAGNOSIS — I10 Essential (primary) hypertension: Secondary | ICD-10-CM | POA: Diagnosis not present

## 2017-02-10 DIAGNOSIS — R059 Cough, unspecified: Secondary | ICD-10-CM

## 2017-02-10 DIAGNOSIS — R05 Cough: Secondary | ICD-10-CM | POA: Diagnosis not present

## 2017-02-10 DIAGNOSIS — Z79899 Other long term (current) drug therapy: Secondary | ICD-10-CM | POA: Diagnosis not present

## 2017-02-10 MED ORDER — BENZONATATE 100 MG PO CAPS: 200.0000 mg | ORAL_CAPSULE | Freq: Three times a day (TID) | ORAL | 0 refills | Status: DC | PRN

## 2017-02-10 NOTE — Patient Instructions (Signed)
    When it comes to diets, agreement about the perfect plan isn't easy to find, even among the experts. Experts at the [employer] developed an idea known as the Healthy Eating Plate. Just imagine a plate divided into logical, healthy portions.  The emphasis is on diet quality:  Load up on vegetables and fruits - one-half of your plate: Aim for color and variety, and remember that potatoes don't count.  Go for whole grains - one-quarter of your plate: Whole wheat, barley, wheat berries, quinoa, oats, brown rice, and foods made with them. If you want pasta, go with whole wheat pasta.  Protein power - one-quarter of your plate: Fish, chicken, beans, and nuts are all healthy, versatile protein sources. Limit red meat.  The diet, however, does go beyond the plate, offering a few other suggestions.  Use healthy plant oils, such as olive, canola, soy, corn, sunflower and peanut. Check the labels, and avoid partially hydrogenated oil, which have unhealthy trans fats.  If you're thirsty, drink water. Coffee and tea are good in moderation, but skip sugary drinks and limit milk and dairy products to one or two daily servings.  The type of carbohydrate in the diet is more important than the amount. Some sources of carbohydrates, such as vegetables, fruits, whole grains, and beans--are healthier than others.  Finally, stay active.  

## 2017-02-11 LAB — BASIC METABOLIC PANEL WITH GFR
BUN / CREAT RATIO: 25 (calc) — AB (ref 6–22)
BUN: 40 mg/dL — AB (ref 7–25)
CALCIUM: 9.4 mg/dL (ref 8.6–10.3)
CO2: 27 mmol/L (ref 20–32)
CREATININE: 1.62 mg/dL — AB (ref 0.70–1.18)
Chloride: 100 mmol/L (ref 98–110)
GFR, Est African American: 49 mL/min/{1.73_m2} — ABNORMAL LOW (ref >=60)
GFR, Est Non African American: 42 mL/min/{1.73_m2} — ABNORMAL LOW (ref >=60)
GLUCOSE: 122 mg/dL — AB (ref 65–99)
Potassium: 4.6 mmol/L (ref 3.5–5.3)
Sodium: 140 mmol/L (ref 135–146)

## 2017-02-11 LAB — CBC WITH DIFFERENTIAL/PLATELET
BASOS PCT: 0.1 %
Basophils Absolute: 15 cells/uL (ref 0–200)
EOS ABS: 0 {cells}/uL — AB (ref 15–500)
Eosinophils Relative: 0 %
HCT: 50.2 % — ABNORMAL HIGH (ref 38.5–50.0)
HEMOGLOBIN: 16.9 g/dL (ref 13.2–17.1)
Lymphs Abs: 1374 cells/uL (ref 850–3900)
MCH: 29.6 pg (ref 27.0–33.0)
MCHC: 33.7 g/dL (ref 32.0–36.0)
MCV: 87.9 fL (ref 80.0–100.0)
MONOS PCT: 5 %
MPV: 9.2 fL (ref 7.5–12.5)
NEUTROS ABS: 12956 {cells}/uL — AB (ref 1500–7800)
Neutrophils Relative %: 85.8 %
Platelets: 335 10*3/uL (ref 140–400)
RBC: 5.71 10*6/uL (ref 4.20–5.80)
RDW: 12.9 % (ref 11.0–15.0)
Total Lymphocyte: 9.1 %
WBC: 15.1 10*3/uL — ABNORMAL HIGH (ref 3.8–10.8)
WBCMIX: 755 {cells}/uL (ref 200–950)

## 2017-02-11 LAB — HEPATIC FUNCTION PANEL
AG Ratio: 1.5 (calc) (ref 1.0–2.5)
ALBUMIN MSPROF: 4.3 g/dL (ref 3.6–5.1)
ALKALINE PHOSPHATASE (APISO): 59 U/L (ref 40–115)
ALT: 28 U/L (ref 9–46)
AST: 25 U/L (ref 10–35)
BILIRUBIN DIRECT: 0.1 mg/dL (ref 0.0–0.2)
BILIRUBIN TOTAL: 0.5 mg/dL (ref 0.2–1.2)
Globulin: 2.8 g/dL (calc) (ref 1.9–3.7)
Indirect Bilirubin: 0.4 mg/dL (calc) (ref 0.2–1.2)
TOTAL PROTEIN: 7.1 g/dL (ref 6.1–8.1)

## 2017-02-11 LAB — HEMOGLOBIN A1C
EAG (MMOL/L): 6.3 (calc)
Hgb A1c MFr Bld: 5.6 % of total Hgb (ref <5.7)
MEAN PLASMA GLUCOSE: 114 (calc)

## 2017-02-11 LAB — LIPID PANEL
CHOLESTEROL: 160 mg/dL (ref <200)
HDL: 37 mg/dL — ABNORMAL LOW (ref >40)
LDL Cholesterol (Calc): 95 mg/dL (calc)
Non-HDL Cholesterol (Calc): 123 mg/dL (calc) (ref <130)
Total CHOL/HDL Ratio: 4.3 (calc) (ref <5.0)
Triglycerides: 181 mg/dL — ABNORMAL HIGH (ref <150)

## 2017-02-11 LAB — TSH: TSH: 0.38 m[IU]/L — AB (ref 0.40–4.50)

## 2017-02-13 ENCOUNTER — Encounter: Payer: Self-pay | Admitting: Adult Health

## 2017-02-13 ENCOUNTER — Other Ambulatory Visit: Payer: Self-pay | Admitting: Adult Health

## 2017-02-13 DIAGNOSIS — J40 Bronchitis, not specified as acute or chronic: Secondary | ICD-10-CM

## 2017-02-13 MED ORDER — PREDNISONE 20 MG PO TABS: ORAL_TABLET | ORAL | 0 refills | Status: DC

## 2017-02-13 MED ORDER — DOXYCYCLINE HYCLATE 100 MG PO TABS: 100.0000 mg | ORAL_TABLET | Freq: Two times a day (BID) | ORAL | 0 refills | Status: AC

## 2017-03-04 ENCOUNTER — Other Ambulatory Visit: Payer: Self-pay | Admitting: Physician Assistant

## 2017-03-04 DIAGNOSIS — I10 Essential (primary) hypertension: Secondary | ICD-10-CM

## 2017-03-04 DIAGNOSIS — E782 Mixed hyperlipidemia: Secondary | ICD-10-CM

## 2017-03-29 DIAGNOSIS — G5602 Carpal tunnel syndrome, left upper limb: Secondary | ICD-10-CM | POA: Diagnosis not present

## 2017-04-04 DIAGNOSIS — R200 Anesthesia of skin: Secondary | ICD-10-CM | POA: Diagnosis not present

## 2017-04-04 DIAGNOSIS — G5602 Carpal tunnel syndrome, left upper limb: Secondary | ICD-10-CM | POA: Diagnosis not present

## 2017-04-10 DIAGNOSIS — G5622 Lesion of ulnar nerve, left upper limb: Secondary | ICD-10-CM | POA: Diagnosis not present

## 2017-05-11 ENCOUNTER — Ambulatory Visit: Payer: Medicare Other | Admitting: Internal Medicine

## 2017-05-11 VITALS — BP 136/84 | HR 64 | Temp 97.6°F | Resp 16 | Ht 70.0 in | Wt 219.0 lb

## 2017-05-11 DIAGNOSIS — K219 Gastro-esophageal reflux disease without esophagitis: Secondary | ICD-10-CM

## 2017-05-11 DIAGNOSIS — Z79899 Other long term (current) drug therapy: Secondary | ICD-10-CM | POA: Diagnosis not present

## 2017-05-11 DIAGNOSIS — R7309 Other abnormal glucose: Secondary | ICD-10-CM | POA: Diagnosis not present

## 2017-05-11 DIAGNOSIS — E782 Mixed hyperlipidemia: Secondary | ICD-10-CM

## 2017-05-11 DIAGNOSIS — I10 Essential (primary) hypertension: Secondary | ICD-10-CM | POA: Diagnosis not present

## 2017-05-11 DIAGNOSIS — E559 Vitamin D deficiency, unspecified: Secondary | ICD-10-CM | POA: Diagnosis not present

## 2017-05-11 NOTE — Progress Notes (Signed)
This very nice 72 y.o. MWM presents for 6 month follow up with HTN, HLD, Pre-Diabetes and Vitamin D Deficiency. Patient has GERD controlled w/diet & meds.     Patient is treated for HTN & BP has been controlled at home. Today's BP is at goal - 136/84. Patient has had no complaints of any cardiac type chest pain, palpitations, dyspnea / orthopnea / PND, dizziness, claudication, or dependent edema.     Hyperlipidemia is controlled with diet & meds. Patient denies myalgias or other med SE's. Last Lipids were at goal albeit elevated Trig's: Lab Results  Component Value Date   CHOL 135 05/11/2017   HDL 35 (L) 05/11/2017   LDLCALC 71 05/11/2017   TRIG 193 (H) 05/11/2017   CHOLHDL 3.9 05/11/2017      Also, the patient has history of PreDiabetes and has had no symptoms of reactive hypoglycemia, diabetic polys, paresthesias or visual blurring.  Last A1c was  Lab Results  Component Value Date   HGBA1C 5.4 05/11/2017      Further, the patient also has history of Vitamin D Deficiency and supplements vitamin D without any suspected side-effects. Last vitamin D was at goal: Lab Results  Component Value Date   VD25OH 69 05/11/2017   Current Outpatient Medications on File Prior to Visit  Medication Sig  . aspirin 81 MG tablet Take 81 mg by mouth at bedtime.   Marland Kitchen atenolol (TENORMIN) 100 MG tablet TAKE 1 TABLET A DAY FOR BLOOD PRESSURE  . Cholecalciferol (HM VITAMIN D3) 2000 UNITS CAPS Take 2,000 Units by mouth at bedtime.   Marland Kitchen CINNAMON PO Take 1,000 mg by mouth daily.  . Flaxseed, Linseed, (FLAXSEED OIL PO) Take 1 capsule by mouth daily.  . Magnesium 250 MG TABS Take by mouth.  . Omega-3 Fatty Acids (FISH OIL PO) Take 1,200 mg by mouth at bedtime.   Marland Kitchen omeprazole (PRILOSEC) 20 MG capsule Take 1 capsule (20 mg total) by mouth daily.  . pravastatin (PRAVACHOL) 40 MG tablet TAKE 1 TABLET BY MOUTH EVERY DAY  . sildenafil (REVATIO) 20 MG tablet Take 2 tablets as needed (Patient taking differently:  Take 20 mg by mouth as needed. Take 1 tablet as needed)   No current facility-administered medications on file prior to visit.    Allergies  Allergen Reactions  . Ceftin [Cefuroxime Axetil] Hives  . Peanut-Containing Drug Products Other (See Comments)    Break out on nose    PMHx:   Past Medical History:  Diagnosis Date  . Chronic kidney disease   . Dyslipidemia   . ED (erectile dysfunction)   . GERD (gastroesophageal reflux disease)   . History of kidney stones   . Hyperlipidemia   . Hypertension   . Vitamin D deficiency    Immunization History  Administered Date(s) Administered  . Influenza Whole 10/10/2012  . Influenza, High Dose Seasonal PF 11/07/2016  . Influenza-Unspecified 11/04/2014  . Pneumococcal Conjugate-13 02/05/2014  . Pneumococcal Polysaccharide-23 07/10/2012  . Td 03/07/2006   Past Surgical History:  Procedure Laterality Date  . ANTERIOR CERVICAL DECOMP/DISCECTOMY FUSION N/A 04/30/2014   Procedure: ANTERIOR CERVICAL DECOMPRESSION/DISCECTOMY FUSION 1 LEVEL;  Surgeon: Phylliss Bob, MD;  Location: [place];  Service: Orthopedics;  Laterality: N/A;  Anterior cervical decompression fusion, cervical 7-thoracic 1 with instrumentation and allograft  . HAMMER TOE SURGERY  2012   left foot, 3rd toe  . LAPAROSCOPIC CHOLECYSTECTOMY  10/2010   FHx:    Reviewed / unchanged  SHx:  Reviewed / unchanged   Systems Review:  Constitutional: Denies fever, chills, wt changes, headaches, insomnia, fatigue, night sweats, change in appetite. Eyes: Denies redness, blurred vision, diplopia, discharge, itchy, watery eyes.  ENT: Denies discharge, congestion, post nasal drip, epistaxis, sore throat, earache, hearing loss, dental pain, tinnitus, vertigo, sinus pain, snoring.  CV: Denies chest pain, palpitations, irregular heartbeat, syncope, dyspnea, diaphoresis, orthopnea, PND, claudication or edema. Respiratory: denies cough, dyspnea, DOE, pleurisy, hoarseness, laryngitis,  wheezing.  Gastrointestinal: Denies dysphagia, odynophagia, heartburn, reflux, water brash, abdominal pain or cramps, nausea, vomiting, bloating, diarrhea, constipation, hematemesis, melena, hematochezia  or hemorrhoids. Genitourinary: Denies dysuria, frequency, urgency, nocturia, hesitancy, discharge, hematuria or flank pain. Musculoskeletal: Denies arthralgias, myalgias, stiffness, jt. swelling, pain, limping or strain/sprain.  Skin: Denies pruritus, rash, hives, warts, acne, eczema or change in skin lesion(s). Neuro: No weakness, tremor, incoordination, spasms, paresthesia or pain. Psychiatric: Denies confusion, memory loss or sensory loss. Endo: Denies change in weight, skin or hair change.  Heme/Lymph: No excessive bleeding, bruising or enlarged lymph nodes.  Physical Exam  BP 136/84   Pulse 64   Temp 97.6 F (36.4 C)   Resp 16   Ht 5\' 10"  (1.778 m)   Wt 219 lb (99.3 kg)   BMI 31.42 kg/m   Appears  well nourished, well groomed  and in no distress.  Eyes: PERRLA, EOMs, conjunctiva no swelling or erythema. Sinuses: No frontal/maxillary tenderness ENT/Mouth: EAC's clear, TM's nl w/o erythema, bulging. Nares clear w/o erythema, swelling, exudates. Oropharynx clear without erythema or exudates. Oral hygiene is good. Tongue normal, non obstructing. Hearing intact.  Neck: Supple. Thyroid not palpable. Car 2+/2+ without bruits, nodes or JVD. Chest: Respirations nl with BS clear & equal w/o rales, rhonchi, wheezing or stridor.  Cor: Heart sounds normal w/ regular rate and rhythm without sig. murmurs, gallops, clicks or rubs. Peripheral pulses normal and equal  without edema.  Abdomen: Soft & bowel sounds normal. Non-tender w/o guarding, rebound, hernias, masses or organomegaly.  Lymphatics: Unremarkable.  Musculoskeletal: Full ROM all peripheral extremities, joint stability, 5/5 strength and normal gait.  Skin: Warm, dry without exposed rashes, lesions or ecchymosis apparent.  Neuro:  Cranial nerves intact, reflexes equal bilaterally. Sensory-motor testing grossly intact. Tendon reflexes grossly intact.  Pysch: Alert & oriented x 3.  Insight and judgement nl & appropriate. No ideations.  Assessment and Plan:  1. Essential hypertension  - Continue medication, monitor blood pressure at home.  - Continue DASH diet.  Reminder to go to the ER if any CP,  SOB, nausea, dizziness, severe HA, changes vision/speech.  - CBC with Differential/Platelet - COMPLETE METABOLIC PANEL WITH GFR - Magnesium - TSH  2. Hyperlipidemia, mixed  - Continue diet/meds, exercise,& lifestyle modifications.  - Continue monitor periodic cholesterol/liver & renal functions   - Lipid panel - TSH  3. Abnormal glucose  - Continue diet, exercise, lifestyle modifications.  - Monitor appropriate labs.  - Hemoglobin A1c - Insulin, random  4. Vitamin D deficiency  - Continue supplementation.   - VITAMIN D 25 Hydroxyl  5. Gastroesophageal reflux disease  - CBC with Differential/Platelet  6. Medication management  - CBC with Differential/Platelet - COMPLETE METABOLIC PANEL WITH GFR - Magnesium - Lipid panel - TSH - Hemoglobin A1c - Insulin, random - VITAMIN D 25 Hydroxyl          Discussed  regular exercise, BP monitoring, weight control to achieve/maintain BMI less than 25 and discussed med and SE's. Recommended labs to assess and monitor clinical status  with further disposition pending results of labs. Over 30 minutes of exam, counseling, chart review was performed.

## 2017-05-11 NOTE — Patient Instructions (Signed)

## 2017-05-12 LAB — CBC WITH DIFFERENTIAL/PLATELET
Basophils Absolute: 41 cells/uL (ref 0–200)
Basophils Relative: 0.7 %
EOS ABS: 191 {cells}/uL (ref 15–500)
EOS PCT: 3.3 %
HCT: 45.7 % (ref 38.5–50.0)
Hemoglobin: 15.9 g/dL (ref 13.2–17.1)
Lymphs Abs: 1630 cells/uL (ref 850–3900)
MCH: 30.6 pg (ref 27.0–33.0)
MCHC: 34.8 g/dL (ref 32.0–36.0)
MCV: 88.1 fL (ref 80.0–100.0)
MONOS PCT: 7.2 %
MPV: 9.2 fL (ref 7.5–12.5)
NEUTROS PCT: 60.7 %
Neutro Abs: 3521 cells/uL (ref 1500–7800)
Platelets: 273 10*3/uL (ref 140–400)
RBC: 5.19 10*6/uL (ref 4.20–5.80)
RDW: 13.3 % (ref 11.0–15.0)
Total Lymphocyte: 28.1 %
WBC mixed population: 418 cells/uL (ref 200–950)
WBC: 5.8 10*3/uL (ref 3.8–10.8)

## 2017-05-12 LAB — LIPID PANEL
CHOL/HDL RATIO: 3.9 (calc) (ref <5.0)
CHOLESTEROL: 135 mg/dL (ref <200)
HDL: 35 mg/dL — AB (ref >40)
LDL CHOLESTEROL (CALC): 71 mg/dL
NON-HDL CHOLESTEROL (CALC): 100 mg/dL (ref <130)
TRIGLYCERIDES: 193 mg/dL — AB (ref <150)

## 2017-05-12 LAB — COMPLETE METABOLIC PANEL WITH GFR
AG Ratio: 1.8 (calc) (ref 1.0–2.5)
ALKALINE PHOSPHATASE (APISO): 55 U/L (ref 40–115)
ALT: 20 U/L (ref 9–46)
AST: 18 U/L (ref 10–35)
Albumin: 4.2 g/dL (ref 3.6–5.1)
BILIRUBIN TOTAL: 0.6 mg/dL (ref 0.2–1.2)
BUN / CREAT RATIO: 16 (calc) (ref 6–22)
BUN: 23 mg/dL (ref 7–25)
CHLORIDE: 105 mmol/L (ref 98–110)
CO2: 27 mmol/L (ref 20–32)
Calcium: 9.5 mg/dL (ref 8.6–10.3)
Creat: 1.4 mg/dL — ABNORMAL HIGH (ref 0.70–1.18)
GFR, Est African American: 59 mL/min/{1.73_m2} — ABNORMAL LOW (ref >=60)
GFR, Est Non African American: 51 mL/min/{1.73_m2} — ABNORMAL LOW (ref >=60)
GLOBULIN: 2.4 g/dL (ref 1.9–3.7)
GLUCOSE: 94 mg/dL (ref 65–99)
Potassium: 4.7 mmol/L (ref 3.5–5.3)
SODIUM: 140 mmol/L (ref 135–146)
Total Protein: 6.6 g/dL (ref 6.1–8.1)

## 2017-05-12 LAB — HEMOGLOBIN A1C
EAG (MMOL/L): 6 (calc)
Hgb A1c MFr Bld: 5.4 % of total Hgb (ref <5.7)
MEAN PLASMA GLUCOSE: 108 (calc)

## 2017-05-12 LAB — TSH: TSH: 1.49 mIU/L (ref 0.40–4.50)

## 2017-05-12 LAB — VITAMIN D 25 HYDROXY (VIT D DEFICIENCY, FRACTURES): VIT D 25 HYDROXY: 69 ng/mL (ref 30–100)

## 2017-05-12 LAB — INSULIN, RANDOM: INSULIN: 126.2 u[IU]/mL — AB (ref 2.0–19.6)

## 2017-05-12 LAB — MAGNESIUM: MAGNESIUM: 1.9 mg/dL (ref 1.5–2.5)

## 2017-05-12 IMAGING — DX DG CHEST 2V
2 series · 2 of 2 positions shown · non-contrast
Comparison: 08/22/2009

CLINICAL DATA: Chest pain for several weeks, worse tonight.

EXAM:
CHEST  2 VIEW

[chest pa]
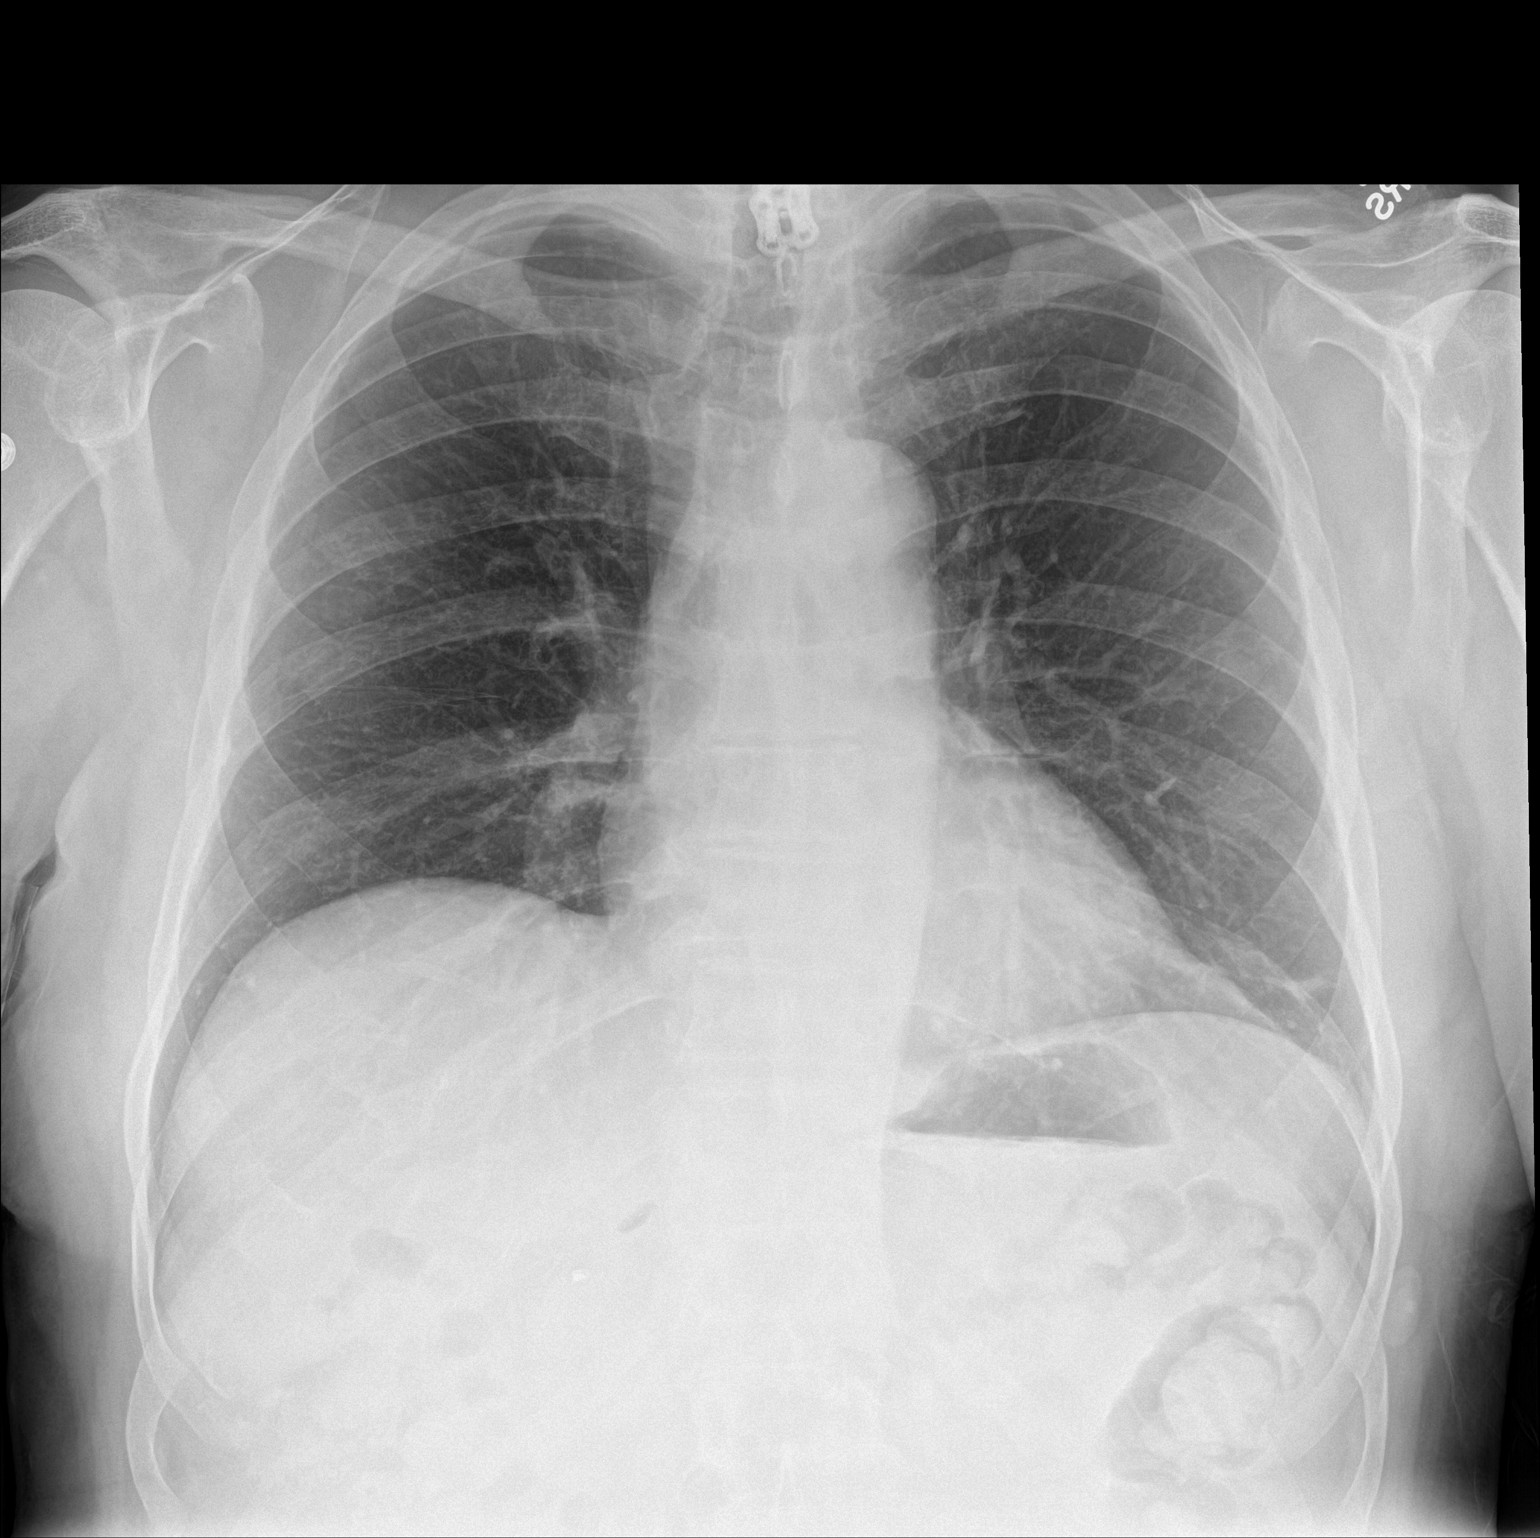

[chest lat]
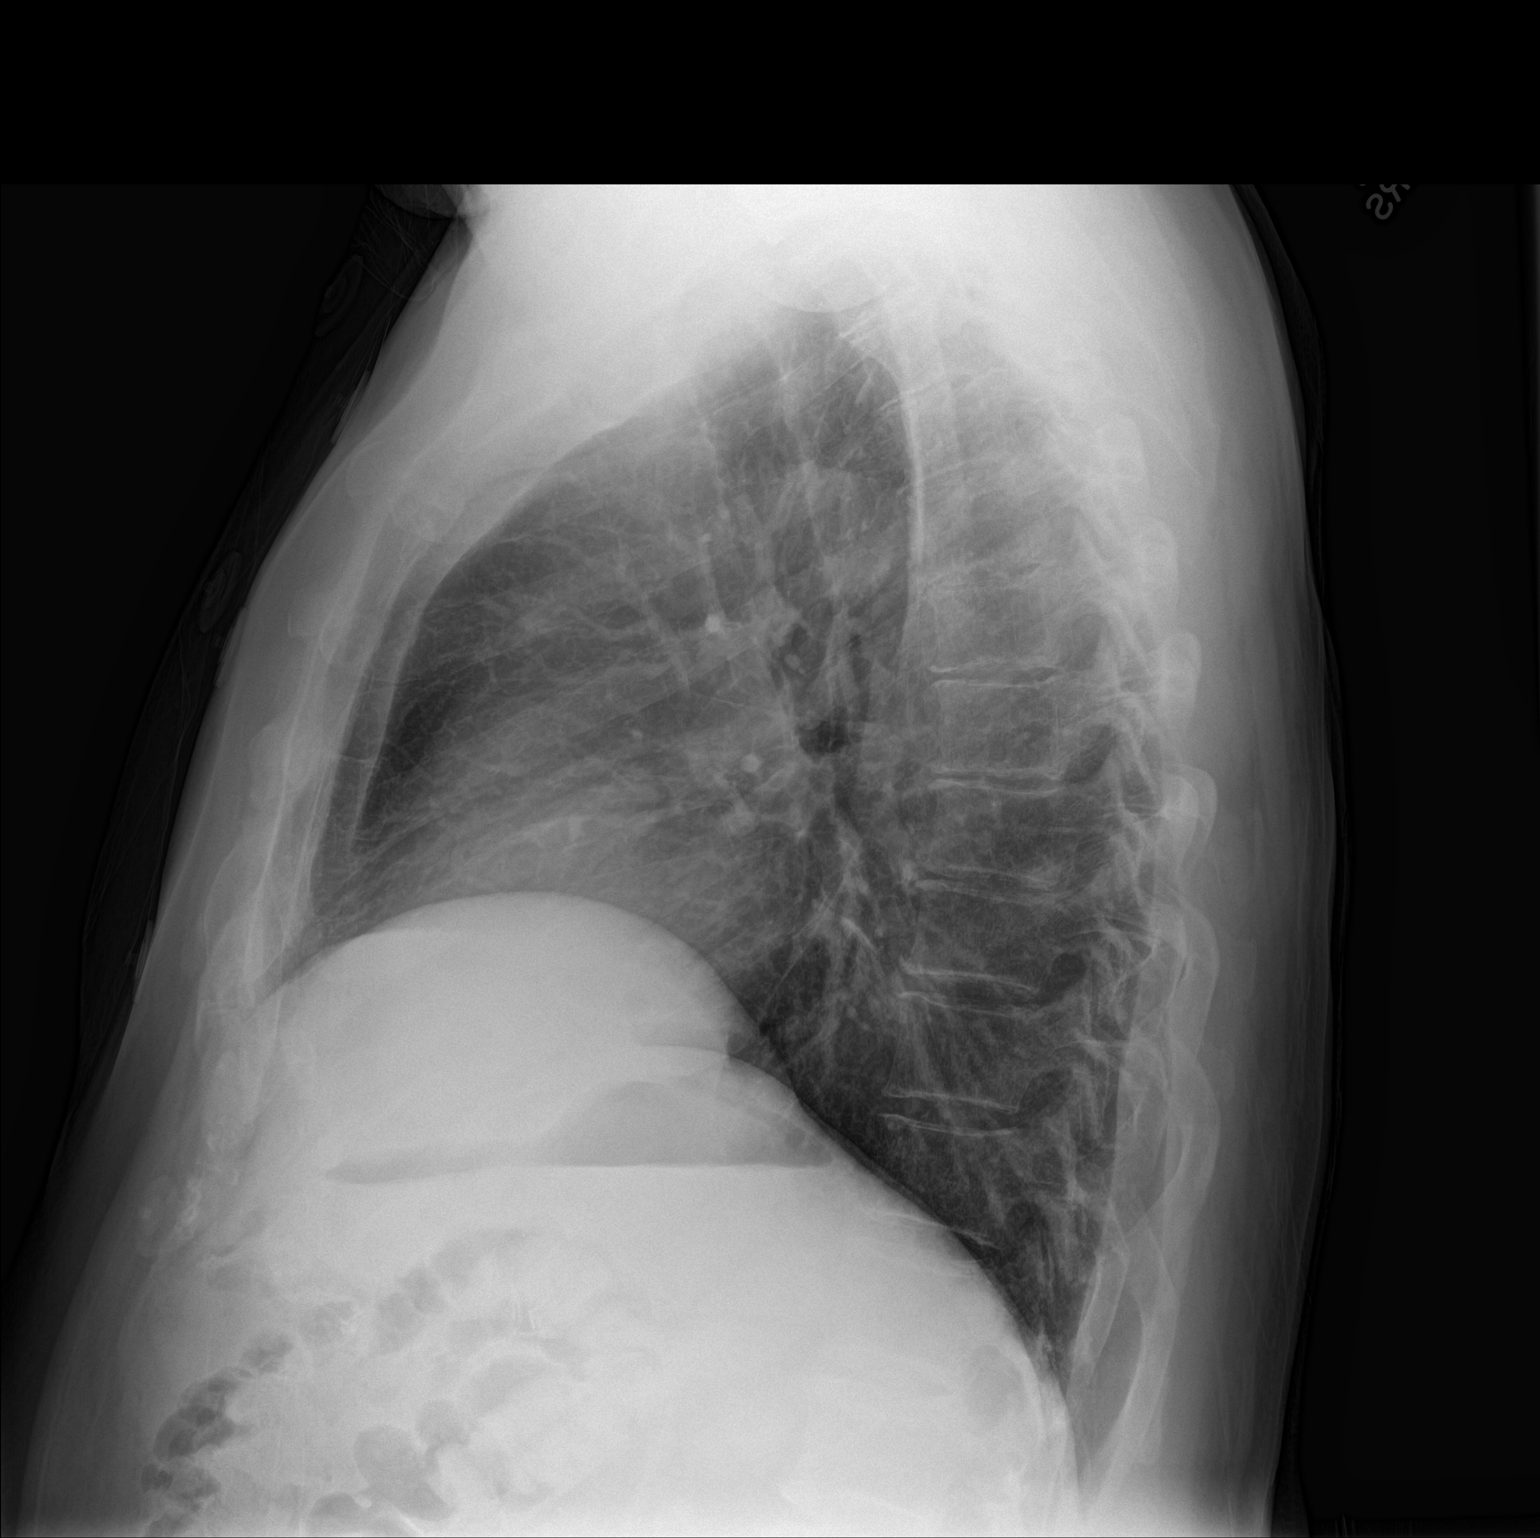

[2 of 2 positions shown; findings below may reference images not displayed]

FINDINGS: Stable scarring in the left lateral base. The lungs are otherwise
clear. The pulmonary vasculature is normal. There is no pleural
effusion. Hilar, mediastinal and cardiac contours are unremarkable
and unchanged.
IMPRESSION: No active cardiopulmonary disease.

## 2017-05-13 ENCOUNTER — Encounter: Payer: Self-pay | Admitting: Internal Medicine

## 2017-05-14 ENCOUNTER — Encounter: Payer: Self-pay | Admitting: Internal Medicine

## 2017-05-15 DIAGNOSIS — G5622 Lesion of ulnar nerve, left upper limb: Secondary | ICD-10-CM | POA: Diagnosis not present

## 2017-06-12 DIAGNOSIS — M79672 Pain in left foot: Secondary | ICD-10-CM | POA: Diagnosis not present

## 2017-06-20 DIAGNOSIS — M79672 Pain in left foot: Secondary | ICD-10-CM | POA: Diagnosis not present

## 2017-07-10 DIAGNOSIS — Z967 Presence of other bone and tendon implants: Secondary | ICD-10-CM | POA: Diagnosis not present

## 2017-07-10 DIAGNOSIS — T84328A Displacement of other bone devices, implants and grafts, initial encounter: Secondary | ICD-10-CM | POA: Diagnosis not present

## 2017-07-10 DIAGNOSIS — M79642 Pain in left hand: Secondary | ICD-10-CM | POA: Diagnosis not present

## 2017-07-10 DIAGNOSIS — Z472 Encounter for removal of internal fixation device: Secondary | ICD-10-CM | POA: Diagnosis not present

## 2017-07-20 DIAGNOSIS — Z9889 Other specified postprocedural states: Secondary | ICD-10-CM | POA: Diagnosis not present

## 2017-09-12 DIAGNOSIS — N183 Chronic kidney disease, stage 3 unspecified: Secondary | ICD-10-CM | POA: Insufficient documentation

## 2017-09-12 NOTE — Patient Instructions (Addendum)
Try cutting atenolol medication in 1/2 and see if this helps your symptoms    Jeffrey Carey , Thank you for taking time to come for your Medicare Wellness Visit. I appreciate your ongoing commitment to your health goals. Please review the following plan we discussed and let me know if I can assist you in the future.   These are the goals we discussed: Goals    . Blood Pressure < 130/80    . Weight (lb) < 210 lb (95.3 kg)       This is a list of the screening recommended for you and due dates:  Health Maintenance  Topic Date Due  . Tetanus Vaccine  06/14/2016  . Flu Shot  09/26/2017*  .  Hepatitis C: One time screening is recommended by Center for Disease Control  (CDC) for  adults born from [birth year] through 1964.   01/03/2018*  . Colon Cancer Screening  04/30/2019  . Pneumonia vaccines  Completed  *Topic was postponed. The date shown is not the original due date.     Drink 1/2 your body weight in fluid ounces of water daily; drink a tall glass of water 30 min before meals  Don't eat until you're stuffed- listen to your stomach and eat until you are 80% full   Try eating off of a salad plate; wait 10 min after finishing before going back for seconds  Start by eating the vegetables on your plate; aim for 50% of your meals to be fruits or vegetables  Then eat your protein - lean meats (grass fed if possible), fish, beans, nuts in moderation  Eat your carbs/starch last ONLY if you still are hungry. If you can, stop before finishing it all  Avoid sugar and flour - the closer it looks to it's original form in nature, typically the better it is for you  Splurge in moderation - "assign" days when you get to splurge and have the "bad stuff" - I like to follow a 80% - 20% plan- "good" choices 80 % of the time, "bad" choices in moderation 20% of the time  Simple equation is: Calories out > calories in = weight loss - even if you eat the bad stuff, if you limit portions, you will still lose  weight   Know what a healthy weight is for you (roughly BMI <25) and aim to maintain this  Aim for 7+ servings of fruits and vegetables daily  65-80+ fluid ounces of water or unsweet tea for healthy kidneys  Limit to max 1 drink of alcohol per day; avoid smoking/tobacco  Limit animal fats in diet for cholesterol and heart health - choose grass fed whenever available  Avoid highly processed foods, and foods high in saturated/trans fats  Aim for low stress - take time to unwind and care for your mental health  Aim for 150 min of moderate intensity exercise weekly for heart health, and weights twice weekly for bone health  Aim for 7-9 hours of sleep daily

## 2017-09-12 NOTE — Progress Notes (Signed)
MEDICARE ANNUAL WELLNESS VISIT AND FOLLOW UP Assessment:    Encounter for Medicare annual wellness exam  Essential hypertension Having some fatigue with atenolol - will have him try cutting in 1/2 to monitor if this improves symptoms, monitor BP, call back in a few weeks with results Monitor blood pressure at home; call if consistently over 130/80 Continue DASH diet.   Reminder to go to the ER if any CP, SOB, nausea, dizziness, severe HA, changes vision/speech, left arm numbness and tingling and jaw pain.  Gastroesophageal reflux disease, esophagitis presence not specified Well managed on current medications Discussed diet, avoiding triggers and other lifestyle changes  Radiculopathy, unspecified spinal region Currently well controlled  Vitamin D deficiency At goal at recent check; continue to recommend supplementation for goal of 70-100 Defer vitamin D level  Other abnormal glucose Recent A1Cs at goal Discussed diet/exercise, weight management  Defer A1C; check CMP  Obesity (BMI 30.0-34.9) Long discussion about weight loss, diet, and exercise Recommended diet heavy in fruits and veggies and low in animal meats, cheeses, and dairy products, appropriate calorie intake Patient will work on cutting sugar, watching portions, increase walking Discussed appropriate weight for height and initial goal (210lb) Follow up at next visit  Medication management CBC, CMP/GFR, magnesium  Hyperlipidemia Continue medications: pravastatin 40 mg daily Continue low cholesterol diet and exercise.  Check lipid panel.   History of colonic polyps UTD on colonoscopies, repeat 2021  CKD (chronic kidney disease) stage 3, GFR 30-59 ml/min (HCC) Increase fluids, avoid NSAIDS, monitor sugars, will monitor   Over 30 minutes of exam, counseling, chart review, and critical decision making was performed  Future Appointments  Date Time Provider [place]  12/19/2017 10:00 AM [name], MD [facility] None     Plan:   During the course of the visit the patient was educated and counseled about appropriate screening and preventive services including:    Pneumococcal vaccine   Influenza vaccine  Prevnar 13  Td vaccine  Screening electrocardiogram  Colorectal cancer screening  Diabetes screening  Glaucoma screening  Nutrition counseling    Subjective:  Jeffrey Carey is a 72 y.o. male who presents for Medicare Annual Wellness Visit and 3 month follow up for HTN, hyperlipidemia, glucose management, and vitamin D Def.   BMI is Body mass index is 31.28 kg/m., he has been working on diet and exercise, cutting portions, eating less sugar but needs to cut further, will start walking.  Wt Readings from Last 3 Encounters:  09/14/17 218 lb (98.9 kg)  05/11/17 219 lb (99.3 kg)  02/10/17 215 lb (97.5 kg)   His blood pressure has been controlled at home, today their BP is BP: 126/78 He does workout. He denies chest pain, shortness of breath, dizziness.   He is on cholesterol medication (pravastatin 40 mg daily) and denies myalgias. His cholesterol is at goal. The cholesterol last visit was:   Lab Results  Component Value Date   CHOL 135 05/11/2017   HDL 35 (L) 05/11/2017   LDLCALC 71 05/11/2017   TRIG 193 (H) 05/11/2017   CHOLHDL 3.9 05/11/2017   He has been working on diet and exercise for glucose management, and denies increased appetite, nausea, paresthesia of the feet, polydipsia, polyuria, visual disturbances, vomiting and weight loss. Last A1C in the office was:  Lab Results  Component Value Date   HGBA1C 5.4 05/11/2017   Last GFR Lab Results  Component Value Date   GFRNONAA 51 (L) 05/11/2017    Patient  is on Vitamin D supplement.   Lab Results  Component Value Date   VD25OH 69 05/11/2017      Medication Review:   Current Outpatient Medications (Cardiovascular):  .  atenolol (TENORMIN) 100 MG tablet, TAKE 1 TABLET A DAY FOR BLOOD  PRESSURE .  pravastatin (PRAVACHOL) 40 MG tablet, TAKE 1 TABLET BY MOUTH EVERY DAY .  sildenafil (REVATIO) 20 MG tablet, Take 2 tablets as needed (Patient taking differently: Take 20 mg by mouth as needed. Take 1 tablet as needed)   Current Outpatient Medications (Analgesics):  .  aspirin 81 MG tablet, Take 81 mg by mouth at bedtime.    Current Outpatient Medications (Other):  Marland Kitchen  Cholecalciferol (HM VITAMIN D3) 2000 UNITS CAPS, Take 2,000 Units by mouth at bedtime.  Marland Kitchen  CINNAMON PO, Take 1,000 mg by mouth daily. .  Flaxseed, Linseed, (FLAXSEED OIL PO), Take 1 capsule by mouth daily. .  Magnesium 250 MG TABS, Take by mouth. .  Omega-3 Fatty Acids (FISH OIL PO), Take 1,200 mg by mouth at bedtime.  Marland Kitchen  omeprazole (PRILOSEC) 20 MG capsule, Take 1 capsule (20 mg total) by mouth daily.  Allergies: Allergies  Allergen Reactions  . Ceftin [Cefuroxime Axetil] Hives  . Peanut-Containing Drug Products Other (See Comments)    Break out on nose     Current Problems (verified) has History of colonic polyps; Essential hypertension; Hyperlipidemia; Other abnormal glucose; Vitamin D deficiency; Medication management; Radiculopathy; GERD; Obesity (BMI 30.0-34.9); and CKD (chronic kidney disease) stage 3, GFR 30-59 ml/min (HCC) on their problem list.  Screening Tests Immunization History  Administered Date(s) Administered  . Influenza Whole 10/10/2012  . Influenza, High Dose Seasonal PF 11/07/2016  . Influenza-Unspecified 11/04/2014  . Pneumococcal Conjugate-13 02/05/2014  . Pneumococcal Polysaccharide-23 07/10/2012  . Td 03/07/2006   Preventative care: Last colonoscopy: 04/2016 - 3 year f/u  Prior vaccinations: TD : 2008, would like to wait  Influenza: HD 2018 Pneumococcal: 07/2012 Prevnar/13: 02/05/2014 Shingles/Zostavax: undecided- deferred due to 95$ co-pay  Names of Other Physician/Practitioners you currently use: 1. [facility] here for primary  care 2. Dr. Katy Fitch, eye doctor, last visit 2019 3. Dr. ?, dentist, last visit 2019, q39m  Patient Care Team: [name], MD as PCP - General (Internal Medicine) Kathie Rhodes, MD as Consulting Physician (Urology) Jolyne Loa, DDS (Dentistry) [facility])  Surgical: He  has a past surgical history that includes Laparoscopic cholecystectomy (10/2010); Hammer toe surgery (2012); and Anterior cervical decomp/discectomy fusion (N/A, 04/30/2014). Family His family history includes Alzheimer's disease in his father; Colon cancer (age of onset: 52) in his father and mother; Diabetes in his father; Ovarian cancer in his mother; Prostate cancer in his father. Social history  He reports that he has never smoked. He has never used smokeless tobacco. He reports that he does not drink alcohol or use drugs.  MEDICARE WELLNESS OBJECTIVES: Physical activity:   Cardiac risk factors:   Depression/mood screen:   Depression screen [facility] 2/9 09/14/2017  Decreased Interest 0  Down, Depressed, Hopeless 0  PHQ - 2 Score 0    ADLs:  In your present state of health, do you have any difficulty performing the following activities: 05/13/2017 11/07/2016  Hearing? N N  Vision? N N  Difficulty concentrating or making decisions? N N  Walking or climbing stairs? N N  Dressing or bathing? N N  Doing errands, shopping? N N  Some recent data might be hidden  Cognitive Testing  Alert? Yes  Normal Appearance?Yes  Oriented to person? Yes  Place? Yes   Time? Yes  Recall of three objects?  Yes  Can perform simple calculations? Yes  Displays appropriate judgment?Yes  Can read the correct time from a watch face?Yes  EOL planning: Does Patient Have a Medical Advance Directive?: Yes Type of Advance Directive: Healthcare Power of Attorney, Living will Does patient want to make changes to medical advance directive?: No - Patient declined Copy of [place] in Chart?: No - copy  requested Would patient like information on creating a medical advance directive?: No - Patient declined   Objective:   Today's Vitals   09/14/17 1019  BP: 126/78  Pulse: (!) 54  Temp: (!) 96.8 F (36 C)  SpO2: 95%  Weight: 218 lb (98.9 kg)  Height: 5\' 10"  (1.778 m)   Body mass index is 31.28 kg/m.  General appearance: alert, no distress, WD/WN, male HEENT: normocephalic, sclerae anicteric, TMs pearly, nares patent, no discharge or erythema, pharynx normal Oral cavity: MMM, no lesions Neck: supple, no lymphadenopathy, no thyromegaly, no masses Heart: RRR, normal S1, S2, no murmurs Lungs: CTA bilaterally, no wheezes, rhonchi, or rales Abdomen: +bs, soft, non tender, non distended, no masses, no hepatomegaly, no splenomegaly Musculoskeletal: nontender, no swelling, no obvious deformity Extremities: no edema, no cyanosis, no clubbing Pulses: 2+ symmetric, upper and lower extremities, normal cap refill Neurological: alert, oriented x 3, CN2-12 intact, strength normal upper extremities and lower extremities, sensation normal throughout, DTRs 2+ throughout, no cerebellar signs, gait normal Psychiatric: normal affect, behavior normal, pleasant   Medicare Attestation I have personally reviewed: The patient's medical and social history Their use of alcohol, tobacco or illicit drugs Their current medications and supplements The patient's functional ability including ADLs,fall risks, home safety risks, cognitive, and hearing and visual impairment Diet and physical activities Evidence for depression or mood disorders  The patient's weight, height, BMI, and visual acuity have been recorded in the chart.  I have made referrals, counseling, and provided education to the patient based on review of the above and I have provided the patient with a written personalized care plan for preventive services.     Izora Ribas, NP   09/14/2017

## 2017-09-14 ENCOUNTER — Ambulatory Visit: Payer: Medicare Other | Admitting: Adult Health

## 2017-09-14 ENCOUNTER — Encounter: Payer: Self-pay | Admitting: Adult Health

## 2017-09-14 VITALS — BP 126/78 | HR 54 | Temp 96.8°F | Ht 70.0 in | Wt 218.0 lb

## 2017-09-14 DIAGNOSIS — E559 Vitamin D deficiency, unspecified: Secondary | ICD-10-CM

## 2017-09-14 DIAGNOSIS — Z86010 Personal history of colonic polyps: Secondary | ICD-10-CM

## 2017-09-14 DIAGNOSIS — R7309 Other abnormal glucose: Secondary | ICD-10-CM

## 2017-09-14 DIAGNOSIS — N183 Chronic kidney disease, stage 3 unspecified: Secondary | ICD-10-CM

## 2017-09-14 DIAGNOSIS — Z0001 Encounter for general adult medical examination with abnormal findings: Secondary | ICD-10-CM | POA: Diagnosis not present

## 2017-09-14 DIAGNOSIS — K219 Gastro-esophageal reflux disease without esophagitis: Secondary | ICD-10-CM

## 2017-09-14 DIAGNOSIS — Z79899 Other long term (current) drug therapy: Secondary | ICD-10-CM

## 2017-09-14 DIAGNOSIS — R6889 Other general symptoms and signs: Secondary | ICD-10-CM

## 2017-09-14 DIAGNOSIS — E782 Mixed hyperlipidemia: Secondary | ICD-10-CM

## 2017-09-14 DIAGNOSIS — M5410 Radiculopathy, site unspecified: Secondary | ICD-10-CM

## 2017-09-14 DIAGNOSIS — I10 Essential (primary) hypertension: Secondary | ICD-10-CM | POA: Diagnosis not present

## 2017-09-14 DIAGNOSIS — E669 Obesity, unspecified: Secondary | ICD-10-CM

## 2017-09-14 DIAGNOSIS — Z0000 Encounter for general adult medical examination without abnormal findings: Secondary | ICD-10-CM

## 2017-09-14 MED ORDER — ATENOLOL 100 MG PO TABS: ORAL_TABLET | ORAL | 1 refills | Status: DC

## 2017-09-15 ENCOUNTER — Encounter: Payer: Self-pay | Admitting: Adult Health

## 2017-09-15 ENCOUNTER — Ambulatory Visit: Payer: Medicare Other | Admitting: Adult Health

## 2017-09-15 VITALS — BP 136/86 | HR 51 | Temp 97.7°F | Ht 70.0 in | Wt 218.0 lb

## 2017-09-15 DIAGNOSIS — L918 Other hypertrophic disorders of the skin: Secondary | ICD-10-CM

## 2017-09-15 DIAGNOSIS — I10 Essential (primary) hypertension: Secondary | ICD-10-CM | POA: Diagnosis not present

## 2017-09-15 DIAGNOSIS — D485 Neoplasm of uncertain behavior of skin: Secondary | ICD-10-CM | POA: Diagnosis not present

## 2017-09-15 LAB — CBC WITH DIFFERENTIAL/PLATELET
BASOS PCT: 0.8 %
Basophils Absolute: 50 cells/uL (ref 0–200)
EOS ABS: 208 {cells}/uL (ref 15–500)
Eosinophils Relative: 3.3 %
HEMATOCRIT: 47.3 % (ref 38.5–50.0)
HEMOGLOBIN: 15.8 g/dL (ref 13.2–17.1)
LYMPHS ABS: 1670 {cells}/uL (ref 850–3900)
MCH: 29.6 pg (ref 27.0–33.0)
MCHC: 33.4 g/dL (ref 32.0–36.0)
MCV: 88.7 fL (ref 80.0–100.0)
MPV: 9.2 fL (ref 7.5–12.5)
Monocytes Relative: 6.2 %
NEUTROS ABS: 3982 {cells}/uL (ref 1500–7800)
Neutrophils Relative %: 63.2 %
Platelets: 267 10*3/uL (ref 140–400)
RBC: 5.33 10*6/uL (ref 4.20–5.80)
RDW: 12.6 % (ref 11.0–15.0)
TOTAL LYMPHOCYTE: 26.5 %
WBC: 6.3 10*3/uL (ref 3.8–10.8)
WBCMIX: 391 {cells}/uL (ref 200–950)

## 2017-09-15 LAB — COMPLETE METABOLIC PANEL WITH GFR
AG RATIO: 1.8 (calc) (ref 1.0–2.5)
ALBUMIN MSPROF: 4.4 g/dL (ref 3.6–5.1)
ALT: 20 U/L (ref 9–46)
AST: 20 U/L (ref 10–35)
Alkaline phosphatase (APISO): 58 U/L (ref 40–115)
BUN / CREAT RATIO: 15 (calc) (ref 6–22)
BUN: 24 mg/dL (ref 7–25)
CALCIUM: 9.4 mg/dL (ref 8.6–10.3)
CO2: 29 mmol/L (ref 20–32)
Chloride: 107 mmol/L (ref 98–110)
Creat: 1.61 mg/dL — ABNORMAL HIGH (ref 0.70–1.18)
GFR, EST AFRICAN AMERICAN: 49 mL/min/{1.73_m2} — AB (ref >=60)
GFR, EST NON AFRICAN AMERICAN: 42 mL/min/{1.73_m2} — AB (ref >=60)
GLOBULIN: 2.5 g/dL (ref 1.9–3.7)
Glucose, Bld: 93 mg/dL (ref 65–99)
POTASSIUM: 5.1 mmol/L (ref 3.5–5.3)
SODIUM: 143 mmol/L (ref 135–146)
TOTAL PROTEIN: 6.9 g/dL (ref 6.1–8.1)
Total Bilirubin: 0.7 mg/dL (ref 0.2–1.2)

## 2017-09-15 LAB — LIPID PANEL
CHOL/HDL RATIO: 3.8 (calc) (ref <5.0)
Cholesterol: 130 mg/dL (ref <200)
HDL: 34 mg/dL — AB (ref >40)
LDL Cholesterol (Calc): 74 mg/dL (calc)
NON-HDL CHOLESTEROL (CALC): 96 mg/dL (ref <130)
Triglycerides: 134 mg/dL (ref <150)

## 2017-09-15 LAB — MAGNESIUM: Magnesium: 2 mg/dL (ref 1.5–2.5)

## 2017-09-15 LAB — TSH: TSH: 1.75 mIU/L (ref 0.40–4.50)

## 2017-09-15 MED ORDER — LOSARTAN POTASSIUM 50 MG PO TABS: ORAL_TABLET | ORAL | 1 refills | Status: DC

## 2017-09-15 NOTE — Progress Notes (Signed)
Assessment and Plan:   Arth was seen today for nevus.  Diagnoses and all orders for this visit:  Essential hypertension Medication: atenolol 50 mg daily x 1 week then stop, start losartan 50 mg daily Monitor blood pressure at home; call if consistently over 130/80 Continue DASH diet.   Reminder to go to the ER if any CP, SOB, nausea, dizziness, severe HA, changes vision/speech, left arm numbness and tingling and jaw pain.  Neoplasm of uncertain behavior of scalp/ Skin tag Removed in office without complication Instructed to keep the wounds dry and covered for 24-48h and clean thereafter. Warning signs of infection were reviewed.   Recommended that the patient use OTC acetaminophen and OTC ibuprofen as needed for pain.  Return as needed.  Further disposition pending results of labs. Discussed med's effects and SE's.   Over 15 minutes of exam, counseling, chart review, and critical decision making was performed.   Future Appointments  Date Time Provider [place]  12/19/2017 10:00 AM [name], MD [facility] None  09/27/2018 11:15 AM [name], NP [facility] None    ------------------------------------------------------------------------------------------------------------------   HPI BP 136/86   Pulse (!) 51   Temp 97.7 F (36.5 C)   Ht 5\' 10"  (1.778 m)   Wt 218 lb (98.9 kg)   SpO2 98%   BMI 31.28 kg/m   72 y.o.male presents for monitoring of blood pressure and removal of skin tag and benign appearing filamentous nevi at temple that patient reports is frequently irritated and bleeds due to irritation when wearing glasses.   Pre-operative Diagnosis: Classic skin tags (acrochordon), benign appearing filamentous nevi  Post-operative Diagnosis: Classic skin tags (acrochordon), benign appearing filamentous nevi  Locations:right temple/scalp  Indications:  Patient complains of filamentous mole and skin tag that are recurrently irritated.    Anesthesia: Bupivacaine 0.25% without epinephrine  Procedure Details  The risks (including bleeding and infection) and benefits of the procedure and Verbal informed consent obtained. Using sterile iris scissors, filamentous mole and 1 skin tag to right scalp were snipped off at their bases after cleansing with Alcohol.  Bleeding was controlled with electrocautery.   Procedure Code  11200 Skins tags up to 15  Findings: Pathognomonic benign lesions  not sent for pathological exam.  Condition: Stable  Complications: none.   Past Medical History:  Diagnosis Date  . Chronic kidney disease   . Dyslipidemia   . ED (erectile dysfunction)   . GERD (gastroesophageal reflux disease)   . History of kidney stones   . Hyperlipidemia   . Hypertension   . Vitamin D deficiency      Allergies  Allergen Reactions  . Ceftin [Cefuroxime Axetil] Hives  . Peanut-Containing Drug Products Other (See Comments)    Break out on nose     Current Outpatient Medications on File Prior to Visit  Medication Sig  . aspirin 81 MG tablet Take 81 mg by mouth at bedtime.   Marland Kitchen atenolol (TENORMIN) 100 MG tablet Take 1/2-1 tab daily for BP goal <130/80  . Cholecalciferol (HM VITAMIN D3) 2000 UNITS CAPS Take 2,000 Units by mouth at bedtime.   Marland Kitchen CINNAMON PO Take 1,000 mg by mouth daily.  . Flaxseed, Linseed, (FLAXSEED OIL PO) Take 1 capsule by mouth daily.  . Magnesium 250 MG TABS Take by mouth.  . Omega-3 Fatty Acids (FISH OIL PO) Take 1,200 mg by mouth at bedtime.   Marland Kitchen omeprazole (PRILOSEC) 20 MG capsule Take 1 capsule (20 mg total) by mouth daily.  . pravastatin (  PRAVACHOL) 40 MG tablet TAKE 1 TABLET BY MOUTH EVERY DAY  . sildenafil (REVATIO) 20 MG tablet Take 2 tablets as needed (Patient taking differently: Take 20 mg by mouth as needed. Take 1 tablet as needed)   No current facility-administered medications on file prior to visit.     ROS: all negative except above.   Physical Exam:  BP 136/86    Pulse (!) 51   Temp 97.7 F (36.5 C)   Ht 5\' 10"  (1.778 m)   Wt 218 lb (98.9 kg)   SpO2 98%   BMI 31.28 kg/m   General Appearance: Well nourished, in no apparent distress. Eyes: conjunctiva no swelling or erythema ENT/Mouth: Hearing normal.  Neck: Supple.  Respiratory: Respiratory effort normal Cardio: bradycardic with no MRGs.  Musculoskeletal: normal gait.  Skin: Warm, dry without rashes, ecchymosis. Skin tag to right temple, benign appearing filamentous nevi to right temple/scalp superior to auricle.  Neuro: Sensation intact.  Psych: Awake and oriented X 3, normal affect, Insight and Judgment appropriate.     Izora Ribas, NP 11:54 AM [facility]

## 2017-09-27 ENCOUNTER — Other Ambulatory Visit: Payer: Self-pay | Admitting: Physician Assistant

## 2017-09-27 DIAGNOSIS — E782 Mixed hyperlipidemia: Secondary | ICD-10-CM

## 2017-10-10 DIAGNOSIS — H02834 Dermatochalasis of left upper eyelid: Secondary | ICD-10-CM | POA: Diagnosis not present

## 2017-10-10 DIAGNOSIS — H2513 Age-related nuclear cataract, bilateral: Secondary | ICD-10-CM | POA: Diagnosis not present

## 2017-10-10 DIAGNOSIS — H02831 Dermatochalasis of right upper eyelid: Secondary | ICD-10-CM | POA: Diagnosis not present

## 2017-10-22 ENCOUNTER — Other Ambulatory Visit: Payer: Self-pay | Admitting: Physician Assistant

## 2017-10-22 DIAGNOSIS — I10 Essential (primary) hypertension: Secondary | ICD-10-CM

## 2017-10-26 ENCOUNTER — Ambulatory Visit (INDEPENDENT_AMBULATORY_CARE_PROVIDER_SITE_OTHER): Payer: Medicare Other | Admitting: *Deleted

## 2017-10-26 VITALS — Temp 97.1°F

## 2017-10-26 DIAGNOSIS — Z23 Encounter for immunization: Secondary | ICD-10-CM

## 2017-12-19 ENCOUNTER — Encounter: Payer: Self-pay | Admitting: Internal Medicine

## 2017-12-19 ENCOUNTER — Ambulatory Visit (INDEPENDENT_AMBULATORY_CARE_PROVIDER_SITE_OTHER): Payer: Medicare Other | Admitting: Internal Medicine

## 2017-12-19 VITALS — BP 136/78 | HR 64 | Temp 97.2°F | Resp 18 | Ht 70.0 in | Wt 219.8 lb

## 2017-12-19 DIAGNOSIS — Z0000 Encounter for general adult medical examination without abnormal findings: Secondary | ICD-10-CM | POA: Diagnosis not present

## 2017-12-19 DIAGNOSIS — Z79899 Other long term (current) drug therapy: Secondary | ICD-10-CM

## 2017-12-19 DIAGNOSIS — Z136 Encounter for screening for cardiovascular disorders: Secondary | ICD-10-CM

## 2017-12-19 DIAGNOSIS — N183 Chronic kidney disease, stage 3 unspecified: Secondary | ICD-10-CM

## 2017-12-19 DIAGNOSIS — K219 Gastro-esophageal reflux disease without esophagitis: Secondary | ICD-10-CM

## 2017-12-19 DIAGNOSIS — I10 Essential (primary) hypertension: Secondary | ICD-10-CM

## 2017-12-19 DIAGNOSIS — E782 Mixed hyperlipidemia: Secondary | ICD-10-CM | POA: Diagnosis not present

## 2017-12-19 DIAGNOSIS — Z1211 Encounter for screening for malignant neoplasm of colon: Secondary | ICD-10-CM

## 2017-12-19 DIAGNOSIS — E559 Vitamin D deficiency, unspecified: Secondary | ICD-10-CM | POA: Diagnosis not present

## 2017-12-19 DIAGNOSIS — R7303 Prediabetes: Secondary | ICD-10-CM

## 2017-12-19 DIAGNOSIS — Z125 Encounter for screening for malignant neoplasm of prostate: Secondary | ICD-10-CM

## 2017-12-19 DIAGNOSIS — N401 Enlarged prostate with lower urinary tract symptoms: Secondary | ICD-10-CM

## 2017-12-19 DIAGNOSIS — R7309 Other abnormal glucose: Secondary | ICD-10-CM

## 2017-12-19 DIAGNOSIS — Z1212 Encounter for screening for malignant neoplasm of rectum: Secondary | ICD-10-CM

## 2017-12-19 DIAGNOSIS — N138 Other obstructive and reflux uropathy: Secondary | ICD-10-CM

## 2017-12-19 DIAGNOSIS — Z0001 Encounter for general adult medical examination with abnormal findings: Secondary | ICD-10-CM

## 2017-12-19 DIAGNOSIS — Z87898 Personal history of other specified conditions: Secondary | ICD-10-CM | POA: Insufficient documentation

## 2017-12-19 NOTE — Progress Notes (Signed)
[facility]   [name], M.D.     [name]. [name], P.A.-C [name], [place]                [address] [place] [address] Telephone [phone] Telefax [phone] Annual  Screening/Preventative Visit  & Comprehensive Evaluation & Examination     This very nice 72 y.o. MWM  presents for a Screening /Preventative Visit & comprehensive evaluation and management of multiple medical co-morbidities.  Patient has been followed for HTN, HLD, Prediabetes and Vitamin D Deficiency.     HTN predates since 2006. Patient's BP has been controlled at home.  Today's BP was initially sl elevated & rechecked at goal - 136/78. Patient denies any cardiac symptoms as chest pain, palpitations, shortness of breath, dizziness or ankle swelling.     Patient's hyperlipidemia is controlled with diet and medications. Patient denies myalgias or other medication SE's. Last lipids were at goal: Lab Results  Component Value Date   CHOL 124 12/19/2017   HDL 34 (L) 12/19/2017   LDLCALC 70 12/19/2017   TRIG 117 12/19/2017   CHOLHDL 3.6 12/19/2017      Patient has hx/o prediabetes (A1c 5.7% / 2011) and patient denies reactive hypoglycemic symptoms, visual blurring, diabetic polys or paresthesias. Last A1c was Normal & at goal: Lab Results  Component Value Date   HGBA1C 5.4 12/19/2017       Finally, patient has history of Vitamin D Deficiency ("24" / 2008)  and last vitamin D was at goal: Lab Results  Component Value Date   VD25OH 81 12/19/2017   Current Outpatient Medications on File Prior to Visit  Medication Sig  . aspirin 81 MG tablet Take 81 mg by mouth at bedtime.   Marland Kitchen atenolol (TENORMIN) 100 MG tablet TAKE 1 TABLET A DAY FOR BLOOD PRESSURE  . Cholecalciferol (HM VITAMIN D3) 2000 UNITS CAPS Take 2,000 Units by mouth at bedtime.   Marland Kitchen CINNAMON PO Take 1,000 mg by mouth daily.  . Flaxseed,  Linseed, (FLAXSEED OIL PO) Take 1 capsule by mouth daily.  . Magnesium 250 MG TABS Take by mouth.  . Omega-3 Fatty Acids (FISH OIL PO) Take 1,200 mg by mouth at bedtime.   Marland Kitchen omeprazole (PRILOSEC) 20 MG capsule Take 1 capsule (20 mg total) by mouth daily.  . pravastatin (PRAVACHOL) 40 MG tablet TAKE 1 TABLET BY MOUTH EVERY DAY  . sildenafil (REVATIO) 20 MG tablet Take 2 tablets as needed (Patient taking differently: Take 20 mg by mouth as needed. Take 1 tablet as needed)   No current facility-administered medications on file prior to visit.    Allergies  Allergen Reactions  . Ceftin [Cefuroxime Axetil] Hives  . Peanut-Containing Drug Products Other (See Comments)    Break out on nose    Past Medical History:  Diagnosis Date  . Chronic kidney disease   . Dyslipidemia   . ED (erectile dysfunction)   . GERD (gastroesophageal reflux disease)   . History of kidney stones   . Hyperlipidemia   . Hypertension   . Vitamin D deficiency    Health Maintenance  Topic Date Due  . TETANUS/TDAP  06/14/2016  . Hepatitis C Screening  01/03/2018 (Originally August 29, 1945)  . COLONOSCOPY  04/30/2019  . INFLUENZA VACCINE  Completed  . PNA vac Low Risk Adult  Completed   Immunization History  Administered Date(s) Administered  . Influenza Whole 10/10/2012  . Influenza, High Dose Seasonal PF 11/07/2016, 10/26/2017  . Influenza-Unspecified 11/04/2014  . Pneumococcal Conjugate-13 02/05/2014  . Pneumococcal Polysaccharide-23 07/10/2012  . Td 03/07/2006   Last Colon - 04/29/2016 - Dr [facility] - recc 5 year f/u due Apr 2023  Past Surgical History:  Procedure Laterality Date  . ANTERIOR CERVICAL DECOMP/DISCECTOMY FUSION N/A 04/30/2014   Procedure: ANTERIOR CERVICAL DECOMPRESSION/DISCECTOMY FUSION 1 LEVEL;  Surgeon: Jeffrey Bob, MD;  Location: [facility];  Service: Orthopedics;  Laterality: N/A;  Anterior cervical decompression fusion, cervical 7-thoracic 1 with instrumentation and allograft  . HAMMER TOE  SURGERY  2012   left foot, 3rd toe  . LAPAROSCOPIC CHOLECYSTECTOMY  10/2010   Family History  Problem Relation Age of Onset  . Colon cancer Mother 7  . Ovarian cancer Mother   . Colon cancer Father 61  . Diabetes Father   . Alzheimer's disease Father   . Prostate cancer Father   . Stomach cancer Neg Hx    Social History   Socioeconomic History  . Marital status: Married    Spouse name: Jeffrey Carey  . Number of children: 1  Occupational History  . 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  Tobacco Use  . Smoking status: Never Smoker  . Smokeless tobacco: Never Used  Substance and Sexual Activity  . Alcohol use: No  . Drug use: No  . Sexual activity: Not on file  Social History Narrative   Lives with wife.  Was a Best boy.      ROS Constitutional: Denies fever, chills, weight loss/gain, headaches, insomnia,  night sweats or change in appetite. Does c/o fatigue. Eyes: Denies redness, blurred vision, diplopia, discharge, itchy or watery eyes.  ENT: Denies discharge, congestion, post nasal drip, epistaxis, sore throat, earache, hearing loss, dental pain, Tinnitus, Vertigo, Sinus pain or snoring.  Cardio: Denies chest pain, palpitations, irregular heartbeat, syncope, dyspnea, diaphoresis, orthopnea, PND, claudication or edema Respiratory: denies cough, dyspnea, DOE, pleurisy, hoarseness, laryngitis or wheezing.  Gastrointestinal: Denies dysphagia, heartburn, reflux, water brash, pain, cramps, nausea, vomiting, bloating, diarrhea, constipation, hematemesis, melena, hematochezia, jaundice or hemorrhoids Genitourinary: Denies dysuria, frequency, discharge, hematuria or flank pain. Has urgency, nocturia x 2-3 & occasional hesitancy. Musculoskeletal: Denies arthralgia, myalgia, stiffness, Jt. Swelling, pain, limp or strain/sprain. Denies Falls. Skin: Denies puritis, rash, hives, warts, acne, eczema or change in skin lesion Neuro: No weakness, tremor, incoordination,  spasms, paresthesia or pain Psychiatric: Denies confusion, memory loss or sensory loss. Denies Depression. Endocrine: Denies change in weight, skin, hair change, nocturia, and paresthesia, diabetic polys, visual blurring or hyper / hypo glycemic episodes.  Heme/Lymph: No excessive bleeding, bruising or enlarged lymph nodes.  Physical Exam  BP 136/78   Pulse 64   Temp (!) 97.2 F (36.2 C)   Resp 18   Ht 5\' 10"  (1.778 m)   Wt 219 lb 12.8 oz (99.7 kg)   BMI 31.54 kg/m   General Appearance: Well nourished and well groomed and in no apparent distress.  Eyes: PERRLA, EOMs, conjunctiva no swelling or erythema, normal fundi and vessels. Sinuses: No frontal/maxillary tenderness ENT/Mouth: EACs patent / TMs  nl. Nares clear without erythema, swelling, mucoid exudates. Oral hygiene is good. No erythema, swelling, or exudate. Tongue normal, non-obstructing. Tonsils not swollen or erythematous. Hearing normal.  Neck: Supple, thyroid not palpable. No bruits, nodes or JVD. Respiratory: Respiratory effort normal.  BS equal and clear bilateral without rales, rhonci, wheezing or stridor. Cardio: Heart sounds are  normal with regular rate and rhythm and no murmurs, rubs or gallops. Peripheral pulses are normal and equal bilaterally without edema. No aortic or femoral bruits. Chest: symmetric with normal excursions and percussion.  Abdomen: Soft, with Nl bowel sounds. Nontender, no guarding, rebound, hernias, masses, or organomegaly.  Lymphatics: Non tender without lymphadenopathy.  Musculoskeletal: Full ROM all peripheral extremities, joint stability, 5/5 strength, and normal gait. Skin: Warm and dry without rashes, lesions, cyanosis, clubbing or  ecchymosis.  Neuro: Cranial nerves intact, reflexes equal bilaterally. Normal muscle tone, no cerebellar symptoms. Sensation intact.  Pysch: Alert and oriented X 3 with normal affect, insight and judgment appropriate.   Assessment and Plan  1. Annual  Preventative/Screening Exam   2. Essential hypertension  - EKG 12-Lead - [place], RETROPERITNL ABD,  LTD - Urinalysis, Routine w reflex microscopic - Microalbumin / creatinine urine ratio - CBC with Differential/Platelet - COMPLETE METABOLIC PANEL WITH GFR - Magnesium - TSH  3. Hyperlipidemia, mixed  - EKG 12-Lead - [place], RETROPERITNL ABD,  LTD - Lipid panel - TSH  4. Abnormal glucose  - EKG 12-Lead - [place], RETROPERITNL ABD,  LTD - Hemoglobin A1c - Insulin, random  5. Vitamin D deficiency  - VITAMIN D 25 Hydroxyl  6. Prediabetes  - EKG 12-Lead - [place], RETROPERITNL ABD,  LTD - Hemoglobin A1c - Insulin, random  7. CKD (chronic kidney disease) stage 3, GFR 30-59 ml/min (HCC)  - Urinalysis, Routine w reflex microscopic - Microalbumin / creatinine urine ratio  8. Gastroesophageal reflux disease  - CBC with Differential/Platelet  9. Screening for colorectal cancer  - POC Hemoccult Bld/Stl  10. BPH with obstruction/lower urinary tract symptoms  - PSA  11. Prostate cancer screening  - PSA  12. Screening for ischemic heart disease  - EKG 12-Lead  13. Screening for AAA (aortic abdominal aneurysm)  - [place], RETROPERITNL ABD,  LTD  14. Medication management - Urinalysis, Routine w reflex microscopic - Microalbumin / creatinine urine ratio - CBC with Differential/Platelet - COMPLETE METABOLIC PANEL WITH GFR - Magnesium - Lipid panel - TSH - Hemoglobin A1c - Insulin, random - VITAMIN D 25 Hydroxyl        Patient was counseled in prudent diet, weight control to achieve/maintain BMI less than 25, BP monitoring, regular exercise and medications as discussed.  Discussed med effects and SE's. Routine screening labs and tests as requested with regular follow-up as recommended. Over 40 minutes of exam, counseling, chart review and high complex critical decision making was performed

## 2017-12-19 NOTE — Patient Instructions (Signed)

## 2017-12-20 LAB — URINALYSIS, ROUTINE W REFLEX MICROSCOPIC
BACTERIA UA: NONE SEEN /HPF
Bilirubin Urine: NEGATIVE
Glucose, UA: NEGATIVE
Hyaline Cast: NONE SEEN /LPF
Ketones, ur: NEGATIVE
Leukocytes, UA: NEGATIVE
Nitrite: NEGATIVE
Protein, ur: NEGATIVE
Specific Gravity, Urine: 1.022 (ref 1.001–1.03)
Squamous Epithelial / HPF: NONE SEEN /HPF (ref <=5)
WBC, UA: NONE SEEN /HPF (ref 0–5)
pH: <=5 (ref 5.0–8.0)

## 2017-12-20 LAB — TSH: TSH: 1.84 mIU/L (ref 0.40–4.50)

## 2017-12-20 LAB — COMPLETE METABOLIC PANEL WITH GFR
AG Ratio: 1.7 (calc) (ref 1.0–2.5)
ALT: 17 U/L (ref 9–46)
AST: 19 U/L (ref 10–35)
Albumin: 4.2 g/dL (ref 3.6–5.1)
Alkaline phosphatase (APISO): 60 U/L (ref 40–115)
BUN / CREAT RATIO: 18 (calc) (ref 6–22)
BUN: 27 mg/dL — AB (ref 7–25)
CO2: 29 mmol/L (ref 20–32)
Calcium: 9.6 mg/dL (ref 8.6–10.3)
Chloride: 105 mmol/L (ref 98–110)
Creat: 1.52 mg/dL — ABNORMAL HIGH (ref 0.70–1.18)
GFR, Est African American: 53 mL/min/{1.73_m2} — ABNORMAL LOW (ref >=60)
GFR, Est Non African American: 45 mL/min/{1.73_m2} — ABNORMAL LOW (ref >=60)
Globulin: 2.5 g/dL (calc) (ref 1.9–3.7)
Glucose, Bld: 97 mg/dL (ref 65–99)
Potassium: 5.5 mmol/L — ABNORMAL HIGH (ref 3.5–5.3)
Sodium: 139 mmol/L (ref 135–146)
Total Bilirubin: 0.6 mg/dL (ref 0.2–1.2)
Total Protein: 6.7 g/dL (ref 6.1–8.1)

## 2017-12-20 LAB — HEMOGLOBIN A1C
Hgb A1c MFr Bld: 5.4 % of total Hgb (ref <5.7)
Mean Plasma Glucose: 108 (calc)
eAG (mmol/L): 6 (calc)

## 2017-12-20 LAB — CBC WITH DIFFERENTIAL/PLATELET
Absolute Monocytes: 429 cells/uL (ref 200–950)
BASOS PCT: 1 %
Basophils Absolute: 58 cells/uL (ref 0–200)
Eosinophils Absolute: 238 cells/uL (ref 15–500)
Eosinophils Relative: 4.1 %
HCT: 48.2 % (ref 38.5–50.0)
Hemoglobin: 15.7 g/dL (ref 13.2–17.1)
Lymphs Abs: 1879 cells/uL (ref 850–3900)
MCH: 29.1 pg (ref 27.0–33.0)
MCHC: 32.6 g/dL (ref 32.0–36.0)
MCV: 89.4 fL (ref 80.0–100.0)
MPV: 9.1 fL (ref 7.5–12.5)
Monocytes Relative: 7.4 %
Neutro Abs: 3196 cells/uL (ref 1500–7800)
Neutrophils Relative %: 55.1 %
PLATELETS: 298 10*3/uL (ref 140–400)
RBC: 5.39 10*6/uL (ref 4.20–5.80)
RDW: 12.6 % (ref 11.0–15.0)
TOTAL LYMPHOCYTE: 32.4 %
WBC: 5.8 10*3/uL (ref 3.8–10.8)

## 2017-12-20 LAB — VITAMIN D 25 HYDROXY (VIT D DEFICIENCY, FRACTURES): Vit D, 25-Hydroxy: 81 ng/mL (ref 30–100)

## 2017-12-20 LAB — LIPID PANEL
Cholesterol: 124 mg/dL (ref <200)
HDL: 34 mg/dL — ABNORMAL LOW (ref >40)
LDL Cholesterol (Calc): 70 mg/dL (calc)
NON-HDL CHOLESTEROL (CALC): 90 mg/dL (ref <130)
Total CHOL/HDL Ratio: 3.6 (calc) (ref <5.0)
Triglycerides: 117 mg/dL (ref <150)

## 2017-12-20 LAB — PSA: PSA: 1.8 ng/mL (ref <=4.0)

## 2017-12-20 LAB — MICROALBUMIN / CREATININE URINE RATIO
Creatinine, Urine: 154 mg/dL (ref 20–320)
Microalb Creat Ratio: 8 mcg/mg creat (ref <30)
Microalb, Ur: 1.3 mg/dL

## 2017-12-20 LAB — INSULIN, RANDOM: Insulin: 21.3 u[IU]/mL — ABNORMAL HIGH (ref 2.0–19.6)

## 2017-12-20 LAB — MAGNESIUM: Magnesium: 2 mg/dL (ref 1.5–2.5)

## 2017-12-24 ENCOUNTER — Encounter: Payer: Self-pay | Admitting: Internal Medicine

## 2018-01-01 DIAGNOSIS — H02834 Dermatochalasis of left upper eyelid: Secondary | ICD-10-CM | POA: Diagnosis not present

## 2018-01-01 DIAGNOSIS — H02831 Dermatochalasis of right upper eyelid: Secondary | ICD-10-CM | POA: Diagnosis not present

## 2018-01-01 DIAGNOSIS — H2513 Age-related nuclear cataract, bilateral: Secondary | ICD-10-CM | POA: Diagnosis not present

## 2018-01-05 DIAGNOSIS — H00021 Hordeolum internum right upper eyelid: Secondary | ICD-10-CM | POA: Diagnosis not present

## 2018-01-05 DIAGNOSIS — H02834 Dermatochalasis of left upper eyelid: Secondary | ICD-10-CM | POA: Diagnosis not present

## 2018-01-05 DIAGNOSIS — H02831 Dermatochalasis of right upper eyelid: Secondary | ICD-10-CM | POA: Diagnosis not present

## 2018-01-05 DIAGNOSIS — H2513 Age-related nuclear cataract, bilateral: Secondary | ICD-10-CM | POA: Diagnosis not present

## 2018-01-10 DIAGNOSIS — H02831 Dermatochalasis of right upper eyelid: Secondary | ICD-10-CM | POA: Diagnosis not present

## 2018-01-10 DIAGNOSIS — H00021 Hordeolum internum right upper eyelid: Secondary | ICD-10-CM | POA: Diagnosis not present

## 2018-01-10 DIAGNOSIS — H2513 Age-related nuclear cataract, bilateral: Secondary | ICD-10-CM | POA: Diagnosis not present

## 2018-01-10 DIAGNOSIS — H02834 Dermatochalasis of left upper eyelid: Secondary | ICD-10-CM | POA: Diagnosis not present

## 2018-01-16 ENCOUNTER — Other Ambulatory Visit: Payer: Self-pay | Admitting: *Deleted

## 2018-01-16 DIAGNOSIS — Z1212 Encounter for screening for malignant neoplasm of rectum: Principal | ICD-10-CM

## 2018-01-16 DIAGNOSIS — Z1211 Encounter for screening for malignant neoplasm of colon: Secondary | ICD-10-CM | POA: Diagnosis not present

## 2018-01-16 LAB — POC HEMOCCULT BLD/STL (HOME/3-CARD/SCREEN)
Card #2 Fecal Occult Blod, POC: NEGATIVE
Card #3 Fecal Occult Blood, POC: NEGATIVE
Fecal Occult Blood, POC: NEGATIVE

## 2018-01-17 DIAGNOSIS — H00021 Hordeolum internum right upper eyelid: Secondary | ICD-10-CM | POA: Diagnosis not present

## 2018-01-17 DIAGNOSIS — H02834 Dermatochalasis of left upper eyelid: Secondary | ICD-10-CM | POA: Diagnosis not present

## 2018-01-17 DIAGNOSIS — H2513 Age-related nuclear cataract, bilateral: Secondary | ICD-10-CM | POA: Diagnosis not present

## 2018-01-17 DIAGNOSIS — H02831 Dermatochalasis of right upper eyelid: Secondary | ICD-10-CM | POA: Diagnosis not present

## 2018-03-20 ENCOUNTER — Other Ambulatory Visit: Payer: Self-pay | Admitting: Physician Assistant

## 2018-03-20 DIAGNOSIS — E782 Mixed hyperlipidemia: Secondary | ICD-10-CM

## 2018-03-30 ENCOUNTER — Ambulatory Visit: Payer: Self-pay | Admitting: Adult Health

## 2018-05-12 ENCOUNTER — Other Ambulatory Visit: Payer: Self-pay | Admitting: Internal Medicine

## 2018-05-12 DIAGNOSIS — I10 Essential (primary) hypertension: Secondary | ICD-10-CM

## 2018-07-02 ENCOUNTER — Ambulatory Visit: Payer: Medicare Other | Admitting: Internal Medicine

## 2018-07-02 ENCOUNTER — Other Ambulatory Visit: Payer: Self-pay

## 2018-07-02 ENCOUNTER — Encounter: Payer: Self-pay | Admitting: Internal Medicine

## 2018-07-02 VITALS — BP 130/84 | HR 56 | Temp 97.6°F | Resp 16 | Ht 70.0 in | Wt 215.2 lb

## 2018-07-02 DIAGNOSIS — R7309 Other abnormal glucose: Secondary | ICD-10-CM

## 2018-07-02 DIAGNOSIS — E559 Vitamin D deficiency, unspecified: Secondary | ICD-10-CM | POA: Diagnosis not present

## 2018-07-02 DIAGNOSIS — E782 Mixed hyperlipidemia: Secondary | ICD-10-CM | POA: Diagnosis not present

## 2018-07-02 DIAGNOSIS — I10 Essential (primary) hypertension: Secondary | ICD-10-CM | POA: Diagnosis not present

## 2018-07-02 DIAGNOSIS — R7303 Prediabetes: Secondary | ICD-10-CM

## 2018-07-02 DIAGNOSIS — Z79899 Other long term (current) drug therapy: Secondary | ICD-10-CM

## 2018-07-02 DIAGNOSIS — K219 Gastro-esophageal reflux disease without esophagitis: Secondary | ICD-10-CM

## 2018-07-02 NOTE — Progress Notes (Signed)
History of Present Illness:       This very nice 73 y.o. MWM  presents for 6 month follow up with HTN, HLD, Pre-Diabetes and Vitamin D Deficiency. Patint has GERD controlled on his Omeprazole.      Patient is treated for HTN (2006) & BP has been controlled at home. Today's BP is at goal - 130/84. Patient has had no complaints of any cardiac type chest pain, palpitations, dyspnea / orthopnea / PND, dizziness, claudication, or dependent edema.      Hyperlipidemia is controlled with diet & Pravastatin. Patient denies myalgias or other med SE's. Last Lipids were at goal: Lab Results  Component Value Date   CHOL 124 12/19/2017   HDL 34 (L) 12/19/2017   LDLCALC 70 12/19/2017   TRIG 117 12/19/2017   CHOLHDL 3.6 12/19/2017       Also, the patient has history of PreDiabetes and has had no symptoms of reactive hypoglycemia, diabetic polys, paresthesias or visual blurring.  Last A1c was Normal & at goal: Lab Results  Component Value Date   HGBA1C 5.4 12/19/2017      Further, the patient also has history of Vitamin D Deficiency and supplements vitamin D without any suspected side-effects. Last vitamin D was at goal: Lab Results  Component Value Date   VD25OH 81 12/19/2017   Current Outpatient Medications on File Prior to Visit  Medication Sig  . aspirin 81 MG tablet Take 81 mg by mouth at bedtime.   Marland Kitchen atenolol (TENORMIN) 100 MG tablet Take 1 tablet Daily for BP  . Cholecalciferol (HM VITAMIN D3) 2000 UNITS CAPS Take 5,000 Units by mouth at bedtime.   Marland Kitchen CINNAMON PO Take 1,000 mg by mouth daily.  . Flaxseed, Linseed, (FLAXSEED OIL PO) Take 1 capsule by mouth daily.  . Magnesium 250 MG TABS Take by mouth.  . Omega-3 Fatty Acids (FISH OIL PO) Take 1,200 mg by mouth at bedtime.   Marland Kitchen omeprazole (PRILOSEC) 20 MG capsule Take 1 capsule (20 mg total) by mouth daily.  . pravastatin (PRAVACHOL) 40 MG tablet TAKE 1 TABLET BY MOUTH EVERY DAY  . sildenafil (REVATIO) 20 MG tablet Take 2 tablets as  needed (Patient taking differently: Take 20 mg by mouth as needed. Take 1 tablet as needed)   No current facility-administered medications on file prior to visit.    Allergies  Allergen Reactions  . Ceftin [Cefuroxime Axetil] Hives  . Peanut-Containing Drug Products Other (See Comments)    Break out on nose    PMHx:   Past Medical History:  Diagnosis Date  . Chronic kidney disease   . Dyslipidemia   . ED (erectile dysfunction)   . GERD (gastroesophageal reflux disease)   . History of kidney stones   . Hyperlipidemia   . Hypertension   . Vitamin D deficiency    Immunization History  Administered Date(s) Administered  . Influenza Whole 10/10/2012  . Influenza, High Dose Seasonal PF 11/07/2016, 10/26/2017  . Influenza-Unspecified 11/04/2014  . Pneumococcal Conjugate-13 02/05/2014  . Pneumococcal Polysaccharide-23 07/10/2012  . Td 03/07/2006   Past Surgical History:  Procedure Laterality Date  . ANTERIOR CERVICAL DECOMP/DISCECTOMY FUSION N/A 04/30/2014   Procedure: ANTERIOR CERVICAL DECOMPRESSION/DISCECTOMY FUSION 1 LEVEL;  Surgeon: Phylliss Bob, MD;  Location: [facility];  Service: Orthopedics;  Laterality: N/A;  Anterior cervical decompression fusion, cervical 7-thoracic 1 with instrumentation and allograft  . HAMMER TOE SURGERY  2012   left foot, 3rd toe  . LAPAROSCOPIC CHOLECYSTECTOMY  10/2010  FHx:    Reviewed / unchanged  SHx:    Reviewed / unchanged   Systems Review:  Constitutional: Denies fever, chills, wt changes, headaches, insomnia, fatigue, night sweats, change in appetite. Eyes: Denies redness, blurred vision, diplopia, discharge, itchy, watery eyes.  ENT: Denies discharge, congestion, post nasal drip, epistaxis, sore throat, earache, hearing loss, dental pain, tinnitus, vertigo, sinus pain, snoring.  CV: Denies chest pain, palpitations, irregular heartbeat, syncope, dyspnea, diaphoresis, orthopnea, PND, claudication or edema. Respiratory: denies cough, dyspnea,  DOE, pleurisy, hoarseness, laryngitis, wheezing.  Gastrointestinal: Denies dysphagia, odynophagia, heartburn, reflux, water brash, abdominal pain or cramps, nausea, vomiting, bloating, diarrhea, constipation, hematemesis, melena, hematochezia  or hemorrhoids. Genitourinary: Denies dysuria, frequency, urgency, nocturia, hesitancy, discharge, hematuria or flank pain. Musculoskeletal: Denies arthralgias, myalgias, stiffness, jt. swelling, pain, limping or strain/sprain.  Skin: Denies pruritus, rash, hives, warts, acne, eczema or change in skin lesion(s). Neuro: No weakness, tremor, incoordination, spasms, paresthesia or pain. Psychiatric: Denies confusion, memory loss or sensory loss. Endo: Denies change in weight, skin or hair change.  Heme/Lymph: No excessive bleeding, bruising or enlarged lymph nodes.  Physical Exam  BP 130/84   Pulse (!) 56   Temp 97.6 F (36.4 C)   Resp 16   Ht 5\' 10"  (1.778 m)   Wt 215 lb 3.2 oz (97.6 kg)   BMI 30.88 kg/m   Appears  well nourished, well groomed  and in no distress.  Eyes: PERRLA, EOMs, conjunctiva no swelling or erythema. Sinuses: No frontal/maxillary tenderness ENT/Mouth: EAC's clear, TM's nl w/o erythema, bulging. Nares clear w/o erythema, swelling, exudates. Oropharynx clear without erythema or exudates. Oral hygiene is good. Tongue normal, non obstructing. Hearing intact.  Neck: Supple. Thyroid not palpable. Car 2+/2+ without bruits, nodes or JVD. Chest: Respirations nl with BS clear & equal w/o rales, rhonchi, wheezing or stridor.  Cor: Heart sounds normal w/ regular rate and rhythm without sig. murmurs, gallops, clicks or rubs. Peripheral pulses normal and equal  without edema.  Abdomen: Soft & bowel sounds normal. Non-tender w/o guarding, rebound, hernias, masses or organomegaly.  Lymphatics: Unremarkable.  Musculoskeletal: Full ROM all peripheral extremities, joint stability, 5/5 strength and normal gait.  Skin: Warm, dry without exposed  rashes, lesions or ecchymosis apparent.  Neuro: Cranial nerves intact, reflexes equal bilaterally. Sensory-motor testing grossly intact. Tendon reflexes grossly intact.  Pysch: Alert & oriented x 3.  Insight and judgement nl & appropriate. No ideations.  Assessment and Plan:  1. Essential hypertension  - Continue medication, monitor blood pressure at home.  - Continue DASH diet.  Reminder to go to the ER if any CP,  SOB, nausea, dizziness, severe HA, changes vision/speech.  - CBC with Differential/Platelet - COMPLETE METABOLIC PANEL WITH GFR - Magnesium - TSH  2. Hyperlipidemia, mixed  - Continue diet/meds, exercise,& lifestyle modifications.  - Continue monitor periodic cholesterol/liver & renal functions   - Lipid panel - TSH  3. Abnormal glucose  - Insulin, random - Fructosamine  4. Vitamin D deficiency  - Continue diet, exercise  - Lifestyle modifications.  - Monitor appropriate labs. - Continue supplementation.  - VITAMIN D 25 Hydroxyl  5. Prediabetes  - Insulin, random - Fructosamine  6. Gastroesophageal reflux disease  - CBC with Differential/Platelet  7. Medication management  - CBC with Differential/Platelet - COMPLETE METABOLIC PANEL WITH GFR - Magnesium - Lipid panel - TSH - Insulin, random - VITAMIN D 25 Hydroxyl - Fructosamine       Discussed  regular exercise, BP monitoring, weight control  to achieve/maintain BMI less than 25 and discussed med and SE's. Recommended labs to assess and monitor clinical status with further disposition pending results of labs.  I discussed the assessment and treatment plan with the patient. The patient was provided an opportunity to ask questions and all were answered. The patient agreed with the plan and demonstrated an understanding of the instructions. I provided over 30 minutes of exam, counseling, chart review and  complex critical decision making.   Kirtland Bouchard, MD

## 2018-07-02 NOTE — Patient Instructions (Signed)

## 2018-07-06 LAB — COMPLETE METABOLIC PANEL WITH GFR
AG Ratio: 1.7 (calc) (ref 1.0–2.5)
ALT: 23 U/L (ref 9–46)
AST: 21 U/L (ref 10–35)
Albumin: 4.5 g/dL (ref 3.6–5.1)
Alkaline phosphatase (APISO): 64 U/L (ref 35–144)
BUN/Creatinine Ratio: 17 (calc) (ref 6–22)
BUN: 28 mg/dL — ABNORMAL HIGH (ref 7–25)
CO2: 26 mmol/L (ref 20–32)
Calcium: 9.9 mg/dL (ref 8.6–10.3)
Chloride: 110 mmol/L (ref 98–110)
Creat: 1.63 mg/dL — ABNORMAL HIGH (ref 0.70–1.18)
GFR, Est African American: 48 mL/min/{1.73_m2} — ABNORMAL LOW (ref >=60)
GFR, Est Non African American: 42 mL/min/{1.73_m2} — ABNORMAL LOW (ref >=60)
Globulin: 2.6 g/dL (calc) (ref 1.9–3.7)
Glucose, Bld: 94 mg/dL (ref 65–99)
Potassium: 5.1 mmol/L (ref 3.5–5.3)
Sodium: 138 mmol/L (ref 135–146)
Total Bilirubin: 0.6 mg/dL (ref 0.2–1.2)
Total Protein: 7.1 g/dL (ref 6.1–8.1)

## 2018-07-06 LAB — LIPID PANEL
Cholesterol: 138 mg/dL (ref <200)
HDL: 37 mg/dL — ABNORMAL LOW (ref >=40)
LDL Cholesterol (Calc): 77 mg/dL (calc)
Non-HDL Cholesterol (Calc): 101 mg/dL (calc) (ref <130)
Total CHOL/HDL Ratio: 3.7 (calc) (ref <5.0)
Triglycerides: 140 mg/dL (ref <150)

## 2018-07-06 LAB — FRUCTOSAMINE: Fructosamine: 234 umol/L (ref 205–285)

## 2018-07-06 LAB — CBC WITH DIFFERENTIAL/PLATELET
Absolute Monocytes: 429 cells/uL (ref 200–950)
Basophils Absolute: 73 cells/uL (ref 0–200)
Basophils Relative: 1.1 %
Eosinophils Absolute: 211 cells/uL (ref 15–500)
Eosinophils Relative: 3.2 %
HCT: 50.4 % — ABNORMAL HIGH (ref 38.5–50.0)
Hemoglobin: 16.8 g/dL (ref 13.2–17.1)
Lymphs Abs: 1709 cells/uL (ref 850–3900)
MCH: 30.1 pg (ref 27.0–33.0)
MCHC: 33.3 g/dL (ref 32.0–36.0)
MCV: 90.3 fL (ref 80.0–100.0)
MPV: 9.4 fL (ref 7.5–12.5)
Monocytes Relative: 6.5 %
Neutro Abs: 4178 cells/uL (ref 1500–7800)
Neutrophils Relative %: 63.3 %
Platelets: 276 10*3/uL (ref 140–400)
RBC: 5.58 10*6/uL (ref 4.20–5.80)
RDW: 13.1 % (ref 11.0–15.0)
Total Lymphocyte: 25.9 %
WBC: 6.6 10*3/uL (ref 3.8–10.8)

## 2018-07-06 LAB — VITAMIN D 25 HYDROXY (VIT D DEFICIENCY, FRACTURES): Vit D, 25-Hydroxy: 85 ng/mL (ref 30–100)

## 2018-07-06 LAB — TSH: TSH: 1.86 mIU/L (ref 0.40–4.50)

## 2018-07-06 LAB — INSULIN, RANDOM: Insulin: 23.8 u[IU]/mL — ABNORMAL HIGH

## 2018-07-06 LAB — MAGNESIUM: Magnesium: 2.2 mg/dL (ref 1.5–2.5)

## 2018-09-25 ENCOUNTER — Other Ambulatory Visit: Payer: Self-pay | Admitting: Physician Assistant

## 2018-09-25 ENCOUNTER — Other Ambulatory Visit: Payer: Self-pay | Admitting: Internal Medicine

## 2018-09-25 DIAGNOSIS — E782 Mixed hyperlipidemia: Secondary | ICD-10-CM

## 2018-09-25 MED ORDER — PRAVASTATIN SODIUM 40 MG PO TABS: ORAL_TABLET | ORAL | 3 refills | Status: DC

## 2018-09-27 ENCOUNTER — Ambulatory Visit: Payer: Self-pay | Admitting: Adult Health

## 2018-10-02 ENCOUNTER — Ambulatory Visit: Payer: Self-pay | Admitting: Adult Health

## 2018-10-08 NOTE — Progress Notes (Signed)
MEDICARE ANNUAL WELLNESS VISIT AND FOLLOW UP Assessment:    Encounter for Medicare annual wellness exam  Essential hypertension Monitor blood pressure at home; call if consistently over 130/80 Continue DASH diet.   Reminder to go to the ER if any CP, SOB, nausea, dizziness, severe HA, changes vision/speech, left arm numbness and tingling and jaw pain.  Gastroesophageal reflux disease, esophagitis presence not specified Well managed on current medications, PPI PRN <2/week Discussed diet, avoiding triggers and other lifestyle changes  Vitamin D deficiency At goal at recent check; continue to recommend supplementation for goal of 70-100 Defer vitamin D level  Other abnormal glucose Recent A1Cs at goal Discussed diet/exercise, weight management  Defer A1C; check CMP  Obesity (BMI 30.0-34.9) Long discussion about weight loss, diet, and exercise Recommended diet heavy in fruits and veggies and low in animal meats, cheeses, and dairy products, appropriate calorie intake Patient will work on cutting sugar, watching portions, increase walking Discussed appropriate weight for height and initial goal (210lb) Follow up at next visit  Medication management CBC, CMP/GFR, magnesium  Hyperlipidemia Continue medications: pravastatin 40 mg daily Continue low cholesterol diet and exercise.  Check lipid panel.   History of colonic polyps  UTD on colonoscopies, repeat 2021  CKD (chronic kidney disease) stage 3, GFR 30-59 ml/min (HCC) Increase fluids, avoid NSAIDS, monitor sugars, will monitor  Erectile dysfunction Sildenafil PRN works well, uses rarely   Need for influenza vaccination - high dose quad valent administered   Over 30 minutes of exam, counseling, chart review, and critical decision making was performed  Future Appointments  Date Time Provider [place]  01/07/2019  9:00 AM [name], MD [facility] None     Plan:   During the course of the visit the  patient was educated and counseled about appropriate screening and preventive services including:    Pneumococcal vaccine   Influenza vaccine  Prevnar 13  Td vaccine  Screening electrocardiogram  Colorectal cancer screening  Diabetes screening  Glaucoma screening  Nutrition counseling    Subjective:  Jeffrey Carey is a 73 y.o. male who presents for Medicare Annual Wellness Visit and 3 month follow up for HTN, hyperlipidemia, glucose management, and vitamin D Def.   His wife was diagnosed with breast cancer 3 months and has some stress related to this, has likely upcoming surgery.   he has a diagnosis of GERD which is currently managed by omeprazole 20 mg PRN, <2/week he reports symptoms is currently well controlled, and denies breakthrough reflux, burning in chest, hoarseness or cough.    BMI is Body mass index is 31.42 kg/m., he has not been working on diet and exercise, admits has been snacking too much, will restart walking. He does weigh himself regularly.  Wt Readings from Last 3 Encounters:  10/09/18 219 lb (99.3 kg)  07/02/18 215 lb 3.2 oz (97.6 kg)  12/19/17 219 lb 12.8 oz (99.7 kg)   His blood pressure has been controlled at home (130s/low 80s), today their BP is BP: 138/84 He does workout. He denies chest pain, shortness of breath, dizziness.   He is on cholesterol medication (pravastatin 40 mg daily) and denies myalgias. His cholesterol is at goal. The cholesterol last visit was:   Lab Results  Component Value Date   CHOL 138 07/02/2018   HDL 37 (L) 07/02/2018   LDLCALC 77 07/02/2018   TRIG 140 07/02/2018   CHOLHDL 3.7 07/02/2018   He has been working on diet and exercise for history of prediabetes (  last elevation 5.7% in 2017), and denies increased appetite, nausea, paresthesia of the feet, polydipsia, polyuria, visual disturbances, vomiting and weight loss. Last A1C in the office was:  Lab Results  Component Value Date   HGBA1C 5.4 12/19/2017   Last  GFR Lab Results  Component Value Date   GFRNONAA 42 (L) 07/02/2018    Patient is on Vitamin D supplement.   Lab Results  Component Value Date   VD25OH 85 07/02/2018      Medication Review:   Current Outpatient Medications (Cardiovascular):  .  atenolol (TENORMIN) 100 MG tablet, Take 1 tablet Daily for BP .  pravastatin (PRAVACHOL) 40 MG tablet, Take 1 tablet at Bedtime for Cholesterol .  sildenafil (REVATIO) 20 MG tablet, Take 2 tablets as needed (Patient taking differently: Take 20 mg by mouth as needed. Take 1 tablet as needed)   Current Outpatient Medications (Analgesics):  .  aspirin 81 MG tablet, Take 81 mg by mouth at bedtime.    Current Outpatient Medications (Other):  Marland Kitchen  Cholecalciferol (HM VITAMIN D3) 2000 UNITS CAPS, Take 5,000 Units by mouth at bedtime.  Marland Kitchen  CINNAMON PO, Take 1,000 mg by mouth daily. .  Flaxseed, Linseed, (FLAXSEED OIL PO), Take 1 capsule by mouth daily. .  Magnesium 250 MG TABS, Take by mouth. .  Omega-3 Fatty Acids (FISH OIL PO), Take 1,200 mg by mouth at bedtime.  Marland Kitchen  omeprazole (PRILOSEC) 20 MG capsule, Take 1 capsule (20 mg total) by mouth daily.  Allergies: Allergies  Allergen Reactions  . Ceftin [Cefuroxime Axetil] Hives  . Peanut-Containing Drug Products Other (See Comments)    Break out on nose     Current Problems (verified) has History of colonic polyps; Essential hypertension; Hyperlipidemia, mixed; Abnormal glucose; Vitamin D deficiency; Gastroesophageal reflux disease; Obesity (BMI 30.0-34.9); CKD (chronic kidney disease) stage 3, GFR 30-59 ml/min; History of prediabetes; and ED (erectile dysfunction) on their problem list.  Screening Tests Immunization History  Administered Date(s) Administered  . Influenza Whole 10/10/2012  . Influenza, High Dose Seasonal PF 11/07/2016, 10/26/2017, 10/09/2018  . Influenza-Unspecified 11/04/2014  . Pneumococcal Conjugate-13 02/05/2014  . Pneumococcal Polysaccharide-23 07/10/2012  . Td  03/07/2006   Preventative care: Last colonoscopy: 04/2016 - 3 year f/u due 2021  Prior vaccinations: TD : 2008, would like to wait  Influenza: HD 2020 Pneumococcal: 07/2012 Prevnar/13: 02/05/2014 Shingles/Zostavax: declines due to 95$ co-pay  Names of Other Physician/Practitioners you currently use: 1. [facility] here for primary care 2. Dr. Katy Fitch, eye doctor, last visit 2019 3. Dr. Rollene Fare, dentist, last visit 2020, q39m  Patient Care Team: [name], MD as PCP - General (Internal Medicine) Kathie Rhodes, MD as Consulting Physician (Urology) Jolyne Loa, DDS (Dentistry) [facility])  Surgical: He  has a past surgical history that includes Laparoscopic cholecystectomy (10/2010); Hammer toe surgery (2012); and Anterior cervical decomp/discectomy fusion (N/A, 04/30/2014). Family His family history includes Alzheimer's disease in his father; Colon cancer (age of onset: 1) in his father and mother; Diabetes in his father; Ovarian cancer in his mother; Prostate cancer in his father. Social history  He reports that he has never smoked. He has never used smokeless tobacco. He reports that he does not drink alcohol or use drugs.  MEDICARE WELLNESS OBJECTIVES: Physical activity: Current Exercise Habits: The patient does not participate in regular exercise at present, Exercise limited by: Other - see comments(wife's health) Cardiac risk factors: Cardiac Risk Factors include: advanced age (>62men, >3 women);hypertension;male  gender;dyslipidemia;obesity (BMI >30kg/m2);sedentary lifestyle Depression/mood screen:   Depression screen [facility] 2/9 10/09/2018  Decreased Interest 0  Down, Depressed, Hopeless 0  PHQ - 2 Score 0    ADLs:  In your present state of health, do you have any difficulty performing the following activities: 10/09/2018 07/02/2018  Hearing? N N  Vision? N N  Difficulty concentrating or making decisions? N N  Walking or  climbing stairs? N N  Dressing or bathing? N N  Doing errands, shopping? N N  Some recent data might be hidden     Cognitive Testing  Alert? Yes  Normal Appearance?Yes  Oriented to person? Yes  Place? Yes   Time? Yes  Recall of three objects?  Yes  Can perform simple calculations? Yes  Displays appropriate judgment?Yes  Can read the correct time from a watch face?Yes  EOL planning: Does Patient Have a Medical Advance Directive?: Yes Type of Advance Directive: Healthcare Power of Attorney, Living will Does patient want to make changes to medical advance directive?: No - Patient declined Copy of [place] in Chart?: No - copy requested   Objective:   Today's Vitals   10/09/18 0921 10/09/18 0951  BP: (!) 146/86 138/84  Pulse: (!) 58   Temp: (!) 97.3 F (36.3 C)   SpO2: 98%   Weight: 219 lb (99.3 kg)   Height: 5\' 10"  (1.778 m)    Body mass index is 31.42 kg/m.  General appearance: alert, no distress, WD/WN, male HEENT: normocephalic, sclerae anicteric, TMs pearly, nares patent, no discharge or erythema, pharynx normal Oral cavity: MMM, no lesions Neck: supple, no lymphadenopathy, no thyromegaly, no masses Heart: RRR, normal S1, S2, no murmurs Lungs: CTA bilaterally, no wheezes, rhonchi, or rales Abdomen: +bs, soft, non tender, non distended, no masses, no hepatomegaly, no splenomegaly Musculoskeletal: nontender, no swelling, no obvious deformity Extremities: no edema, no cyanosis, no clubbing Pulses: 2+ symmetric, upper and lower extremities, normal cap refill Neurological: alert, oriented x 3, CN2-12 intact, strength normal upper extremities and lower extremities, sensation normal throughout, DTRs 2+ throughout, no cerebellar signs, gait normal Psychiatric: normal affect, behavior normal, pleasant   Medicare Attestation I have personally reviewed: The patient's medical and social history Their use of alcohol, tobacco or illicit drugs Their current  medications and supplements The patient's functional ability including ADLs,fall risks, home safety risks, cognitive, and hearing and visual impairment Diet and physical activities Evidence for depression or mood disorders  The patient's weight, height, BMI, and visual acuity have been recorded in the chart.  I have made referrals, counseling, and provided education to the patient based on review of the above and I have provided the patient with a written personalized care plan for preventive services.     Izora Ribas, NP   10/09/2018

## 2018-10-09 ENCOUNTER — Ambulatory Visit (INDEPENDENT_AMBULATORY_CARE_PROVIDER_SITE_OTHER): Payer: Medicare Other | Admitting: Adult Health

## 2018-10-09 ENCOUNTER — Other Ambulatory Visit: Payer: Self-pay

## 2018-10-09 ENCOUNTER — Encounter: Payer: Self-pay | Admitting: Adult Health

## 2018-10-09 VITALS — BP 138/84 | HR 58 | Temp 97.3°F | Ht 70.0 in | Wt 219.0 lb

## 2018-10-09 DIAGNOSIS — N529 Male erectile dysfunction, unspecified: Secondary | ICD-10-CM | POA: Insufficient documentation

## 2018-10-09 DIAGNOSIS — Z0001 Encounter for general adult medical examination with abnormal findings: Secondary | ICD-10-CM

## 2018-10-09 DIAGNOSIS — Z86010 Personal history of colonic polyps: Secondary | ICD-10-CM

## 2018-10-09 DIAGNOSIS — N1832 Chronic kidney disease, stage 3b: Secondary | ICD-10-CM | POA: Diagnosis not present

## 2018-10-09 DIAGNOSIS — Z87898 Personal history of other specified conditions: Secondary | ICD-10-CM

## 2018-10-09 DIAGNOSIS — E782 Mixed hyperlipidemia: Secondary | ICD-10-CM

## 2018-10-09 DIAGNOSIS — K219 Gastro-esophageal reflux disease without esophagitis: Secondary | ICD-10-CM

## 2018-10-09 DIAGNOSIS — R6889 Other general symptoms and signs: Secondary | ICD-10-CM | POA: Diagnosis not present

## 2018-10-09 DIAGNOSIS — Z23 Encounter for immunization: Secondary | ICD-10-CM

## 2018-10-09 DIAGNOSIS — E669 Obesity, unspecified: Secondary | ICD-10-CM

## 2018-10-09 DIAGNOSIS — E559 Vitamin D deficiency, unspecified: Secondary | ICD-10-CM

## 2018-10-09 DIAGNOSIS — N138 Other obstructive and reflux uropathy: Secondary | ICD-10-CM

## 2018-10-09 DIAGNOSIS — I10 Essential (primary) hypertension: Secondary | ICD-10-CM

## 2018-10-09 DIAGNOSIS — M5410 Radiculopathy, site unspecified: Secondary | ICD-10-CM

## 2018-10-09 DIAGNOSIS — Z0000 Encounter for general adult medical examination without abnormal findings: Secondary | ICD-10-CM

## 2018-10-09 DIAGNOSIS — R7309 Other abnormal glucose: Secondary | ICD-10-CM | POA: Diagnosis not present

## 2018-10-09 DIAGNOSIS — Z1159 Encounter for screening for other viral diseases: Secondary | ICD-10-CM

## 2018-10-09 DIAGNOSIS — N401 Enlarged prostate with lower urinary tract symptoms: Secondary | ICD-10-CM | POA: Diagnosis not present

## 2018-10-09 NOTE — Patient Instructions (Addendum)
[name] , Thank you for taking time to come for your Medicare Wellness Visit. I appreciate your ongoing commitment to your health goals. Please review the following plan we discussed and let me know if I can assist you in the future.   These are the goals we discussed: Goals    . Blood Pressure < 130/80    . Weight (lb) < 210 lb (95.3 kg)       This is a list of the screening recommended for you and due dates:  Health Maintenance  Topic Date Due  .  Hepatitis C: One time screening is recommended by Center for Disease Control  (CDC) for  adults born from [birth year] through 1964.   1945/04/25  . Tetanus Vaccine  06/14/2016  . Flu Shot  08/04/2018  . Colon Cancer Screening  04/30/2019  . Pneumonia vaccines  Completed     Please send or bring copies of advanced directives and living will (medically relevant portions only)   Can try melatonin 5mg -15 mg at night for sleep, can also do benadryl 25-50mg  at night for sleep.   If this does not help we can try prescription medication.  Also here is some information about good sleep hygiene.   Insomnia Insomnia is frequent trouble falling and/or staying asleep. Insomnia can be a long term problem or a short term problem. Both are common. Insomnia can be a short term problem when the wakefulness is related to a certain stress or worry. Long term insomnia is often related to ongoing stress during waking hours and/or poor sleeping habits. Overtime, sleep deprivation itself can make the problem worse. Every little thing feels more severe because you are overtired and your ability to cope is decreased. CAUSES   Stress, anxiety, and depression.  Poor sleeping habits.  Distractions such as TV in the bedroom.  Naps close to bedtime.  Engaging in emotionally charged conversations before bed.  Technical reading before sleep.  Alcohol and other sedatives. They may make the problem worse. They can hurt normal sleep patterns and normal dream  activity.  Stimulants such as caffeine for several hours prior to bedtime.  Pain syndromes and shortness of breath can cause insomnia.  Exercise late at night.  Changing time zones may cause sleeping problems (jet lag). It is sometimes helpful to have someone observe your sleeping patterns. They should look for periods of not breathing during the night (sleep apnea). They should also look to see how long those periods last. If you live alone or observers are uncertain, you can also be observed at a sleep clinic where your sleep patterns will be professionally monitored. Sleep apnea requires a checkup and treatment. Give your caregivers your medical history. Give your caregivers observations your family has made about your sleep.  SYMPTOMS   Not feeling rested in the morning.  Anxiety and restlessness at bedtime.  Difficulty falling and staying asleep. TREATMENT   Your caregiver may prescribe treatment for an underlying medical disorders. Your caregiver can give advice or help if you are using alcohol or other drugs for self-medication. Treatment of underlying problems will usually eliminate insomnia problems.  Medications can be prescribed for short time use. They are generally not recommended for lengthy use.  Over-the-counter sleep medicines are not recommended for lengthy use. They can be habit forming.  You can promote easier sleeping by making lifestyle changes such as:  Using relaxation techniques that help with breathing and reduce muscle tension.  Exercising earlier in the day.  Changing your diet and the time of your last meal. No night time snacks.  Establish a regular time to go to bed.  Counseling can help with stressful problems and worry.  Soothing music and white noise may be helpful if there are background noises you cannot remove.  Stop tedious detailed work at least one hour before bedtime. HOME CARE INSTRUCTIONS   Keep a diary. Inform your caregiver about  your progress. This includes any medication side effects. See your caregiver regularly. Take note of:  Times when you are asleep.  Times when you are awake during the night.  The quality of your sleep.  How you feel the next day. This information will help your caregiver care for you.  Get out of bed if you are still awake after 15 minutes. Read or do some quiet activity. Keep the lights down. Wait until you feel sleepy and go back to bed.  Keep regular sleeping and waking hours. Avoid naps.  Exercise regularly.  Avoid distractions at bedtime. Distractions include watching television or engaging in any intense or detailed activity like attempting to balance the household checkbook.  Develop a bedtime ritual. Keep a familiar routine of bathing, brushing your teeth, climbing into bed at the same time each night, listening to soothing music. Routines increase the success of falling to sleep faster.  Use relaxation techniques. This can be using breathing and muscle tension release routines. It can also include visualizing peaceful scenes. You can also help control troubling or intruding thoughts by keeping your mind occupied with boring or repetitive thoughts like the old concept of counting sheep. You can make it more creative like imagining planting one beautiful flower after another in your backyard garden.  During your day, work to eliminate stress. When this is not possible use some of the previous suggestions to help reduce the anxiety that accompanies stressful situations. MAKE SURE YOU:   Understand these instructions.  Will watch your condition.  Will get help right away if you are not doing well or get worse. Document Released: 12/18/1999 Document Revised: 03/14/2011 Document Reviewed: 01/17/2007 [facility] Patient Information 2015 [place], [place]. This information is not intended to replace advice given to you by your health care provider. Make sure you discuss any questions you  have with your health care provider.

## 2018-10-10 ENCOUNTER — Other Ambulatory Visit: Payer: Self-pay | Admitting: Adult Health

## 2018-10-10 DIAGNOSIS — E875 Hyperkalemia: Secondary | ICD-10-CM

## 2018-10-10 LAB — CBC WITH DIFFERENTIAL/PLATELET
Absolute Monocytes: 429 cells/uL (ref 200–950)
Basophils Absolute: 60 cells/uL (ref 0–200)
Basophils Relative: 0.9 %
Eosinophils Absolute: 221 cells/uL (ref 15–500)
Eosinophils Relative: 3.3 %
HCT: 50.4 % — ABNORMAL HIGH (ref 38.5–50.0)
Hemoglobin: 16.7 g/dL (ref 13.2–17.1)
Lymphs Abs: 1762 cells/uL (ref 850–3900)
MCH: 30 pg (ref 27.0–33.0)
MCHC: 33.1 g/dL (ref 32.0–36.0)
MCV: 90.6 fL (ref 80.0–100.0)
MPV: 9.3 fL (ref 7.5–12.5)
Monocytes Relative: 6.4 %
Neutro Abs: 4228 cells/uL (ref 1500–7800)
Neutrophils Relative %: 63.1 %
Platelets: 303 10*3/uL (ref 140–400)
RBC: 5.56 10*6/uL (ref 4.20–5.80)
RDW: 13.1 % (ref 11.0–15.0)
Total Lymphocyte: 26.3 %
WBC: 6.7 10*3/uL (ref 3.8–10.8)

## 2018-10-10 LAB — COMPLETE METABOLIC PANEL WITH GFR
AG Ratio: 1.6 (calc) (ref 1.0–2.5)
ALT: 20 U/L (ref 9–46)
AST: 19 U/L (ref 10–35)
Albumin: 4.4 g/dL (ref 3.6–5.1)
Alkaline phosphatase (APISO): 57 U/L (ref 35–144)
BUN/Creatinine Ratio: 20 (calc) (ref 6–22)
BUN: 31 mg/dL — ABNORMAL HIGH (ref 7–25)
CO2: 26 mmol/L (ref 20–32)
Calcium: 9.8 mg/dL (ref 8.6–10.3)
Chloride: 107 mmol/L (ref 98–110)
Creat: 1.52 mg/dL — ABNORMAL HIGH (ref 0.70–1.18)
GFR, Est African American: 52 mL/min/{1.73_m2} — ABNORMAL LOW (ref >=60)
GFR, Est Non African American: 45 mL/min/{1.73_m2} — ABNORMAL LOW (ref >=60)
Globulin: 2.7 g/dL (calc) (ref 1.9–3.7)
Glucose, Bld: 109 mg/dL — ABNORMAL HIGH (ref 65–99)
Potassium: 5.6 mmol/L — ABNORMAL HIGH (ref 3.5–5.3)
Sodium: 142 mmol/L (ref 135–146)
Total Bilirubin: 0.6 mg/dL (ref 0.2–1.2)
Total Protein: 7.1 g/dL (ref 6.1–8.1)

## 2018-10-10 LAB — LIPID PANEL
Cholesterol: 131 mg/dL (ref <200)
HDL: 36 mg/dL — ABNORMAL LOW (ref >=40)
LDL Cholesterol (Calc): 74 mg/dL (calc)
Non-HDL Cholesterol (Calc): 95 mg/dL (calc) (ref <130)
Total CHOL/HDL Ratio: 3.6 (calc) (ref <5.0)
Triglycerides: 131 mg/dL (ref <150)

## 2018-10-10 LAB — HEPATITIS C ANTIBODY
Hepatitis C Ab: NONREACTIVE
SIGNAL TO CUT-OFF: 0.02 (ref <1.00)

## 2018-10-10 LAB — MAGNESIUM: Magnesium: 2.1 mg/dL (ref 1.5–2.5)

## 2018-10-10 LAB — TSH: TSH: 1.38 mIU/L (ref 0.40–4.50)

## 2018-10-12 DIAGNOSIS — H0102A Squamous blepharitis right eye, upper and lower eyelids: Secondary | ICD-10-CM | POA: Diagnosis not present

## 2018-10-12 DIAGNOSIS — H2513 Age-related nuclear cataract, bilateral: Secondary | ICD-10-CM | POA: Diagnosis not present

## 2018-10-12 DIAGNOSIS — H16223 Keratoconjunctivitis sicca, not specified as Sjogren's, bilateral: Secondary | ICD-10-CM | POA: Diagnosis not present

## 2018-10-12 DIAGNOSIS — H02834 Dermatochalasis of left upper eyelid: Secondary | ICD-10-CM | POA: Diagnosis not present

## 2018-10-12 DIAGNOSIS — H0102B Squamous blepharitis left eye, upper and lower eyelids: Secondary | ICD-10-CM | POA: Diagnosis not present

## 2018-10-12 DIAGNOSIS — H02831 Dermatochalasis of right upper eyelid: Secondary | ICD-10-CM | POA: Diagnosis not present

## 2018-10-17 ENCOUNTER — Other Ambulatory Visit: Payer: Self-pay

## 2018-10-17 ENCOUNTER — Ambulatory Visit (INDEPENDENT_AMBULATORY_CARE_PROVIDER_SITE_OTHER): Payer: Medicare Other | Admitting: *Deleted

## 2018-10-17 DIAGNOSIS — E875 Hyperkalemia: Secondary | ICD-10-CM | POA: Diagnosis not present

## 2018-10-17 NOTE — Progress Notes (Signed)
Patient is here for a NV to recheck a BMP. He states he has increased his water intake and stopped drinking Power Ade, when he does yard work. He states he does not take NSAIDS and does not add any type of salt.

## 2018-10-18 LAB — BASIC METABOLIC PANEL WITH GFR
BUN/Creatinine Ratio: 20 (calc) (ref 6–22)
BUN: 32 mg/dL — ABNORMAL HIGH (ref 7–25)
CO2: 29 mmol/L (ref 20–32)
Calcium: 9.6 mg/dL (ref 8.6–10.3)
Chloride: 105 mmol/L (ref 98–110)
Creat: 1.6 mg/dL — ABNORMAL HIGH (ref 0.70–1.18)
GFR, Est African American: 49 mL/min/{1.73_m2} — ABNORMAL LOW (ref >=60)
GFR, Est Non African American: 42 mL/min/{1.73_m2} — ABNORMAL LOW (ref >=60)
Glucose, Bld: 95 mg/dL (ref 65–99)
Potassium: 5.4 mmol/L — ABNORMAL HIGH (ref 3.5–5.3)
Sodium: 141 mmol/L (ref 135–146)

## 2019-01-07 ENCOUNTER — Encounter: Payer: Self-pay | Admitting: Internal Medicine

## 2019-01-14 ENCOUNTER — Encounter: Payer: Self-pay | Admitting: Internal Medicine

## 2019-01-14 NOTE — Patient Instructions (Signed)

## 2019-01-14 NOTE — Progress Notes (Signed)
Annual  Screening/Preventative Visit  & Comprehensive Evaluation & Examination     This very nice 74 y.o. MWM presents for a Screening /Preventative Visit & comprehensive evaluation and management of multiple medical co-morbidities.  Patient has been followed for HTN, HLD, Prediabetes and Vitamin D Deficiency.     HTN predates circa 2006. Patient's BP has been controlled at home.  Today's BP is borderline elevated high normal at 140/88 today. Patient denies any cardiac symptoms as chest pain, palpitations, shortness of breath, dizziness or ankle swelling.     Patient's hyperlipidemia is controlled with diet and medications. Patient denies myalgias or other medication SE's. Last lipids were at goal:   Lab Results  Component Value Date   CHOL 131 10/09/2018   HDL 36 (L) 10/09/2018   LDLCALC 74 10/09/2018   TRIG 131 10/09/2018   CHOLHDL 3.6 10/09/2018      Patient has hx/o prediabetes (A1c 5.7% / 2011) and patient denies reactive hypoglycemic symptoms, visual blurring, diabetic polys or paresthesias. Last A1c was Normal & at goal:  Lab Results  Component Value Date   HGBA1C 5.4 12/19/2017       Finally, patient has history of Vitamin D Deficiency ("24" / 2008) and last vitamin D was at goal:  Lab Results  Component Value Date   VD25OH 85 07/02/2018   Current Outpatient Medications on File Prior to Visit  Medication Sig  . aspirin 81 MG tablet Take 81 mg by mouth at bedtime.   Marland Kitchen atenolol (TENORMIN) 100 MG tablet Take 1 tablet Daily for BP  . CINNAMON PO Take 1,000 mg by mouth daily.  . Flaxseed, Linseed, (FLAXSEED OIL PO) Take 1 capsule by mouth daily.  . Magnesium 250 MG TABS Take by mouth.  . Omega-3 Fatty Acids (FISH OIL PO) Take 1,200 mg by mouth at bedtime.   Marland Kitchen omeprazole (PRILOSEC) 20 MG capsule Take 1 capsule (20 mg total) by mouth daily.  . pravastatin (PRAVACHOL) 40 MG tablet Take 1 tablet at Bedtime for Cholesterol  . sildenafil (REVATIO) 20 MG tablet Take 2 tablets  as needed (Patient taking differently: Take 20 mg by mouth as needed. Take 1 tablet as needed)  . VITAMIN D PO Take 5,000 Units by mouth daily.   No current facility-administered medications on file prior to visit.   Allergies  Allergen Reactions  . Ceftin [Cefuroxime Axetil] Hives  . Peanut-Containing Drug Products Other (See Comments)    Break out on nose    Past Medical History:  Diagnosis Date  . Chronic kidney disease   . Dyslipidemia   . ED (erectile dysfunction)   . GERD (gastroesophageal reflux disease)   . History of kidney stones   . Hyperlipidemia   . Hypertension   . Radiculopathy 04/30/2014  . Vitamin D deficiency    Health Maintenance  Topic Date Due  . TETANUS/TDAP  10/09/2019 (Originally 06/14/2016)  . COLONOSCOPY  04/30/2019  . INFLUENZA VACCINE  Completed  . Hepatitis C Screening  Completed  . PNA vac Low Risk Adult  Completed   Immunization History  Administered Date(s) Administered  . Influenza Whole 10/10/2012  . Influenza, High Dose Seasonal PF 11/07/2016, 10/26/2017, 10/09/2018  . Influenza-Unspecified 11/04/2014  . Pneumococcal Conjugate-13 02/05/2014  . Pneumococcal Polysaccharide-23 07/10/2012  . Td 03/07/2006   Last Colon - 04/29/2016 - Dr [name] - recc 5 year f/u due Apr 2023   Past Surgical History:  Procedure Laterality Date  . ANTERIOR CERVICAL DECOMP/DISCECTOMY FUSION N/A 04/30/2014  Procedure: ANTERIOR CERVICAL DECOMPRESSION/DISCECTOMY FUSION 1 LEVEL;  Surgeon: Phylliss Bob, MD;  Location: [facility];  Service: Orthopedics;  Laterality: N/A;  Anterior cervical decompression fusion, cervical 7-thoracic 1 with instrumentation and allograft  . HAMMER TOE SURGERY  2012   left foot, 3rd toe  . LAPAROSCOPIC CHOLECYSTECTOMY  10/2010   Family History  Problem Relation Age of Onset  . Colon cancer Mother 21  . Ovarian cancer Mother   . Colon cancer Father 70  . Diabetes Father   . Alzheimer's disease Father   . Prostate cancer Father   .  Stomach cancer Neg Hx    Social History   Socioeconomic History  . Marital status: Married    Spouse name: Jeani Hawking  . Number of children: 1  Occupational History  . Not on file  Tobacco Use  . Smoking status: Never Smoker  . Smokeless tobacco: Never Used  Substance and Sexual Activity  . Alcohol use: No  . Drug use: No  . Sexual activity: Not on file     ROS Constitutional: Denies fever, chills, weight loss/gain, headaches, insomnia,  night sweats or change in appetite. Does c/o fatigue. Eyes: Denies redness, blurred vision, diplopia, discharge, itchy or watery eyes.  ENT: Denies discharge, congestion, post nasal drip, epistaxis, sore throat, earache, hearing loss, dental pain, Tinnitus, Vertigo, Sinus pain or snoring.  Cardio: Denies chest pain, palpitations, irregular heartbeat, syncope, dyspnea, diaphoresis, orthopnea, PND, claudication or edema Respiratory: denies cough, dyspnea, DOE, pleurisy, hoarseness, laryngitis or wheezing.  Gastrointestinal: Denies dysphagia, heartburn, reflux, water brash, pain, cramps, nausea, vomiting, bloating, diarrhea, constipation, hematemesis, melena, hematochezia, jaundice or hemorrhoids Genitourinary: Denies dysuria, frequency,  discharge, hematuria or flank pain. Has urgency, nocturia x 2-3 & occasional hesitancy. Musculoskeletal: Denies arthralgia, myalgia, stiffness, Jt. Swelling, pain, limp or strain/sprain. Denies Falls. Skin: Denies puritis, rash, hives, warts, acne, eczema or change in skin lesion Neuro: No weakness, tremor, incoordination, spasms, paresthesia or pain Psychiatric: Denies confusion, memory loss or sensory loss. Denies Depression. Endocrine: Denies change in weight, skin, hair change, nocturia, and paresthesia, diabetic polys, visual blurring or hyper / hypo glycemic episodes.  Heme/Lymph: No excessive bleeding, bruising or enlarged lymph nodes.  Physical Exam  BP 140/88   Pulse 64   Temp (!) 97 F (36.1 C)   Resp 16    Ht 5\' 10"  (1.778 m)   Wt 221 lb 9.6 oz (100.5 kg)   BMI 31.80 kg/m   General Appearance: Well nourished and well groomed and in no apparent distress.  Eyes: PERRLA, EOMs, conjunctiva no swelling or erythema, normal fundi and vessels. Sinuses: No frontal/maxillary tenderness ENT/Mouth: EACs patent / TMs  nl. Nares clear without erythema, swelling, mucoid exudates. Oral hygiene is good. No erythema, swelling, or exudate. Tongue normal, non-obstructing. Tonsils not swollen or erythematous. Hearing normal.  Neck: Supple, thyroid not palpable. No bruits, nodes or JVD. Respiratory: Respiratory effort normal.  BS equal and clear bilateral without rales, rhonci, wheezing or stridor. Cardio: Heart sounds are normal with regular rate and rhythm and no murmurs, rubs or gallops. Peripheral pulses are normal and equal bilaterally without edema. No aortic or femoral bruits. Chest: symmetric with normal excursions and percussion.  Abdomen: Soft, with Nl bowel sounds. Nontender, no guarding, rebound, hernias, masses, or organomegaly.  Lymphatics: Non tender without lymphadenopathy.  Musculoskeletal: Full ROM all peripheral extremities, joint stability, 5/5 strength, and normal gait. Skin: Warm and dry without rashes, lesions, cyanosis, clubbing or  ecchymosis.  Neuro: Cranial nerves intact, reflexes equal bilaterally.  Normal muscle tone, no cerebellar symptoms. Sensation intact.  Pysch: Alert and oriented X 3 with normal affect, insight and judgment appropriate.   Assessment and Plan  1. Annual Preventative/Screening Exam   2. Essential hypertension  - EKG 12-Lead - [place], retroperitnl abd,  ltd - Urinalysis, Routine w reflex microscopic - Microalbumin / Creatinine Urine Ratio - CBC with Diff - COMPLETE METABOLIC PANEL WITH GFR - Magnesium - TSH  3. Hyperlipidemia, mixed  - EKG 12-Lead - [place], retroperitnl abd,  ltd - Lipid Profile - TSH  4. Abnormal glucose  - EKG 12-Lead - Hemoglobin A1c  ([facility]) - Insulin, random  5. Vitamin D deficiency  - Vitamin D (25 hydroxy)  6. Gastroesophageal reflux disease  - CBC with Diff  7. Screening for colorectal cancer  - POC Hemoccult Bld/Stl   8. Prostate cancer screening  - PSA  9. BPH with obstruction/lower urinary tract symptoms  - PSA  10. Screening for ischemic heart disease  - EKG 12-Lead  11. Screening for AAA (aortic abdominal aneurysm)  - [place], retroperitnl abd,  ltd  12. Medication management  - Urinalysis, Routine w reflex microscopic - Microalbumin / Creatinine Urine Ratio - CBC with Diff - COMPLETE METABOLIC PANEL WITH GFR - Magnesium - Lipid Profile - TSH - Hemoglobin A1c ([facility]) - Insulin, random - Vitamin D (25 hydroxy)       Patient was counseled in prudent diet, weight control to achieve/maintain BMI less than 25, BP monitoring, regular exercise and medications as discussed.  Discussed med effects and SE's. Routine screening labs and tests as requested with regular follow-up as recommended. Over 40 minutes of exam, counseling, chart review and high complex critical decision making was performed   Kirtland Bouchard, MD

## 2019-01-15 ENCOUNTER — Ambulatory Visit (INDEPENDENT_AMBULATORY_CARE_PROVIDER_SITE_OTHER): Payer: Medicare Other | Admitting: Internal Medicine

## 2019-01-15 ENCOUNTER — Other Ambulatory Visit: Payer: Self-pay

## 2019-01-15 ENCOUNTER — Encounter: Payer: Self-pay | Admitting: Internal Medicine

## 2019-01-15 VITALS — BP 140/88 | HR 64 | Temp 97.0°F | Resp 16 | Ht 70.0 in | Wt 221.6 lb

## 2019-01-15 DIAGNOSIS — Z125 Encounter for screening for malignant neoplasm of prostate: Secondary | ICD-10-CM

## 2019-01-15 DIAGNOSIS — E559 Vitamin D deficiency, unspecified: Secondary | ICD-10-CM

## 2019-01-15 DIAGNOSIS — N138 Other obstructive and reflux uropathy: Secondary | ICD-10-CM

## 2019-01-15 DIAGNOSIS — R7309 Other abnormal glucose: Secondary | ICD-10-CM

## 2019-01-15 DIAGNOSIS — Z79899 Other long term (current) drug therapy: Secondary | ICD-10-CM

## 2019-01-15 DIAGNOSIS — Z0001 Encounter for general adult medical examination with abnormal findings: Secondary | ICD-10-CM

## 2019-01-15 DIAGNOSIS — E782 Mixed hyperlipidemia: Secondary | ICD-10-CM

## 2019-01-15 DIAGNOSIS — Z0000 Encounter for general adult medical examination without abnormal findings: Secondary | ICD-10-CM | POA: Diagnosis not present

## 2019-01-15 DIAGNOSIS — Z136 Encounter for screening for cardiovascular disorders: Secondary | ICD-10-CM | POA: Diagnosis not present

## 2019-01-15 DIAGNOSIS — K219 Gastro-esophageal reflux disease without esophagitis: Secondary | ICD-10-CM

## 2019-01-15 DIAGNOSIS — I10 Essential (primary) hypertension: Secondary | ICD-10-CM

## 2019-01-15 DIAGNOSIS — Z1211 Encounter for screening for malignant neoplasm of colon: Secondary | ICD-10-CM

## 2019-01-16 LAB — CBC WITH DIFFERENTIAL/PLATELET
Absolute Monocytes: 481 cells/uL (ref 200–950)
Basophils Absolute: 52 cells/uL (ref 0–200)
Basophils Relative: 0.8 %
Eosinophils Absolute: 228 cells/uL (ref 15–500)
Eosinophils Relative: 3.5 %
HCT: 50.5 % — ABNORMAL HIGH (ref 38.5–50.0)
Hemoglobin: 17.2 g/dL — ABNORMAL HIGH (ref 13.2–17.1)
Lymphs Abs: 1755 cells/uL (ref 850–3900)
MCH: 30.2 pg (ref 27.0–33.0)
MCHC: 34.1 g/dL (ref 32.0–36.0)
MCV: 88.6 fL (ref 80.0–100.0)
MPV: 9 fL (ref 7.5–12.5)
Monocytes Relative: 7.4 %
Neutro Abs: 3985 cells/uL (ref 1500–7800)
Neutrophils Relative %: 61.3 %
Platelets: 262 10*3/uL (ref 140–400)
RBC: 5.7 10*6/uL (ref 4.20–5.80)
RDW: 12.5 % (ref 11.0–15.0)
Total Lymphocyte: 27 %
WBC: 6.5 10*3/uL (ref 3.8–10.8)

## 2019-01-16 LAB — URINALYSIS, ROUTINE W REFLEX MICROSCOPIC
Bacteria, UA: NONE SEEN /HPF
Bilirubin Urine: NEGATIVE
Glucose, UA: NEGATIVE
Hyaline Cast: NONE SEEN /LPF
Ketones, ur: NEGATIVE
Leukocytes,Ua: NEGATIVE
Nitrite: NEGATIVE
Protein, ur: NEGATIVE
Specific Gravity, Urine: 1.023 (ref 1.001–1.03)
Squamous Epithelial / HPF: NONE SEEN /HPF (ref <=5)
WBC, UA: NONE SEEN /HPF (ref 0–5)
pH: 6 (ref 5.0–8.0)

## 2019-01-16 LAB — COMPLETE METABOLIC PANEL WITH GFR
AG Ratio: 1.9 (calc) (ref 1.0–2.5)
ALT: 21 U/L (ref 9–46)
AST: 20 U/L (ref 10–35)
Albumin: 4.4 g/dL (ref 3.6–5.1)
Alkaline phosphatase (APISO): 55 U/L (ref 35–144)
BUN/Creatinine Ratio: 17 (calc) (ref 6–22)
BUN: 26 mg/dL — ABNORMAL HIGH (ref 7–25)
CO2: 31 mmol/L (ref 20–32)
Calcium: 9.7 mg/dL (ref 8.6–10.3)
Chloride: 106 mmol/L (ref 98–110)
Creat: 1.56 mg/dL — ABNORMAL HIGH (ref 0.70–1.18)
GFR, Est African American: 51 mL/min/{1.73_m2} — ABNORMAL LOW (ref >=60)
GFR, Est Non African American: 44 mL/min/{1.73_m2} — ABNORMAL LOW (ref >=60)
Globulin: 2.3 g/dL (calc) (ref 1.9–3.7)
Glucose, Bld: 85 mg/dL (ref 65–99)
Potassium: 5.1 mmol/L (ref 3.5–5.3)
Sodium: 143 mmol/L (ref 135–146)
Total Bilirubin: 0.5 mg/dL (ref 0.2–1.2)
Total Protein: 6.7 g/dL (ref 6.1–8.1)

## 2019-01-16 LAB — LIPID PANEL
Cholesterol: 124 mg/dL (ref <200)
HDL: 33 mg/dL — ABNORMAL LOW (ref >=40)
LDL Cholesterol (Calc): 69 mg/dL (calc)
Non-HDL Cholesterol (Calc): 91 mg/dL (calc) (ref <130)
Total CHOL/HDL Ratio: 3.8 (calc) (ref <5.0)
Triglycerides: 139 mg/dL (ref <150)

## 2019-01-16 LAB — MICROALBUMIN / CREATININE URINE RATIO
Creatinine, Urine: 178 mg/dL (ref 20–320)
Microalb Creat Ratio: 4 mcg/mg creat (ref <30)
Microalb, Ur: 0.8 mg/dL

## 2019-01-16 LAB — MAGNESIUM: Magnesium: 2.2 mg/dL (ref 1.5–2.5)

## 2019-01-16 LAB — PSA: PSA: 1.8 ng/mL (ref <=4.0)

## 2019-01-16 LAB — VITAMIN D 25 HYDROXY (VIT D DEFICIENCY, FRACTURES): Vit D, 25-Hydroxy: 82 ng/mL (ref 30–100)

## 2019-01-16 LAB — TSH: TSH: 1.58 mIU/L (ref 0.40–4.50)

## 2019-01-16 LAB — INSULIN, RANDOM: Insulin: 61.6 u[IU]/mL — ABNORMAL HIGH

## 2019-01-16 LAB — HEMOGLOBIN A1C
Hgb A1c MFr Bld: 5.6 % of total Hgb (ref <5.7)
Mean Plasma Glucose: 114 (calc)
eAG (mmol/L): 6.3 (calc)

## 2019-02-26 ENCOUNTER — Encounter: Payer: Self-pay | Admitting: Internal Medicine

## 2019-02-26 ENCOUNTER — Ambulatory Visit (INDEPENDENT_AMBULATORY_CARE_PROVIDER_SITE_OTHER): Payer: Medicare Other | Admitting: Internal Medicine

## 2019-02-26 ENCOUNTER — Other Ambulatory Visit: Payer: Self-pay

## 2019-02-26 VITALS — BP 138/80 | HR 56 | Temp 97.6°F | Resp 16 | Ht 70.0 in | Wt 221.0 lb

## 2019-02-26 DIAGNOSIS — L723 Sebaceous cyst: Secondary | ICD-10-CM | POA: Diagnosis not present

## 2019-02-26 DIAGNOSIS — L089 Local infection of the skin and subcutaneous tissue, unspecified: Secondary | ICD-10-CM | POA: Diagnosis not present

## 2019-02-26 DIAGNOSIS — S39012A Strain of muscle, fascia and tendon of lower back, initial encounter: Secondary | ICD-10-CM

## 2019-02-26 MED ORDER — DOXYCYCLINE HYCLATE 100 MG PO CAPS: ORAL_CAPSULE | ORAL | 0 refills | Status: DC

## 2019-02-26 MED ORDER — DEXAMETHASONE 4 MG PO TABS: ORAL_TABLET | ORAL | 0 refills | Status: DC

## 2019-02-26 NOTE — Progress Notes (Signed)
   Subjective:    Patient ID: Jeffrey Carey, male    DOB: 01-02-1946, 74 y.o.   MRN: [id number]  HPI    This very nice 74 yo MWM presents with c/o of a draining boil of the posterior rt shoulder that wife had been trying to express cyst detritus which he related appeared infected.      Patient also relates an episode 1 week ago while bending to lift a heavy object that he "pulled" his back. Denies sx's of sciatica.         Medication Sig  . aspirin 81 MG tablet Take 81 mg by mouth at bedtime.   Marland Kitchen atenolol (TENORMIN) 100 MG tablet Take 1 tablet Daily for BP  . CINNAMON PO Take 1,000 mg by mouth daily.  . Flaxseed, Linseed, (FLAXSEED OIL PO) Take 1 capsule by mouth daily.  . Magnesium 250 MG TABS Take by mouth.  . Omega-3 Fatty Acids (FISH OIL PO) Take 1,200 mg by mouth at bedtime.   Marland Kitchen omeprazole (PRILOSEC) 20 MG capsule Take 1 capsule (20 mg total) by mouth daily.  . pravastatin (PRAVACHOL) 40 MG tablet Take 1 tablet at Bedtime for Cholesterol  . sildenafil (REVATIO) 20 MG tablet Take 2 tablets as needed (Patient taking differently: Take 20 mg by mouth as needed. Take 1 tablet as needed)  . VITAMIN D PO Take 5,000 Units by mouth daily.    Allergies  Allergen Reactions  . Ceftin [Cefuroxime Axetil] Hives  . Peanut-Containing Drug Products Other (See Comments)    Break out on nose    Past Medical History:  Diagnosis Date  . Chronic kidney disease   . Dyslipidemia   . ED (erectile dysfunction)   . GERD (gastroesophageal reflux disease)   . History of kidney stones   . Hyperlipidemia   . Hypertension   . Radiculopathy 04/30/2014  . Vitamin D deficiency    Review of Systems   10 point systems review negative except as above.    Objective:   Physical Exam  BP 138/80   Pulse (!) 56   Temp 97.6 F (36.4 C)   Resp 16   Ht 5\' 10"  (1.778 m)   Wt 221 lb (100.2 kg)   BMI 31.71 kg/m   HEENT - WNL. Neck - supple.  Chest - Clear equal BS. Cor - Nl HS. RRR w/o sig MGR. PP 1(+).  No edema. MS- FROM w/o deformities. (+) slight tender low para-lumbar spasm.   (-) SLR . Neg fig. 4. Gait Nl. Neuro -  Nl w/o focal abnormalities. Skin - small sebaceous cyst (measuring less than 6-7 mm draining a  clear to yellow fluid when expressed. No appreciable purulent material.     Assessment & Plan:    1. Infected sebaceous cyst  - doxycycline  100 MG capsule; Take 1 capsule 2 x /day  for 5 days, then 1 capsule Daily  with food for Infection  Dispense: 30 capsule; Refill: 0  2. Strain of lumbar region, initial encounter  - dexamethasone 4 MG tablet; Take 1 tab 3 x day for 2  days, then 2 x day for 2  days, then 1 tab daily  Dispense: 13 tablet; Refill: 0

## 2019-03-20 ENCOUNTER — Other Ambulatory Visit: Payer: Self-pay | Admitting: Internal Medicine

## 2019-03-20 DIAGNOSIS — L723 Sebaceous cyst: Secondary | ICD-10-CM

## 2019-03-20 DIAGNOSIS — L089 Local infection of the skin and subcutaneous tissue, unspecified: Secondary | ICD-10-CM

## 2019-04-11 NOTE — Progress Notes (Signed)
MEDICARE ANNUAL WELLNESS VISIT AND FOLLOW UP Assessment:    Encounter for Medicare annual wellness exam  Essential hypertension Monitor blood pressure at home; call if consistently over 130/80 Continue DASH diet.   Reminder to go to the ER if any CP, SOB, nausea, dizziness, severe HA, changes vision/speech, left arm numbness and tingling and jaw pain.  Gastroesophageal reflux disease, esophagitis presence not specified Well managed on current medications, PPI PRN <2/week Discussed diet, avoiding triggers and other lifestyle changes  Vitamin D deficiency At goal at recent check; continue to recommend supplementation for goal of 70-100 Defer vitamin D level  Other abnormal glucose Recent A1Cs at goal Discussed diet/exercise, weight management  Defer A1C; check CMP  Obesity (BMI 30.0-34.9) Long discussion about weight loss, diet, and exercise Recommended diet heavy in fruits and veggies and low in animal meats, cheeses, and dairy products, appropriate calorie intake Patient will work on cutting sugar, watching portions, increase walking Discussed appropriate weight for height and initial goal (210lb) Follow up at next visit  Medication management CBC, CMP/GFR, magnesium  Hyperlipidemia Continue medications: pravastatin 40 mg daily Continue low cholesterol diet and exercise.  Check lipid panel.   History of colonic polyps  Due colonoscopy 04/2019 - will refer back   CKD (chronic kidney disease) stage 3b, GFR 30-44 ml/min (HCC) Increase fluids, avoid NSAIDS, monitor sugars, will monitor  Erectile dysfunction Sildenafil PRN works well, uses rarely   Lower back pain - negative straight leg  RICE, and exercise given If not better follow up in office or will refer to PT/orthopedics. Natural history and expected course discussed. Questions answered. Neurosurgeon distributed. Proper lifting, bending technique discussed. Stretching exercises discussed. Heat to  affected area as needed for local pain relief. OTC analgesics as needed.    Over 30 minutes of exam, counseling, chart review, and critical decision making was performed  Future Appointments  Date Time Provider Black  07/15/2019 10:30 AM [name], MD [facility] None  10/16/2019  9:00 AM [name], NP [facility] None  02/03/2020 11:00 AM [name], MD [facility] None     Plan:   During the course of the visit the patient was educated and counseled about appropriate screening and preventive services including:    Pneumococcal vaccine   Influenza vaccine  Prevnar 13  Td vaccine  Screening electrocardiogram  Colorectal cancer screening  Diabetes screening  Glaucoma screening  Nutrition counseling    Subjective:  Jeffrey Carey is a 74 y.o. male who presents for Medicare Annual Wellness Visit and 3 month follow up for HTN, hyperlipidemia, glucose management, and vitamin D Def.   His wife was diagnosed with breast cancer in 2020, she is undergoing chemo q3weeks, doing better.  He takes sildenafil 20 mg PRN for ED.   He completed a decadron taper for lower back pain; reports much improved; has some bil lower back pain in AM when wakes up, improves after movement. Doing heat and tylenol. Denies radicular sx, paresthesias, loss of bladder/bowel control.   he has a diagnosis of GERD which is currently managed by omeprazole 20 mg PRN, <2/week he reports symptoms is currently well controlled, and denies breakthrough reflux, burning in chest, hoarseness or cough.    BMI is Body mass index is 30.99 kg/m., he has been working on diet and exercise, admits has been snacking too much, working in yard, plans to restart walking. He does weigh himself regularly.  Wt Readings from Last 3 Encounters:  04/15/19 216 lb (98 kg)  02/26/19 221 lb (100.2 kg)  01/15/19 221 lb 9.6 oz (100.5 kg)   His blood pressure has been controlled at home (130s/low 80s),  today their BP is BP: 120/70 He does not workout. He denies chest pain, shortness of breath, dizziness.   He is on cholesterol medication (pravastatin 40 mg daily) and denies myalgias. His cholesterol is at goal. The cholesterol last visit was:   Lab Results  Component Value Date   CHOL 124 01/15/2019   HDL 33 (L) 01/15/2019   LDLCALC 69 01/15/2019   TRIG 139 01/15/2019   CHOLHDL 3.8 01/15/2019   He has been working on diet and exercise for history of prediabetes (last elevation 5.7% in 2017), and denies increased appetite, nausea, paresthesia of the feet, polydipsia, polyuria, visual disturbances, vomiting and weight loss. Last A1C in the office was:  Lab Results  Component Value Date   HGBA1C 5.6 01/15/2019   He has CKD IIIb without albuminuria monitored at this office, getting 3-4 bottles:  Lab Results  Component Value Date   GFRNONAA 44 (L) 01/15/2019    Patient is on Vitamin D supplement.   Lab Results  Component Value Date   VD25OH 82 01/15/2019      Medication Review:   Current Outpatient Medications (Cardiovascular):  .  atenolol (TENORMIN) 100 MG tablet, Take 1 tablet Daily for BP .  pravastatin (PRAVACHOL) 40 MG tablet, Take 1 tablet at Bedtime for Cholesterol .  sildenafil (REVATIO) 20 MG tablet, Take 2 tablets as needed (Patient taking differently: Take 20 mg by mouth as needed. Take 1 tablet as needed)   Current Outpatient Medications (Analgesics):  .  aspirin 81 MG tablet, Take 81 mg by mouth at bedtime.    Current Outpatient Medications (Other):  Marland Kitchen  CINNAMON PO, Take 1,000 mg by mouth daily. .  Flaxseed, Linseed, (FLAXSEED OIL PO), Take 1 capsule by mouth daily. .  Magnesium 250 MG TABS, Take by mouth. .  Omega-3 Fatty Acids (FISH OIL PO), Take 1,200 mg by mouth at bedtime.  Marland Kitchen  omeprazole (PRILOSEC) 20 MG capsule, Take 1 capsule (20 mg total) by mouth daily. Marland Kitchen  VITAMIN D PO, Take 5,000 Units by mouth daily.  Allergies: Allergies  Allergen Reactions   . Ceftin [Cefuroxime Axetil] Hives  . Peanut-Containing Drug Products Other (See Comments)    Break out on nose     Current Problems (verified) has History of colonic polyps; Essential hypertension; Hyperlipidemia, mixed; Abnormal glucose; Vitamin D deficiency; Gastroesophageal reflux disease; Obesity (BMI 30.0-34.9); CKD (chronic kidney disease) stage 3, GFR 30-59 ml/min; History of prediabetes; and ED (erectile dysfunction) on their problem list.  Screening Tests Immunization History  Administered Date(s) Administered  . Influenza Whole 10/10/2012  . Influenza, High Dose Seasonal PF 11/07/2016, 10/26/2017, 10/09/2018  . Influenza-Unspecified 11/04/2014  . Pneumococcal Conjugate-13 02/05/2014  . Pneumococcal Polysaccharide-23 07/10/2012  . Td 03/07/2006   Preventative care: Last colonoscopy: 04/2016 - 3 year f/u due 2021 will refer back  CXR 02/2016  Prior vaccinations: TD : 2008, would like to wait  Influenza: 10/2018 Pneumococcal: 07/2012 Prevnar/13: 02/05/2014 Shingles/Zostavax: declines due to 95$ co-pay Covid 19: 2/2, 2021 - patient will bring card  Names of Other Physician/Practitioners you currently use: 1. [facility] here for primary care 2. Dr. Katy Fitch, eye doctor, last visit 2021 3. Dr. Rollene Fare, dentist, last visit 2021, q67m  Patient Care Team: [name], MD as PCP - General (Internal Medicine) Kathie Rhodes, MD as Consulting Physician (Urology)  Jolyne Loa, DDS (Dentistry) [facility])  Surgical: He  has a past surgical history that includes Laparoscopic cholecystectomy (10/2010); Hammer toe surgery (2012); and Anterior cervical decomp/discectomy fusion (N/A, 04/30/2014). Family His family history includes Alzheimer's disease in his father; Colon cancer (age of onset: 40) in his father and mother; Diabetes in his father; Ovarian cancer in his mother; Prostate cancer in his father. Social history  He  reports that he has never smoked. He has never used smokeless tobacco. He reports that he does not drink alcohol or use drugs.  MEDICARE WELLNESS OBJECTIVES: Physical activity: Current Exercise Habits: The patient does not participate in regular exercise at present, Exercise limited by: None identified Cardiac risk factors: Cardiac Risk Factors include: advanced age (>41men, >68 women);dyslipidemia;hypertension;male gender;obesity (BMI >30kg/m2);sedentary lifestyle Depression/mood screen:   Depression screen [facility] 2/9 04/15/2019  Decreased Interest 0  Down, Depressed, Hopeless 0  PHQ - 2 Score 0    ADLs:  In your present state of health, do you have any difficulty performing the following activities: 04/15/2019 01/14/2019  Hearing? N N  Vision? N N  Difficulty concentrating or making decisions? N N  Walking or climbing stairs? N N  Dressing or bathing? N N  Doing errands, shopping? N N  Some recent data might be hidden     Cognitive Testing  Alert? Yes  Normal Appearance?Yes  Oriented to person? Yes  Place? Yes   Time? Yes  Recall of three objects?  Yes  Can perform simple calculations? Yes  Displays appropriate judgment?Yes  Can read the correct time from a watch face?Yes  EOL planning: Does Patient Have a Medical Advance Directive?: Yes Type of Advance Directive: Healthcare Power of Attorney, Living will Does patient want to make changes to medical advance directive?: No - Patient declined Copy of [place] in Chart?: No - copy requested   Objective:   Today's Vitals   04/15/19 1016  BP: 120/70  Pulse: (!) 59  Temp: (!) 97.5 F (36.4 C)  SpO2: 98%  Weight: 216 lb (98 kg)  Height: 5\' 10"  (1.778 m)   Body mass index is 30.99 kg/m.  General appearance: alert, no distress, WD/WN, male HEENT: normocephalic, sclerae anicteric, TMs pearly, nares patent, no discharge or erythema, pharynx normal Oral cavity: MMM, no lesions Neck: supple, no  lymphadenopathy, no thyromegaly, no masses Heart: RRR, normal S1, S2, no murmurs Lungs: CTA bilaterally, no wheezes, rhonchi, or rales Abdomen: +bs, soft, non tender, non distended, no masses, no hepatomegaly, no splenomegaly Musculoskeletal: nontender, no swelling, no obvious deformity. Neg straight leg raise. No spinous tenderness, SI joint tenderness. Lumbar pain indicated over paraspinals.  Extremities: no edema, no cyanosis, no clubbing Pulses: 2+ symmetric, upper and lower extremities, normal cap refill Neurological: alert, oriented x 3, CN2-12 intact, strength normal upper extremities and lower extremities, sensation normal throughout, DTRs 2+ throughout, no cerebellar signs, gait normal Psychiatric: normal affect, behavior normal, pleasant   Medicare Attestation I have personally reviewed: The patient's medical and social history Their use of alcohol, tobacco or illicit drugs Their current medications and supplements The patient's functional ability including ADLs,fall risks, home safety risks, cognitive, and hearing and visual impairment Diet and physical activities Evidence for depression or mood disorders  The patient's weight, height, BMI, and visual acuity have been recorded in the chart.  I have made referrals, counseling, and provided education to the patient based on review of the above and I have provided the patient with a  written personalized care plan for preventive services.     Izora Ribas, NP   04/15/2019

## 2019-04-15 ENCOUNTER — Encounter: Payer: Self-pay | Admitting: Adult Health

## 2019-04-15 ENCOUNTER — Ambulatory Visit (INDEPENDENT_AMBULATORY_CARE_PROVIDER_SITE_OTHER): Payer: Medicare Other | Admitting: Adult Health

## 2019-04-15 ENCOUNTER — Other Ambulatory Visit: Payer: Self-pay

## 2019-04-15 VITALS — BP 120/70 | HR 59 | Temp 97.5°F | Ht 70.0 in | Wt 216.0 lb

## 2019-04-15 DIAGNOSIS — R6889 Other general symptoms and signs: Secondary | ICD-10-CM

## 2019-04-15 DIAGNOSIS — E782 Mixed hyperlipidemia: Secondary | ICD-10-CM | POA: Diagnosis not present

## 2019-04-15 DIAGNOSIS — K219 Gastro-esophageal reflux disease without esophagitis: Secondary | ICD-10-CM | POA: Diagnosis not present

## 2019-04-15 DIAGNOSIS — N1832 Chronic kidney disease, stage 3b: Secondary | ICD-10-CM | POA: Diagnosis not present

## 2019-04-15 DIAGNOSIS — Z86010 Personal history of colonic polyps: Secondary | ICD-10-CM

## 2019-04-15 DIAGNOSIS — E559 Vitamin D deficiency, unspecified: Secondary | ICD-10-CM | POA: Diagnosis not present

## 2019-04-15 DIAGNOSIS — R7309 Other abnormal glucose: Secondary | ICD-10-CM | POA: Diagnosis not present

## 2019-04-15 DIAGNOSIS — Z0000 Encounter for general adult medical examination without abnormal findings: Secondary | ICD-10-CM

## 2019-04-15 DIAGNOSIS — Z87898 Personal history of other specified conditions: Secondary | ICD-10-CM | POA: Diagnosis not present

## 2019-04-15 DIAGNOSIS — N529 Male erectile dysfunction, unspecified: Secondary | ICD-10-CM

## 2019-04-15 DIAGNOSIS — I10 Essential (primary) hypertension: Secondary | ICD-10-CM

## 2019-04-15 DIAGNOSIS — E669 Obesity, unspecified: Secondary | ICD-10-CM

## 2019-04-15 DIAGNOSIS — Z0001 Encounter for general adult medical examination with abnormal findings: Secondary | ICD-10-CM

## 2019-04-15 NOTE — Patient Instructions (Addendum)
[name] , Thank you for taking time to come for your Medicare Wellness Visit. I appreciate your ongoing commitment to your health goals. Please review the following plan we discussed and let me know if I can assist you in the future.   These are the goals we discussed: Goals    . Blood Pressure < 130/80    . Weight (lb) < 210 lb (95.3 kg)       This is a list of the screening recommended for you and due dates:  Health Maintenance  Topic Date Due  . Tetanus Vaccine  10/09/2019*  . Colon Cancer Screening  04/30/2019  . Flu Shot  08/04/2019  .  Hepatitis C: One time screening is recommended by Center for Disease Control  (CDC) for  adults born from [birth year] through 1964.   Completed  . Pneumonia vaccines  Completed  *Topic was postponed. The date shown is not the original due date.    Please bring advanced directive/living will paper, covid 19 vaccine card      Back Exercises The following exercises strengthen the muscles that help to support the trunk and back. They also help to keep the lower back flexible. Doing these exercises can help to prevent back pain or lessen existing pain.  If you have back pain or discomfort, try doing these exercises 2-3 times each day or as told by your health care provider.  As your pain improves, do them once each day, but increase the number of times that you repeat the steps for each exercise (do more repetitions).  To prevent the recurrence of back pain, continue to do these exercises once each day or as told by your health care provider. Do exercises exactly as told by your health care provider and adjust them as directed. It is normal to feel mild stretching, pulling, tightness, or discomfort as you do these exercises, but you should stop right away if you feel sudden pain or your pain gets worse. Exercises Single knee to chest Repeat these steps 3-5 times for each leg: 1. Lie on your back on a firm bed or the floor with your legs extended. 2.  Bring one knee to your chest. Your other leg should stay extended and in contact with the floor. 3. Hold your knee in place by grabbing your knee or thigh with both hands and hold. 4. Pull on your knee until you feel a gentle stretch in your lower back or buttocks. 5. Hold the stretch for 10-30 seconds. 6. Slowly release and straighten your leg. Pelvic tilt Repeat these steps 5-10 times: 1. Lie on your back on a firm bed or the floor with your legs extended. 2. Bend your knees so they are pointing toward the ceiling and your feet are flat on the floor. 3. Tighten your lower abdominal muscles to press your lower back against the floor. This motion will tilt your pelvis so your tailbone points up toward the ceiling instead of pointing to your feet or the floor. 4. With gentle tension and even breathing, hold this position for 5-10 seconds. Cat-cow Repeat these steps until your lower back becomes more flexible: 1. Get into a hands-and-knees position on a firm surface. Keep your hands under your shoulders, and keep your knees under your hips. You may place padding under your knees for comfort. 2. Let your head hang down toward your chest. Contract your abdominal muscles and point your tailbone toward the floor so your lower back becomes rounded like  the back of a cat. 3. Hold this position for 5 seconds. 4. Slowly lift your head, let your abdominal muscles relax and point your tailbone up toward the ceiling so your back forms a sagging arch like the back of a cow. 5. Hold this position for 5 seconds.  Press-ups Repeat these steps 5-10 times: 1. Lie on your abdomen (face-down) on the floor. 2. Place your palms near your head, about shoulder-width apart. 3. Keeping your back as relaxed as possible and keeping your hips on the floor, slowly straighten your arms to raise the top half of your body and lift your shoulders. Do not use your back muscles to raise your upper torso. You may adjust the  placement of your hands to make yourself more comfortable. 4. Hold this position for 5 seconds while you keep your back relaxed. 5. Slowly return to lying flat on the floor.  Bridges Repeat these steps 10 times: 1. Lie on your back on a firm surface. 2. Bend your knees so they are pointing toward the ceiling and your feet are flat on the floor. Your arms should be flat at your sides, next to your body. 3. Tighten your buttocks muscles and lift your buttocks off the floor until your waist is at almost the same height as your knees. You should feel the muscles working in your buttocks and the back of your thighs. If you do not feel these muscles, slide your feet 1-2 inches farther away from your buttocks. 4. Hold this position for 3-5 seconds. 5. Slowly lower your hips to the starting position, and allow your buttocks muscles to relax completely. If this exercise is too easy, try doing it with your arms crossed over your chest. Abdominal crunches Repeat these steps 5-10 times: 1. Lie on your back on a firm bed or the floor with your legs extended. 2. Bend your knees so they are pointing toward the ceiling and your feet are flat on the floor. 3. Cross your arms over your chest. 4. Tip your chin slightly toward your chest without bending your neck. 5. Tighten your abdominal muscles and slowly raise your trunk (torso) high enough to lift your shoulder blades a tiny bit off the floor. Avoid raising your torso higher than that because it can put too much stress on your low back and does not help to strengthen your abdominal muscles. 6. Slowly return to your starting position. Back lifts Repeat these steps 5-10 times: 1. Lie on your abdomen (face-down) with your arms at your sides, and rest your forehead on the floor. 2. Tighten the muscles in your legs and your buttocks. 3. Slowly lift your chest off the floor while you keep your hips pressed to the floor. Keep the back of your head in line with the  curve in your back. Your eyes should be looking at the floor. 4. Hold this position for 3-5 seconds. 5. Slowly return to your starting position. Contact a health care provider if:  Your back pain or discomfort gets much worse when you do an exercise.  Your worsening back pain or discomfort does not lessen within 2 hours after you exercise. If you have any of these problems, stop doing these exercises right away. Do not do them again unless your health care provider says that you can. Get help right away if:  You develop sudden, severe back pain. If this happens, stop doing the exercises right away. Do not do them again unless your health care provider  says that you can. This information is not intended to replace advice given to you by your health care provider. Make sure you discuss any questions you have with your health care provider. Document Revised: 04/26/2018 Document Reviewed: 09/21/2017 Elsevier Patient Education  [place].

## 2019-04-16 ENCOUNTER — Encounter: Payer: Self-pay | Admitting: Gastroenterology

## 2019-04-16 LAB — CBC WITH DIFFERENTIAL/PLATELET
Absolute Monocytes: 539 cells/uL (ref 200–950)
Basophils Absolute: 54 cells/uL (ref 0–200)
Basophils Relative: 0.7 %
Eosinophils Absolute: 254 cells/uL (ref 15–500)
Eosinophils Relative: 3.3 %
HCT: 49.4 % (ref 38.5–50.0)
Hemoglobin: 16.5 g/dL (ref 13.2–17.1)
Lymphs Abs: 1648 cells/uL (ref 850–3900)
MCH: 29.7 pg (ref 27.0–33.0)
MCHC: 33.4 g/dL (ref 32.0–36.0)
MCV: 88.8 fL (ref 80.0–100.0)
MPV: 9.3 fL (ref 7.5–12.5)
Monocytes Relative: 7 %
Neutro Abs: 5205 cells/uL (ref 1500–7800)
Neutrophils Relative %: 67.6 %
Platelets: 263 10*3/uL (ref 140–400)
RBC: 5.56 10*6/uL (ref 4.20–5.80)
RDW: 13.4 % (ref 11.0–15.0)
Total Lymphocyte: 21.4 %
WBC: 7.7 10*3/uL (ref 3.8–10.8)

## 2019-04-16 LAB — LIPID PANEL
Cholesterol: 135 mg/dL (ref <200)
HDL: 36 mg/dL — ABNORMAL LOW (ref >=40)
LDL Cholesterol (Calc): 77 mg/dL (calc)
Non-HDL Cholesterol (Calc): 99 mg/dL (calc) (ref <130)
Total CHOL/HDL Ratio: 3.8 (calc) (ref <5.0)
Triglycerides: 141 mg/dL (ref <150)

## 2019-04-16 LAB — COMPLETE METABOLIC PANEL WITH GFR
AG Ratio: 1.8 (calc) (ref 1.0–2.5)
ALT: 19 U/L (ref 9–46)
AST: 20 U/L (ref 10–35)
Albumin: 4.4 g/dL (ref 3.6–5.1)
Alkaline phosphatase (APISO): 58 U/L (ref 35–144)
BUN/Creatinine Ratio: 17 (calc) (ref 6–22)
BUN: 24 mg/dL (ref 7–25)
CO2: 28 mmol/L (ref 20–32)
Calcium: 9.4 mg/dL (ref 8.6–10.3)
Chloride: 108 mmol/L (ref 98–110)
Creat: 1.38 mg/dL — ABNORMAL HIGH (ref 0.70–1.18)
GFR, Est African American: 59 mL/min/{1.73_m2} — ABNORMAL LOW (ref >=60)
GFR, Est Non African American: 51 mL/min/{1.73_m2} — ABNORMAL LOW (ref >=60)
Globulin: 2.5 g/dL (calc) (ref 1.9–3.7)
Glucose, Bld: 91 mg/dL (ref 65–99)
Potassium: 5.1 mmol/L (ref 3.5–5.3)
Sodium: 143 mmol/L (ref 135–146)
Total Bilirubin: 0.7 mg/dL (ref 0.2–1.2)
Total Protein: 6.9 g/dL (ref 6.1–8.1)

## 2019-04-16 LAB — MAGNESIUM: Magnesium: 2.1 mg/dL (ref 1.5–2.5)

## 2019-04-16 LAB — TSH: TSH: 1.22 mIU/L (ref 0.40–4.50)

## 2019-05-15 ENCOUNTER — Other Ambulatory Visit: Payer: Self-pay | Admitting: Internal Medicine

## 2019-05-15 DIAGNOSIS — I10 Essential (primary) hypertension: Secondary | ICD-10-CM

## 2019-06-14 ENCOUNTER — Ambulatory Visit (AMBULATORY_SURGERY_CENTER): Payer: Self-pay

## 2019-06-14 ENCOUNTER — Other Ambulatory Visit: Payer: Self-pay

## 2019-06-14 VITALS — Ht 70.0 in | Wt 218.8 lb

## 2019-06-14 DIAGNOSIS — Z86010 Personal history of colonic polyps: Secondary | ICD-10-CM

## 2019-06-14 DIAGNOSIS — Z800 Family history of malignant neoplasm of digestive organs: Secondary | ICD-10-CM

## 2019-06-14 NOTE — Progress Notes (Signed)
No allergies to soy or egg Pt is not on blood thinners or diet pills Denies issues with sedation/intubation Denies atrial flutter/fib Denies constipation   Pt is aware of Covid safety and care partner requirements.      

## 2019-06-20 ENCOUNTER — Encounter: Payer: Self-pay | Admitting: Gastroenterology

## 2019-06-28 ENCOUNTER — Other Ambulatory Visit: Payer: Self-pay

## 2019-06-28 ENCOUNTER — Encounter: Payer: Self-pay | Admitting: Gastroenterology

## 2019-06-28 ENCOUNTER — Ambulatory Visit (AMBULATORY_SURGERY_CENTER): Payer: Medicare Other | Admitting: Gastroenterology

## 2019-06-28 VITALS — BP 132/69 | HR 47 | Temp 97.7°F | Resp 19 | Ht 70.0 in | Wt 218.8 lb

## 2019-06-28 DIAGNOSIS — D124 Benign neoplasm of descending colon: Secondary | ICD-10-CM | POA: Diagnosis not present

## 2019-06-28 DIAGNOSIS — D122 Benign neoplasm of ascending colon: Secondary | ICD-10-CM | POA: Diagnosis not present

## 2019-06-28 DIAGNOSIS — D125 Benign neoplasm of sigmoid colon: Secondary | ICD-10-CM

## 2019-06-28 DIAGNOSIS — Z86010 Personal history of colonic polyps: Secondary | ICD-10-CM

## 2019-06-28 DIAGNOSIS — D123 Benign neoplasm of transverse colon: Secondary | ICD-10-CM

## 2019-06-28 MED ORDER — SODIUM CHLORIDE 0.9 % IV SOLN: 500.0000 mL | Freq: Once | INTRAVENOUS | Status: DC

## 2019-06-28 NOTE — Progress Notes (Signed)
VS- VV  2nd dose of covid vaccine receive in May 2021  Pt's states no medical or surgical changes since previsit or office visit.

## 2019-06-28 NOTE — Progress Notes (Signed)
pt tolerated well. VSS. awake and to recovery. Report given to RN.  

## 2019-06-28 NOTE — Progress Notes (Signed)
Called to room to assist during endoscopic procedure.  Patient ID and intended procedure confirmed with present staff. Received instructions for my participation in the procedure from the performing physician.  

## 2019-06-28 NOTE — Patient Instructions (Signed)
Handouts given for polyps and hemorrhoids. ? ?YOU HAD AN ENDOSCOPIC PROCEDURE TODAY AT THE [facility]:   Refer to the procedure report that was given to you for any specific questions about what was found during the examination.  If the procedure report does not answer your questions, please call your gastroenterologist to clarify.  If you requested that your care partner not be given the details of your procedure findings, then the procedure report has been included in a sealed envelope for you to review at your convenience later. ? ?YOU SHOULD EXPECT: Some feelings of bloating in the abdomen. Passage of more gas than usual.  Walking can help get rid of the air that was put into your GI tract during the procedure and reduce the bloating. If you had a lower endoscopy (such as a colonoscopy or flexible sigmoidoscopy) you may notice spotting of blood in your stool or on the toilet paper. If you underwent a bowel prep for your procedure, you may not have a normal bowel movement for a few days. ? ?Please Note:  You might notice some irritation and congestion in your nose or some drainage.  This is from the oxygen used during your procedure.  There is no need for concern and it should clear up in a day or so. ? ?SYMPTOMS TO REPORT IMMEDIATELY: ? ?Following lower endoscopy (colonoscopy or flexible sigmoidoscopy): ? Excessive amounts of blood in the stool ? Significant tenderness or worsening of abdominal pains ? Swelling of the abdomen that is new, acute ? Fever of 100?F or higher ? ?For urgent or emergent issues, a gastroenterologist can be reached at any hour by calling [phone]. ?Do not use MyChart messaging for urgent concerns.  ? ? ?DIET:  We do recommend a small meal at first, but then you may proceed to your regular diet.  Drink plenty of fluids but you should avoid alcoholic beverages for 24 hours. ? ?ACTIVITY:  You should plan to take it easy for the rest of today and you should NOT DRIVE or  use heavy machinery until tomorrow (because of the sedation medicines used during the test).   ? ?FOLLOW UP: ?Our staff will call the number listed on your records 48-72 hours following your procedure to check on you and address any questions or concerns that you may have regarding the information given to you following your procedure. If we do not reach you, we will leave a message.  We will attempt to reach you two times.  During this call, we will ask if you have developed any symptoms of COVID 19. If you develop any symptoms (ie: fever, flu-like symptoms, shortness of breath, cough etc.) before then, please call [phone].  If you test positive for Covid 19 in the 2 weeks post procedure, please call and report this information to us.   ? ?If any biopsies were taken you will be contacted by phone or by letter within the next 1-3 weeks.  Please call us at [phone] if you have not heard about the biopsies in 3 weeks.  ? ? ?SIGNATURES/CONFIDENTIALITY: ?You and/or your care partner have signed paperwork which will be entered into your electronic medical record.  These signatures attest to the fact that that the information above on your After Visit Summary has been reviewed and is understood.  Full responsibility of the confidentiality of this discharge information lies with you and/or your care-partner.  ?

## 2019-06-28 NOTE — Op Note (Signed)
[facility] Patient Name: Jeffrey Carey Procedure Date: 06/28/2019 1:08 PM MRN: [id number] Endoscopist: Mallie Mussel L. [name] , MD Age: 74 Referring MD:  Date of Birth: 12/19/1945 Gender: Male Account #: [id number] Procedure:                Colonoscopy Indications:              Surveillance: Personal history of adenomatous                            polyps on last colonoscopy 3 years ago (TA x 6 and                            SSP x 1 2018, TA x 1 2013) Medicines:                Monitored Anesthesia Care Procedure:                Pre-Anesthesia Assessment:                           - Prior to the procedure, a History and Physical                            was performed, and patient medications and                            allergies were reviewed. The patient's tolerance of                            previous anesthesia was also reviewed. The risks                            and benefits of the procedure and the sedation                            options and risks were discussed with the patient.                            All questions were answered, and informed consent                            was obtained. Prior Anticoagulants: The patient has                            taken no previous anticoagulant or antiplatelet                            agents except for aspirin. ASA Grade Assessment:                            III - A patient with severe systemic disease. After                            reviewing the risks and benefits, the patient was  deemed in satisfactory condition to undergo the                            procedure.                           After obtaining informed consent, the colonoscope                            was passed under direct vision. Throughout the                            procedure, the patient's blood pressure, pulse, and                            oxygen saturations were monitored continuously. The                             Colonoscope was introduced through the anus and                            advanced to the the cecum, identified by                            appendiceal orifice and ileocecal valve. The                            colonoscopy was performed without difficulty. The                            patient tolerated the procedure well. The quality                            of the bowel preparation was initially fair, then                            improved to good with lavage. The ileocecal valve,                            appendiceal orifice, and rectum were photographed.                            The bowel preparation used was Miralax. Scope In: 1:22:50 PM Scope Out: 1:46:47 PM Scope Withdrawal Time: 0 hours 20 minutes 33 seconds  Total Procedure Duration: 0 hours 23 minutes 57 seconds  Findings:                 The perianal and digital rectal examinations were                            normal.                           Four sessile polyps were found in the transverse  colon and ascending colon. The polyps were 3 to 8                            mm in size. These polyps were removed with a cold                            snare. Resection and retrieval were complete.                           Two sessile polyps were found in the sigmoid colon                            and descending colon. The polyps were 4 to 6 mm in                            size. These polyps were removed with a cold snare.                            Resection and retrieval were complete.                           Multiple diverticula were found in the left colon.                           Internal hemorrhoids were found.                           The exam was otherwise without abnormality on                            direct and retroflexion views. Complications:            No immediate complications. Estimated Blood Loss:     Estimated blood loss was minimal. Impression:                - Four 3 to 8 mm polyps in the transverse colon and                            in the ascending colon, removed with a cold snare.                            Resected and retrieved.                           - Two 4 to 6 mm polyps in the sigmoid colon and in                            the descending colon, removed with a cold snare.                            Resected and retrieved.                           -  Diverticulosis in the left colon.                           - Internal hemorrhoids.                           - The examination was otherwise normal on direct                            and retroflexion views. Recommendation:           - Patient has a contact number available for                            emergencies. The signs and symptoms of potential                            delayed complications were discussed with the                            patient. Return to normal activities tomorrow.                            Written discharge instructions were provided to the                            patient.                           - Resume previous diet.                           - Continue present medications.                           - Await pathology results.                           - Repeat colonoscopy in 3 years for surveillance.                            (Suprep or Plenvu for next exam) Isaish Alemu L. [name], MD 06/28/2019 1:52:59 PM This report has been signed electronically.

## 2019-07-02 ENCOUNTER — Telehealth: Payer: Self-pay

## 2019-07-02 NOTE — Telephone Encounter (Signed)
°  Follow up Call-  Call back number 06/28/2019  Post procedure Call Back phone  # [phone]  Permission to leave phone message Yes  Some recent data might be hidden     Patient questions:  Do you have a fever, pain , or abdominal swelling? No. Pain Score  0 *  Have you tolerated food without any problems? Yes.    Have you been able to return to your normal activities? Yes.    Do you have any questions about your discharge instructions: Diet   No. Medications  No. Follow up visit  No.  Do you have questions or concerns about your Care? No.  Actions: * If pain score is 4 or above: 1. No action needed, pain <4.Have you developed a fever since your procedure? no  2.   Have you had an respiratory symptoms (SOB or cough) since your procedure? no  3.   Have you tested positive for COVID 19 since your procedure no  4.   Have you had any family members/close contacts diagnosed with the COVID 19 since your procedure?  no   If yes to any of these questions please route to Joylene John, RN and Erenest Rasher, RN

## 2019-07-09 ENCOUNTER — Encounter: Payer: Self-pay | Admitting: Gastroenterology

## 2019-07-14 ENCOUNTER — Encounter: Payer: Self-pay | Admitting: Internal Medicine

## 2019-07-14 NOTE — Progress Notes (Signed)
History of Present Illness:       This very nice 74 y.o. MWM presents for 6 month follow up with HTN, HLD, Pre-Diabetes and Vitamin D Deficiency.       Patient is treated for HTN (2006) & BP has been controlled at home. Today's BP is at goal - 118/80. Patient has had no complaints of any cardiac type chest pain, palpitations, dyspnea / orthopnea / PND, dizziness, claudication, or dependent edema.      Hyperlipidemia is controlled with diet & meds. Patient denies myalgias or other med SE's. Last Lipids were at goal:  Lab Results  Component Value Date   CHOL 135 04/15/2019   HDL 36 (L) 04/15/2019   LDLCALC 77 04/15/2019   TRIG 141 04/15/2019   CHOLHDL 3.8 04/15/2019    Also, the patient has history of PreDiabetes (A1c 5.7%/2011)  and has had no symptoms of reactive hypoglycemia, diabetic polys, paresthesias or visual blurring.  Last A1c was Normal & at goal:  Lab Results  Component Value Date   HGBA1C 5.6 01/15/2019       Further, the patient also has history of Vitamin D Deficiency ("24"/2008) and supplements vitamin D without any suspected side-effects. Last vitamin D was at goal:  Lab Results  Component Value Date   VD25OH 82 01/15/2019    Current Outpatient Medications on File Prior to Visit  Medication Sig  . aspirin 81 MG tablet Take 81 mg by mouth at bedtime.   Marland Kitchen atenolol (TENORMIN) 100 MG tablet TAKE 1 TABLET BY MOUTH DAILY FOR BLOOD PRESSURE  . CINNAMON PO Take 1,000 mg by mouth daily.  . Flaxseed, Linseed, (FLAXSEED OIL PO) Take 1 capsule by mouth daily.  . Magnesium 250 MG TABS Take by mouth.  . Omega-3 Fatty Acids (FISH OIL PO) Take 1,200 mg by mouth at bedtime.   . pravastatin (PRAVACHOL) 40 MG tablet Take 1 tablet at Bedtime for Cholesterol  . VITAMIN D PO Take 5,000 Units by mouth daily.   No current facility-administered medications on file prior to visit.    Allergies  Allergen Reactions  . Ceftin [Cefuroxime Axetil] Hives  .  Peanut-Containing Drug Products Other (See Comments)    Break out on nose     PMHx:   Past Medical History:  Diagnosis Date  . Chronic kidney disease   . Dyslipidemia   . ED (erectile dysfunction)   . GERD (gastroesophageal reflux disease)   . History of kidney stones   . Hyperlipidemia   . Hypertension   . Radiculopathy 04/30/2014  . Vitamin D deficiency     Immunization History  Administered Date(s) Administered  . Influenza Whole 10/10/2012  . Influenza, High Dose Seasonal PF 11/07/2016, 10/26/2017, 10/09/2018  . Influenza-Unspecified 11/04/2014  . PFIZER SARS-COV-2 Vaccination 02/14/2019, 03/11/2019  . Pneumococcal Conjugate-13 02/05/2014  . Pneumococcal Polysaccharide-23 07/10/2012  . Td 03/07/2006    Past Surgical History:  Procedure Laterality Date  . ANTERIOR CERVICAL DECOMP/DISCECTOMY FUSION N/A 04/30/2014   Procedure: ANTERIOR CERVICAL DECOMPRESSION/DISCECTOMY FUSION 1 LEVEL;  Surgeon: Phylliss Bob, MD;  Location: [facility];  Service: Orthopedics;  Laterality: N/A;  Anterior cervical decompression fusion, cervical 7-thoracic 1 with instrumentation and allograft  . COLONOSCOPY    . HAMMER TOE SURGERY  2012   left foot, 3rd toe  . LAPAROSCOPIC CHOLECYSTECTOMY  10/2010    FHx:    Reviewed / unchanged  SHx:    Reviewed / unchanged   Systems Review:  Constitutional: Denies  fever, chills, wt changes, headaches, insomnia, fatigue, night sweats, change in appetite. Eyes: Denies redness, blurred vision, diplopia, discharge, itchy, watery eyes.  ENT: Denies discharge, congestion, post nasal drip, epistaxis, sore throat, earache, hearing loss, dental pain, tinnitus, vertigo, sinus pain, snoring.  CV: Denies chest pain, palpitations, irregular heartbeat, syncope, dyspnea, diaphoresis, orthopnea, PND, claudication or edema. Respiratory: denies cough, dyspnea, DOE, pleurisy, hoarseness, laryngitis, wheezing.  Gastrointestinal: Denies dysphagia, odynophagia, heartburn, reflux,  water brash, abdominal pain or cramps, nausea, vomiting, bloating, diarrhea, constipation, hematemesis, melena, hematochezia  or hemorrhoids. Genitourinary: Denies dysuria, frequency, urgency, nocturia, hesitancy, discharge, hematuria or flank pain. Musculoskeletal: Denies arthralgias, myalgias, stiffness, jt. swelling, pain, limping or strain/sprain.  Skin: Denies pruritus, rash, hives, warts, acne, eczema or change in skin lesion(s). Neuro: No weakness, tremor, incoordination, spasms, paresthesia or pain. Psychiatric: Denies confusion, memory loss or sensory loss. Endo: Denies change in weight, skin or hair change.  Heme/Lymph: No excessive bleeding, bruising or enlarged lymph nodes.  Physical Exam  BP 118/80   Pulse (!) 52   Temp 97.8 F (36.6 C)   Resp 16   Ht 5\' 10"  (1.778 m)   Wt 216 lb 3.2 oz (98.1 kg)   BMI 31.02 kg/m   Appears  well nourished, well groomed  and in no distress.  Eyes: PERRLA, EOMs, conjunctiva no swelling or erythema. Sinuses: No frontal/maxillary tenderness ENT/Mouth: EAC's clear, TM's nl w/o erythema, bulging. Nares clear w/o erythema, swelling, exudates. Oropharynx clear without erythema or exudates. Oral hygiene is good. Tongue normal, non obstructing. Hearing intact.  Neck: Supple. Thyroid not palpable. Car 2+/2+ without bruits, nodes or JVD. Chest: Respirations nl with BS clear & equal w/o rales, rhonchi, wheezing or stridor.  Cor: Heart sounds normal w/ regular rate and rhythm without sig. murmurs, gallops, clicks or rubs. Peripheral pulses normal and equal  without edema.  Abdomen: Soft & bowel sounds normal. Non-tender w/o guarding, rebound, hernias, masses or organomegaly.  Lymphatics: Unremarkable.  Musculoskeletal: Full ROM all peripheral extremities, joint stability, 5/5 strength and normal gait.  Skin: Warm, dry without exposed rashes, lesions or ecchymosis apparent.  Neuro: Cranial nerves intact, reflexes equal bilaterally. Sensory-motor  testing grossly intact. Tendon reflexes grossly intact.  Pysch: Alert & oriented x 3.  Insight and judgement nl & appropriate. No ideations.  Assessment and Plan:  1. Essential hypertension  - Continue medication, monitor blood pressure at home.  - Continue DASH diet.  Reminder to go to the ER if any CP,  SOB, nausea, dizziness, severe HA, changes vision/speech.  - CBC with Differential/Platelet - COMPLETE METABOLIC PANEL WITH GFR - Magnesium - TSH  2. Hyperlipidemia, mixed  - Continue diet/meds, exercise,& lifestyle modifications.  - Continue monitor periodic cholesterol/liver & renal functions   - Lipid panel - TSH  3. Abnormal glucose  - Continue diet, exercise  - Lifestyle modifications.  - Monitor appropriate labs.   - Hemoglobin A1c - Insulin, random  4. Vitamin D deficiency  - Continue supplementation.  - VITAMIN D 25 Hydroxy  5. Medication management  - CBC with Differential/Platelet - COMPLETE METABOLIC PANEL WITH GFR - Magnesium - Lipid panel - TSH - Hemoglobin A1c - Insulin, random - VITAMIN D 25 Hydroxy        Discussed  regular exercise, BP monitoring, weight control to achieve/maintain BMI less than 25 and discussed med and SE's. Recommended labs to assess and monitor clinical status with further disposition pending results of labs.  I discussed the assessment and treatment plan with the  patient. The patient was provided an opportunity to ask questions and all were answered. The patient agreed with the plan and demonstrated an understanding of the instructions.  I provided over 30 minutes of exam, counseling, chart review and  complex critical decision making.   Kirtland Bouchard, MD

## 2019-07-14 NOTE — Patient Instructions (Signed)

## 2019-07-15 ENCOUNTER — Other Ambulatory Visit: Payer: Self-pay

## 2019-07-15 ENCOUNTER — Ambulatory Visit: Payer: Medicare Other | Admitting: Internal Medicine

## 2019-07-15 VITALS — BP 118/80 | HR 52 | Temp 97.8°F | Resp 16 | Ht 70.0 in | Wt 216.2 lb

## 2019-07-15 DIAGNOSIS — E782 Mixed hyperlipidemia: Secondary | ICD-10-CM | POA: Diagnosis not present

## 2019-07-15 DIAGNOSIS — Z79899 Other long term (current) drug therapy: Secondary | ICD-10-CM | POA: Diagnosis not present

## 2019-07-15 DIAGNOSIS — I10 Essential (primary) hypertension: Secondary | ICD-10-CM | POA: Diagnosis not present

## 2019-07-15 DIAGNOSIS — E559 Vitamin D deficiency, unspecified: Secondary | ICD-10-CM

## 2019-07-15 DIAGNOSIS — Z0000 Encounter for general adult medical examination without abnormal findings: Secondary | ICD-10-CM | POA: Diagnosis not present

## 2019-07-15 DIAGNOSIS — R7309 Other abnormal glucose: Secondary | ICD-10-CM | POA: Diagnosis not present

## 2019-07-16 NOTE — Progress Notes (Signed)
=======================================================  -    Kidney functions look worse - like dehydrated   - Very important to drink more fluids - at least equivalent of   6 bottles  ( 16 oz ) = 96 oz of water or fluids /day to protect Kidneys & prevent permanent Kidney Damage ========================================================  -  Recommend call office to schedule a Nurse Visit & Lab in 3 weeks to  recheck Kidney  functions ========================================================  -  Total Chol = 128 - Great and LDL Chol = 73 - Both  Excellent   - Very low risk for Heart Attack  / Stroke ========================================================  - A1c - Normal - Great - No Diabetes ========================================================  -  Vitamin D = 91 - Excellent  ========================================================  -  All Else - CBC - Kidneys - Electrolytes - Liver - Magnesium & Thyroid    - all  Normal / OK ========================================================   - Keep up the [place] work & drink more fluids ! ========================================================

## 2019-07-17 LAB — HEMOGLOBIN A1C
Hgb A1c MFr Bld: 5.3 % of total Hgb (ref <5.7)
Mean Plasma Glucose: 105 (calc)
eAG (mmol/L): 5.8 (calc)

## 2019-07-17 LAB — LIPID PANEL
Cholesterol: 128 mg/dL (ref <200)
HDL: 35 mg/dL — ABNORMAL LOW (ref >=40)
LDL Cholesterol (Calc): 73 mg/dL (calc)
Non-HDL Cholesterol (Calc): 93 mg/dL (calc) (ref <130)
Total CHOL/HDL Ratio: 3.7 (calc) (ref <5.0)
Triglycerides: 114 mg/dL (ref <150)

## 2019-07-17 LAB — CBC WITH DIFFERENTIAL/PLATELET
Absolute Monocytes: 476 cells/uL (ref 200–950)
Basophils Absolute: 62 cells/uL (ref 0–200)
Basophils Relative: 0.9 %
Eosinophils Absolute: 228 cells/uL (ref 15–500)
Eosinophils Relative: 3.3 %
HCT: 53.4 % — ABNORMAL HIGH (ref 38.5–50.0)
Hemoglobin: 17.3 g/dL — ABNORMAL HIGH (ref 13.2–17.1)
Lymphs Abs: 1760 cells/uL (ref 850–3900)
MCH: 29.3 pg (ref 27.0–33.0)
MCHC: 32.4 g/dL (ref 32.0–36.0)
MCV: 90.5 fL (ref 80.0–100.0)
MPV: 9.1 fL (ref 7.5–12.5)
Monocytes Relative: 6.9 %
Neutro Abs: 4375 cells/uL (ref 1500–7800)
Neutrophils Relative %: 63.4 %
Platelets: 299 10*3/uL (ref 140–400)
RBC: 5.9 10*6/uL — ABNORMAL HIGH (ref 4.20–5.80)
RDW: 12.2 % (ref 11.0–15.0)
Total Lymphocyte: 25.5 %
WBC: 6.9 10*3/uL (ref 3.8–10.8)

## 2019-07-17 LAB — TSH: TSH: 2.41 mIU/L (ref 0.40–4.50)

## 2019-07-17 LAB — COMPLETE METABOLIC PANEL WITH GFR
AG Ratio: 1.8 (calc) (ref 1.0–2.5)
ALT: 22 U/L (ref 9–46)
AST: 19 U/L (ref 10–35)
Albumin: 4.5 g/dL (ref 3.6–5.1)
Alkaline phosphatase (APISO): 63 U/L (ref 35–144)
BUN/Creatinine Ratio: 16 (calc) (ref 6–22)
BUN: 28 mg/dL — ABNORMAL HIGH (ref 7–25)
CO2: 29 mmol/L (ref 20–32)
Calcium: 9.8 mg/dL (ref 8.6–10.3)
Chloride: 103 mmol/L (ref 98–110)
Creat: 1.72 mg/dL — ABNORMAL HIGH (ref 0.70–1.18)
GFR, Est African American: 45 mL/min/{1.73_m2} — ABNORMAL LOW (ref >=60)
GFR, Est Non African American: 39 mL/min/{1.73_m2} — ABNORMAL LOW (ref >=60)
Globulin: 2.5 g/dL (calc) (ref 1.9–3.7)
Glucose, Bld: 94 mg/dL (ref 65–99)
Potassium: 5.6 mmol/L — ABNORMAL HIGH (ref 3.5–5.3)
Sodium: 139 mmol/L (ref 135–146)
Total Bilirubin: 0.6 mg/dL (ref 0.2–1.2)
Total Protein: 7 g/dL (ref 6.1–8.1)

## 2019-07-17 LAB — VITAMIN D 25 HYDROXY (VIT D DEFICIENCY, FRACTURES): Vit D, 25-Hydroxy: 91 ng/mL (ref 30–100)

## 2019-07-17 LAB — MAGNESIUM: Magnesium: 2.2 mg/dL (ref 1.5–2.5)

## 2019-07-17 LAB — INSULIN, RANDOM: Insulin: 21.7 u[IU]/mL — ABNORMAL HIGH

## 2019-08-05 ENCOUNTER — Ambulatory Visit (INDEPENDENT_AMBULATORY_CARE_PROVIDER_SITE_OTHER): Payer: Medicare Other

## 2019-08-05 ENCOUNTER — Other Ambulatory Visit: Payer: Self-pay

## 2019-08-05 VITALS — BP 128/74 | HR 65 | Temp 97.7°F | Wt 215.0 lb

## 2019-08-05 DIAGNOSIS — N1832 Chronic kidney disease, stage 3b: Secondary | ICD-10-CM | POA: Diagnosis not present

## 2019-08-05 LAB — COMPLETE METABOLIC PANEL WITH GFR
AG Ratio: 1.7 (calc) (ref 1.0–2.5)
ALT: 21 U/L (ref 9–46)
AST: 23 U/L (ref 10–35)
Albumin: 4.3 g/dL (ref 3.6–5.1)
Alkaline phosphatase (APISO): 55 U/L (ref 35–144)
BUN/Creatinine Ratio: 16 (calc) (ref 6–22)
BUN: 27 mg/dL — ABNORMAL HIGH (ref 7–25)
CO2: 28 mmol/L (ref 20–32)
Calcium: 9.5 mg/dL (ref 8.6–10.3)
Chloride: 103 mmol/L (ref 98–110)
Creat: 1.73 mg/dL — ABNORMAL HIGH (ref 0.70–1.18)
GFR, Est African American: 44 mL/min/{1.73_m2} — ABNORMAL LOW (ref >=60)
GFR, Est Non African American: 38 mL/min/{1.73_m2} — ABNORMAL LOW (ref >=60)
Globulin: 2.5 g/dL (calc) (ref 1.9–3.7)
Glucose, Bld: 90 mg/dL (ref 65–99)
Potassium: 5.1 mmol/L (ref 3.5–5.3)
Sodium: 137 mmol/L (ref 135–146)
Total Bilirubin: 0.5 mg/dL (ref 0.2–1.2)
Total Protein: 6.8 g/dL (ref 6.1–8.1)

## 2019-08-05 NOTE — Addendum Note (Signed)
Addended by: Elsie Amis D on: 08/05/2019 09:20 AM   Modules accepted: Orders

## 2019-08-05 NOTE — Progress Notes (Signed)
Patient reports for NV/LAB visit  & at that time vitals were entered into chart. Patient also reported that he has been drinking 6 bottles of H2o everyday since father's day. Patient had no other concerns/questions at this time. Patient taken to LAB & was informed that office would call with results at a later date.

## 2019-08-06 ENCOUNTER — Other Ambulatory Visit: Payer: Self-pay | Admitting: Internal Medicine

## 2019-08-06 ENCOUNTER — Ambulatory Visit (INDEPENDENT_AMBULATORY_CARE_PROVIDER_SITE_OTHER): Payer: Medicare Other | Admitting: Podiatry

## 2019-08-06 ENCOUNTER — Other Ambulatory Visit: Payer: Self-pay

## 2019-08-06 ENCOUNTER — Encounter: Payer: Self-pay | Admitting: Podiatry

## 2019-08-06 DIAGNOSIS — L600 Ingrowing nail: Secondary | ICD-10-CM

## 2019-08-06 DIAGNOSIS — N1832 Chronic kidney disease, stage 3b: Secondary | ICD-10-CM

## 2019-08-06 MED ORDER — NEOMYCIN-POLYMYXIN-HC 1 % OT SOLN
OTIC | 1 refills | Status: DC
Start: 2019-08-06 — End: 2019-10-16

## 2019-08-06 NOTE — Progress Notes (Signed)
Subjective:  Patient ID: Jeffrey Carey, male    DOB: [birth year],  MRN: [id number] HPI Chief Complaint  Patient presents with  . Toe Pain    Hallux right - lateral border, tender x years intermittently  . New Patient (Initial Visit)    74 y.o. male presents with the above complaint.   ROS: He denies fever chills nausea vomiting muscle aches pains calf pain back pain chest pain shortness of breath.  Past Medical History:  Diagnosis Date  . Chronic kidney disease   . Dyslipidemia   . ED (erectile dysfunction)   . GERD (gastroesophageal reflux disease)   . History of kidney stones   . Hyperlipidemia   . Hypertension   . Radiculopathy 04/30/2014  . Vitamin D deficiency    Past Surgical History:  Procedure Laterality Date  . ANTERIOR CERVICAL DECOMP/DISCECTOMY FUSION N/A 04/30/2014   Procedure: ANTERIOR CERVICAL DECOMPRESSION/DISCECTOMY FUSION 1 LEVEL;  Surgeon: Phylliss Bob, MD;  Location: [facility];  Service: Orthopedics;  Laterality: N/A;  Anterior cervical decompression fusion, cervical 7-thoracic 1 with instrumentation and allograft  . COLONOSCOPY    . HAMMER TOE SURGERY  2012   left foot, 3rd toe  . LAPAROSCOPIC CHOLECYSTECTOMY  10/2010    Current Outpatient Medications:  .  aspirin 81 MG tablet, Take 81 mg by mouth at bedtime. , Disp: , Rfl:  .  atenolol (TENORMIN) 100 MG tablet, TAKE 1 TABLET BY MOUTH DAILY FOR BLOOD PRESSURE, Disp: 90 tablet, Rfl: 3 .  CINNAMON PO, Take 1,000 mg by mouth daily., Disp: , Rfl:  .  Flaxseed, Linseed, (FLAXSEED OIL PO), Take 1 capsule by mouth daily., Disp: , Rfl:  .  Magnesium 250 MG TABS, Take by mouth., Disp: , Rfl:  .  NEOMYCIN-POLYMYXIN-HYDROCORTISONE (CORTISPORIN) 1 % SOLN OTIC solution, Apply 1-2 drops to toe BID after soaking, Disp: 10 mL, Rfl: 1 .  Omega-3 Fatty Acids (FISH OIL PO), Take 1,200 mg by mouth at bedtime. , Disp: , Rfl:  .  pravastatin (PRAVACHOL) 40 MG tablet, Take 1 tablet at Bedtime for Cholesterol, Disp: 90 tablet, Rfl:  3 .  VITAMIN D PO, Take 5,000 Units by mouth daily., Disp: , Rfl:   Allergies  Allergen Reactions  . Ceftin [Cefuroxime Axetil] Hives  . Peanut-Containing Drug Products Other (See Comments)    Break out on nose    Review of Systems Objective:  There were no vitals filed for this visit.  General: Well developed, nourished, in no acute distress, alert and oriented x3   Dermatological: Skin is warm, dry and supple bilateral. Nails x 10 are well maintained; remaining integument appears unremarkable at this time. There are no open sores, no preulcerative lesions, no rash or signs of infection present.  Sharp innervated nail margin fibular border the hallux right with mild erythema no purulence no malodor.  Vascular: Dorsalis Pedis artery and Posterior Tibial artery pedal pulses are 2/4 bilateral with immedate capillary fill time. Pedal hair growth present. No varicosities and no lower extremity edema present bilateral.   Neruologic: Grossly intact via light touch bilateral. Vibratory intact via tuning fork bilateral. Protective threshold with Semmes Wienstein monofilament intact to all pedal sites bilateral. Patellar and Achilles deep tendon reflexes 2+ bilateral. No Babinski or clonus noted bilateral.   Musculoskeletal: No gross boney pedal deformities bilateral. No pain, crepitus, or limitation noted with foot and ankle range of motion bilateral. Muscular strength 5/5 in all groups tested bilateral.  Gait: Unassisted, Nonantalgic.    Radiographs:  None  taken  Assessment & Plan:   Assessment: Ingrown toenail fibular border hallux right  Plan: Discussed etiology pathology conservative versus surgical therapies.  Chemical matricectomy was performed after local anesthetic was administered to the fibular border the hallux right.  He tolerated procedure well without complications.  He received both oral and written home-going instructions for care and soaking of the toe as well as a  prescription for Cortisporin Otic to be applied twice daily after soaking.  I will follow-up with him in 1 to 2 weeks to make sure he is healing well.  Questions or concerns will be directed towards our office.     Rosalia Mcavoy T. [place], [place]

## 2019-08-06 NOTE — Patient Instructions (Signed)

## 2019-08-06 NOTE — Progress Notes (Signed)
========================================================== ° °-   Kidney functions essentially the same - No improvement with better hydration and no obvious explanation for the decline in GFR  (Kidney filtration rate.  So requesting referral for Nephrology consultation tor 2sd opinion to be sure not overlooking anything

## 2019-09-03 ENCOUNTER — Other Ambulatory Visit: Payer: Self-pay

## 2019-09-03 ENCOUNTER — Ambulatory Visit: Payer: Medicare Other | Admitting: Podiatry

## 2019-09-03 VITALS — Temp 97.3°F

## 2019-09-03 DIAGNOSIS — L600 Ingrowing nail: Secondary | ICD-10-CM

## 2019-09-03 NOTE — Progress Notes (Signed)
He presents today for follow-up of his nail avulsion hallux right.  States that the toe is doing great has absolutely no problem whatsoever he is very happy with the outcome.  States he continues to soak regularly.  Objective: Vital signs are stable alert oriented x3.  There is no erythema edema cellulitis drainage or odor.  No signs of infection.  Assessment: Well-healing surgical toe hallux right.  Plan: Follow-up with him on an as-needed basis.

## 2019-09-30 DIAGNOSIS — E559 Vitamin D deficiency, unspecified: Secondary | ICD-10-CM | POA: Diagnosis not present

## 2019-09-30 DIAGNOSIS — I129 Hypertensive chronic kidney disease with stage 1 through stage 4 chronic kidney disease, or unspecified chronic kidney disease: Secondary | ICD-10-CM | POA: Diagnosis not present

## 2019-09-30 DIAGNOSIS — E785 Hyperlipidemia, unspecified: Secondary | ICD-10-CM | POA: Diagnosis not present

## 2019-09-30 DIAGNOSIS — N1832 Chronic kidney disease, stage 3b: Secondary | ICD-10-CM | POA: Diagnosis not present

## 2019-10-08 ENCOUNTER — Other Ambulatory Visit: Payer: Self-pay | Admitting: Internal Medicine

## 2019-10-08 DIAGNOSIS — N1832 Chronic kidney disease, stage 3b: Secondary | ICD-10-CM

## 2019-10-08 DIAGNOSIS — I129 Hypertensive chronic kidney disease with stage 1 through stage 4 chronic kidney disease, or unspecified chronic kidney disease: Secondary | ICD-10-CM

## 2019-10-10 ENCOUNTER — Ambulatory Visit
Admission: RE | Admit: 2019-10-10 | Discharge: 2019-10-10 | Disposition: A | Discharge status: Home / Self Care | Payer: Medicare Other | Source: Ambulatory Visit | Attending: Internal Medicine | Admitting: Internal Medicine

## 2019-10-10 DIAGNOSIS — I129 Hypertensive chronic kidney disease with stage 1 through stage 4 chronic kidney disease, or unspecified chronic kidney disease: Secondary | ICD-10-CM

## 2019-10-10 DIAGNOSIS — N200 Calculus of kidney: Secondary | ICD-10-CM | POA: Diagnosis not present

## 2019-10-10 DIAGNOSIS — N1832 Chronic kidney disease, stage 3b: Secondary | ICD-10-CM

## 2019-10-10 DIAGNOSIS — N189 Chronic kidney disease, unspecified: Secondary | ICD-10-CM | POA: Diagnosis not present

## 2019-10-10 DIAGNOSIS — N281 Cyst of kidney, acquired: Secondary | ICD-10-CM | POA: Diagnosis not present

## 2019-10-15 NOTE — Progress Notes (Signed)
3 MONTH FOLLOW UP Assessment:    Essential hypertension Persistently above goal; discussed and start low dose losartan 25 mg  Monitor blood pressure at home; call if consistently over 130/80 Continue DASH diet.   Reminder to go to the ER if any CP, SOB, nausea, dizziness, severe HA, changes vision/speech, left arm numbness and tingling and jaw pain.  Gastroesophageal reflux disease, esophagitis presence not specified Well managed on current medications, PPI PRN <2/week Discussed diet, avoiding triggers and other lifestyle changes  Vitamin D deficiency At goal at recent check; continue to recommend supplementation for goal of 70-100 Defer vitamin D level  Other abnormal glucose Recent A1Cs at goal Discussed diet/exercise, weight management  Defer A1C; check CMP  Obesity (BMI 30.0-34.9) Long discussion about weight loss, diet, and exercise Recommended diet heavy in fruits and veggies and low in animal meats, cheeses, and dairy products, appropriate calorie intake Patient will work on cutting sugar, watching portions, increase walking Discussed appropriate weight for height and initial goal (210lb) Follow up at next visit  Medication management CBC, CMP/GFR, magnesium  Hyperlipidemia Continue medications: pravastatin 40 mg daily Continue low cholesterol diet and exercise.  Check lipid panel.   CKD (chronic kidney disease) stage 3b, GFR 30-44 ml/min (HCC) Increase fluids, avoid NSAIDS, monitor sugars, will monitor Dr. Joylene Grapes following Add losartan for renal protection and more aggressive BP control  Erectile dysfunction Sildenafil PRN works well, uses rarely   Need for influenza vaccine High dose quadrivalent administered without complication today     Over 30 minutes of exam, counseling, chart review, and critical decision making was performed  Future Appointments  Date Time Provider [place]  11/16/2019 11:00 AM [facility]   02/03/2020 11:00 AM [name], MD [facility] None  05/04/2020 10:00 AM [name], NP [facility] None     Subjective:  Jeffrey Carey is a 74 y.o. male who presents for 3 month follow up for HTN, hyperlipidemia, glucose management, and vitamin D Def.   His wife was diagnosed with breast cancer in 2020, finished chemo, doing better and working on regaining strength.   He takes sildenafil 20 mg PRN for ED.   he has a diagnosis of GERD which is currently managed by omeprazole 20 mg PRN, <2/week, reports well controlled.   BMI is Body mass index is 30.99 kg/m., he has been working on diet and exercise, walking 2-3 miles every other day, has cut back on snacking. He does weigh himself regularly.  Hamburger chop steak, chicken Wt Readings from Last 3 Encounters:  10/16/19 216 lb (98 kg)  08/05/19 (!) 215 lb (97.5 kg)  07/15/19 216 lb 3.2 oz (98.1 kg)   His blood pressure has been controlled at home (130s/low 80s), today their BP is BP: 136/84 He does workout. He denies chest pain, shortness of breath, dizziness.   He is on cholesterol medication (pravastatin 40 mg daily) and denies myalgias. His cholesterol is at goal. The cholesterol last visit was:   Lab Results  Component Value Date   CHOL 128 07/15/2019   HDL 35 (L) 07/15/2019   LDLCALC 73 07/15/2019   TRIG 114 07/15/2019   CHOLHDL 3.7 07/15/2019   He has been working on diet and exercise for history of prediabetes (last elevation 5.7% in 2017), does have persistent insulin resistance (21.7 in 07/2019) and denies increased appetite, nausea, paresthesia of the feet, polydipsia, polyuria, visual disturbances, vomiting and weight loss. Last A1C in the office was:  Lab Results  Component Value Date   HGBA1C 5.3 07/15/2019   He has CKD IIIb without albuminuria, following nephrology Dr. Peeples q50m, getting 3-4 bottles:   Lab Results  Component Value Date   GFRNONAA 38 (L) 08/05/2019    Patient is on Vitamin D  supplement.   Lab Results  Component Value Date   VD25OH 91 07/15/2019      Medication Review:   Current Outpatient Medications (Cardiovascular):    atenolol (TENORMIN) 100 MG tablet, TAKE 1 TABLET BY MOUTH DAILY FOR BLOOD PRESSURE   pravastatin (PRAVACHOL) 40 MG tablet, Take 1 tablet at Bedtime for Cholesterol   Current Outpatient Medications (Analgesics):    aspirin 81 MG tablet, Take 81 mg by mouth at bedtime.    Current Outpatient Medications (Other):    CINNAMON PO, Take 1,000 mg by mouth daily.   Flaxseed, Linseed, (FLAXSEED OIL PO), Take 1 capsule by mouth daily.   Magnesium 250 MG TABS, Take by mouth.   Omega-3 Fatty Acids (FISH OIL PO), Take 1,200 mg by mouth at bedtime.    VITAMIN D PO, Take 5,000 Units by mouth daily.   NEOMYCIN-POLYMYXIN-HYDROCORTISONE (CORTISPORIN) 1 % SOLN OTIC solution, Apply 1-2 drops to toe BID after soaking  Allergies: Allergies  Allergen Reactions   Ceftin [Cefuroxime Axetil] Hives   Peanut-Containing Drug Products Other (See Comments)    Break out on nose     Current Problems (verified) has History of colonic polyps; Essential hypertension; Hyperlipidemia, mixed; Abnormal glucose; Vitamin D deficiency; Gastroesophageal reflux disease; Obesity (BMI 30.0-34.9); CKD (chronic kidney disease) stage 3, GFR 30-59 ml/min ([facility]); History of prediabetes; and ED (erectile dysfunction) on their problem list.  Surgical: He  has a past surgical history that includes Laparoscopic cholecystectomy (10/2010); Hammer toe surgery (2012); Anterior cervical decomp/discectomy fusion (N/A, 04/30/2014); and Colonoscopy. Family His family history includes Alzheimer's disease in his father; Colon cancer (age of onset: 52) in his father and mother; Diabetes in his father; Ovarian cancer in his mother; Prostate cancer in his father. Social history  He reports that he has never smoked. He has never used smokeless tobacco. He reports that he does not drink  alcohol and does not use drugs.   Review of Systems  Constitutional: Negative for malaise/fatigue and weight loss.  HENT: Negative for hearing loss and tinnitus.   Eyes: Negative for blurred vision and double vision.  Respiratory: Negative for cough, shortness of breath and wheezing.   Cardiovascular: Negative for chest pain, palpitations, orthopnea, claudication and leg swelling.  Gastrointestinal: Negative for abdominal pain, blood in stool, constipation, diarrhea, heartburn, melena, nausea and vomiting.  Genitourinary: Negative.   Musculoskeletal: Negative for joint pain and myalgias.  Skin: Negative for rash.  Neurological: Negative for dizziness, tingling, sensory change, weakness and headaches.  Endo/Heme/Allergies: Negative for polydipsia.  Psychiatric/Behavioral: Negative.   All other systems reviewed and are negative.    Objective:   Today's Vitals   10/16/19 0904  BP: 136/84  Pulse: (!) 52  Temp: 97.7 F (36.5 C)  SpO2: 95%  Weight: 216 lb (98 kg)  Height: 5\' 10"  (1.778 m)   Body mass index is 30.99 kg/m.  General appearance: alert, no distress, WD/WN, male HEENT: normocephalic, sclerae anicteric, TMs pearly, nares patent, no discharge or erythema, pharynx normal Oral cavity: MMM, no lesions Neck: supple, no lymphadenopathy, no thyromegaly, no masses Heart: RRR, normal S1, S2, no murmurs Lungs: CTA bilaterally, no wheezes, rhonchi, or rales Abdomen: +bs, soft, non tender, non distended, no masses, no hepatomegaly, no  splenomegaly Musculoskeletal: nontender, no swelling, no obvious deformity. Neg straight leg raise. No spinous tenderness, SI joint tenderness. Lumbar pain indicated over paraspinals.  Extremities: no edema, no cyanosis, no clubbing Pulses: 2+ symmetric, upper and lower extremities, normal cap refill Neurological: alert, oriented x 3, CN2-12 intact, strength normal upper extremities and lower extremities, sensation normal throughout, DTRs 2+  throughout, no cerebellar signs, gait normal Psychiatric: normal affect, behavior normal, pleasant     Izora Ribas, NP   10/16/2019

## 2019-10-16 ENCOUNTER — Encounter: Payer: Self-pay | Admitting: Adult Health

## 2019-10-16 ENCOUNTER — Ambulatory Visit (INDEPENDENT_AMBULATORY_CARE_PROVIDER_SITE_OTHER): Payer: Medicare Other | Admitting: Adult Health

## 2019-10-16 ENCOUNTER — Other Ambulatory Visit: Payer: Self-pay

## 2019-10-16 VITALS — BP 136/84 | HR 52 | Temp 97.7°F | Ht 70.0 in | Wt 216.0 lb

## 2019-10-16 DIAGNOSIS — E782 Mixed hyperlipidemia: Secondary | ICD-10-CM

## 2019-10-16 DIAGNOSIS — Z23 Encounter for immunization: Secondary | ICD-10-CM | POA: Diagnosis not present

## 2019-10-16 DIAGNOSIS — Z87898 Personal history of other specified conditions: Secondary | ICD-10-CM | POA: Diagnosis not present

## 2019-10-16 DIAGNOSIS — I10 Essential (primary) hypertension: Secondary | ICD-10-CM | POA: Diagnosis not present

## 2019-10-16 DIAGNOSIS — R7309 Other abnormal glucose: Secondary | ICD-10-CM

## 2019-10-16 DIAGNOSIS — E669 Obesity, unspecified: Secondary | ICD-10-CM

## 2019-10-16 DIAGNOSIS — N1832 Chronic kidney disease, stage 3b: Secondary | ICD-10-CM

## 2019-10-16 DIAGNOSIS — D751 Secondary polycythemia: Secondary | ICD-10-CM

## 2019-10-16 DIAGNOSIS — E559 Vitamin D deficiency, unspecified: Secondary | ICD-10-CM

## 2019-10-16 MED ORDER — LOSARTAN POTASSIUM 25 MG PO TABS: 25.0000 mg | ORAL_TABLET | Freq: Every day | ORAL | 3 refills | Status: DC

## 2019-10-16 NOTE — Addendum Note (Signed)
Addended by: Chancy Hurter on: 10/16/2019 09:52 AM   Modules accepted: Orders

## 2019-10-16 NOTE — Patient Instructions (Addendum)
Try splitting entree and add high fiber side instead - salad, whole grain, beans, veggie, slaw, etc  Try to reduce processed carb - pasta, low fiber breads - spike sugar/insulin and trigger fat storage - try to do smaller portions and pair with high fiber, protein to slow down absorption   Beans 1/2 cup 6-15 g fiber - aim for at least 4 servings in a week  Choose whole grain   Dave's killer bread is great - any product with high fiber    High-Fiber Diet Fiber, also called dietary fiber, is a type of carbohydrate that is found in fruits, vegetables, whole grains, and beans. A high-fiber diet can have many health benefits. Your health care provider may recommend a high-fiber diet to help:  Prevent constipation. Fiber can make your bowel movements more regular.  Lower your cholesterol.  Relieve the following conditions: ? Swelling of veins in the anus (hemorrhoids). ? Swelling and irritation (inflammation) of specific areas of the digestive tract (uncomplicated diverticulosis). ? A problem of the large intestine (colon) that sometimes causes pain and diarrhea (irritable bowel syndrome, IBS).  Prevent overeating as part of a weight-loss plan.  Prevent heart disease, type 2 diabetes, and certain cancers. What is my plan? The recommended daily fiber intake in grams (g) includes:  38 g for men age 43 or younger.  30 g for men over age 45.  85 g for women age 53 or younger.  21 g for women over age 18. You can get the recommended daily intake of dietary fiber by:  Eating a variety of fruits, vegetables, grains, and beans.  Taking a fiber supplement, if it is not possible to get enough fiber through your diet. What do I need to know about a high-fiber diet?  It is better to get fiber through food sources rather than from fiber supplements. There is not a lot of research about how effective supplements are.  Always check the fiber content on the nutrition facts label of any  prepackaged food. Look for foods that contain 5 g of fiber or more per serving.  Talk with a diet and nutrition specialist (dietitian) if you have questions about specific foods that are recommended or not recommended for your medical condition, especially if those foods are not listed below.  Gradually increase how much fiber you consume. If you increase your intake of dietary fiber too quickly, you may have bloating, cramping, or gas.  Drink plenty of water. Water helps you to digest fiber. What are tips for following this plan?  Eat a wide variety of high-fiber foods.  Make sure that half of the grains that you eat each day are whole grains.  Eat breads and cereals that are made with whole-grain flour instead of refined flour or white flour.  Eat brown rice, bulgur wheat, or millet instead of white rice.  Start the day with a breakfast that is high in fiber, such as a cereal that contains 5 g of fiber or more per serving.  Use beans in place of meat in soups, salads, and pasta dishes.  Eat high-fiber snacks, such as berries, raw vegetables, nuts, and popcorn.  Choose whole fruits and vegetables instead of processed forms like juice or sauce. What foods can I eat?  Fruits Berries. Pears. Apples. Oranges. Avocado. Prunes and raisins. Dried figs. Vegetables Sweet potatoes. Spinach. Kale. Artichokes. Cabbage. Broccoli. Cauliflower. Green peas. Carrots. Squash. Grains Whole-grain breads. Multigrain cereal. Oats and oatmeal. Brown rice. Barley. Bulgur wheat. [place].  Quinoa. Bran muffins. Popcorn. Rye wafer crackers. Meats and other proteins Navy, kidney, and pinto beans. Soybeans. Split peas. Lentils. Nuts and seeds. Dairy Fiber-fortified yogurt. Beverages Fiber-fortified soy milk. Fiber-fortified orange juice. Other foods Fiber bars. The items listed above may not be a complete list of recommended foods and beverages. Contact a dietitian for more options. What foods are not  recommended? Fruits Fruit juice. Cooked, strained fruit. Vegetables Fried potatoes. Canned vegetables. Well-cooked vegetables. Grains White bread. Pasta made with refined flour. White rice. Meats and other proteins Fatty cuts of meat. Fried chicken or fried fish. Dairy Milk. Yogurt. Cream cheese. Sour cream. Fats and oils Butters. Beverages Soft drinks. Other foods Cakes and pastries. The items listed above may not be a complete list of foods and beverages to avoid. Contact a dietitian for more information. Summary  Fiber is a type of carbohydrate. It is found in fruits, vegetables, whole grains, and beans.  There are many health benefits of eating a high-fiber diet, such as preventing constipation, lowering blood cholesterol, helping with weight loss, and reducing your risk of heart disease, diabetes, and certain cancers.  Gradually increase your intake of fiber. Increasing too fast can result in cramping, bloating, and gas. Drink plenty of water while you increase your fiber.  The best sources of fiber include whole fruits and vegetables, whole grains, nuts, seeds, and beans. This information is not intended to replace advice given to you by your health care provider. Make sure you discuss any questions you have with your health care provider. Document Revised: 10/24/2016 Document Reviewed: 10/24/2016 Elsevier Patient Education  2020 [employer].

## 2019-10-17 NOTE — Addendum Note (Signed)
Addended by: Izora Ribas on: 10/17/2019 08:52 AM   Modules accepted: Orders

## 2019-10-18 ENCOUNTER — Other Ambulatory Visit: Payer: Self-pay | Admitting: Adult Health

## 2019-10-18 DIAGNOSIS — D751 Secondary polycythemia: Secondary | ICD-10-CM | POA: Insufficient documentation

## 2019-10-18 DIAGNOSIS — N1832 Chronic kidney disease, stage 3b: Secondary | ICD-10-CM

## 2019-10-18 DIAGNOSIS — E875 Hyperkalemia: Secondary | ICD-10-CM

## 2019-10-18 LAB — CBC WITH DIFFERENTIAL/PLATELET
Absolute Monocytes: 490 cells/uL (ref 200–950)
Basophils Absolute: 50 cells/uL (ref 0–200)
Basophils Relative: 0.7 %
Eosinophils Absolute: 223 cells/uL (ref 15–500)
Eosinophils Relative: 3.1 %
HCT: 53.6 % — ABNORMAL HIGH (ref 38.5–50.0)
Hemoglobin: 17.5 g/dL — ABNORMAL HIGH (ref 13.2–17.1)
Lymphs Abs: 1418 cells/uL (ref 850–3900)
MCH: 28.8 pg (ref 27.0–33.0)
MCHC: 32.6 g/dL (ref 32.0–36.0)
MCV: 88.3 fL (ref 80.0–100.0)
MPV: 9.1 fL (ref 7.5–12.5)
Monocytes Relative: 6.8 %
Neutro Abs: 5018 cells/uL (ref 1500–7800)
Neutrophils Relative %: 69.7 %
Platelets: 287 10*3/uL (ref 140–400)
RBC: 6.07 10*6/uL — ABNORMAL HIGH (ref 4.20–5.80)
RDW: 13.1 % (ref 11.0–15.0)
Total Lymphocyte: 19.7 %
WBC: 7.2 10*3/uL (ref 3.8–10.8)

## 2019-10-18 LAB — PATHOLOGIST SMEAR REVIEW

## 2019-10-18 LAB — COMPLETE METABOLIC PANEL WITH GFR
AG Ratio: 1.7 (calc) (ref 1.0–2.5)
ALT: 20 U/L (ref 9–46)
AST: 20 U/L (ref 10–35)
Albumin: 4.4 g/dL (ref 3.6–5.1)
Alkaline phosphatase (APISO): 63 U/L (ref 35–144)
BUN/Creatinine Ratio: 14 (calc) (ref 6–22)
BUN: 24 mg/dL (ref 7–25)
CO2: 30 mmol/L (ref 20–32)
Calcium: 9.7 mg/dL (ref 8.6–10.3)
Chloride: 104 mmol/L (ref 98–110)
Creat: 1.71 mg/dL — ABNORMAL HIGH (ref 0.70–1.18)
GFR, Est African American: 45 mL/min/{1.73_m2} — ABNORMAL LOW (ref >=60)
GFR, Est Non African American: 39 mL/min/{1.73_m2} — ABNORMAL LOW (ref >=60)
Globulin: 2.6 g/dL (calc) (ref 1.9–3.7)
Glucose, Bld: 97 mg/dL (ref 65–99)
Potassium: 5.9 mmol/L — ABNORMAL HIGH (ref 3.5–5.3)
Sodium: 140 mmol/L (ref 135–146)
Total Bilirubin: 0.7 mg/dL (ref 0.2–1.2)
Total Protein: 7 g/dL (ref 6.1–8.1)

## 2019-10-18 LAB — LIPID PANEL
Cholesterol: 121 mg/dL (ref <200)
HDL: 35 mg/dL — ABNORMAL LOW (ref >=40)
LDL Cholesterol (Calc): 68 mg/dL (calc)
Non-HDL Cholesterol (Calc): 86 mg/dL (calc) (ref <130)
Total CHOL/HDL Ratio: 3.5 (calc) (ref <5.0)
Triglycerides: 101 mg/dL (ref <150)

## 2019-10-18 LAB — TEST AUTHORIZATION

## 2019-10-18 LAB — MAGNESIUM: Magnesium: 2.2 mg/dL (ref 1.5–2.5)

## 2019-10-18 LAB — TSH: TSH: 2.78 mIU/L (ref 0.40–4.50)

## 2019-11-01 DIAGNOSIS — N1832 Chronic kidney disease, stage 3b: Secondary | ICD-10-CM | POA: Diagnosis not present

## 2019-11-10 ENCOUNTER — Other Ambulatory Visit: Payer: Self-pay | Admitting: Internal Medicine

## 2019-11-10 DIAGNOSIS — E782 Mixed hyperlipidemia: Secondary | ICD-10-CM

## 2019-11-16 ENCOUNTER — Other Ambulatory Visit: Payer: Self-pay

## 2019-11-16 ENCOUNTER — Ambulatory Visit: Payer: Medicare Other | Attending: Internal Medicine

## 2019-11-16 DIAGNOSIS — Z23 Encounter for immunization: Secondary | ICD-10-CM

## 2019-11-16 NOTE — Progress Notes (Signed)
   Covid-19 Vaccination [facility]  Name:  Jeffrey Carey    MRN: [id number] DOB: February 12, 1945  11/16/2019  [name] was observed post Covid-19 immunization for 15 minutes without incident. He was provided with Vaccine Information Sheet and instruction to access the V-Safe system.   [name] was instructed to call 911 with any severe reactions post vaccine: Marland Kitchen Difficulty breathing  . Swelling of face and throat  . A fast heartbeat  . A bad rash all over body  . Dizziness and weakness   Immunizations Administered    Name Date Dose VIS Date Route   Pfizer COVID-19 Vaccine 11/16/2019 11:10 AM 0.3 mL 10/23/2019 Intramuscular   Manufacturer: [place]   Lot: [id number]   [place]: [address]-9

## 2019-11-21 ENCOUNTER — Encounter: Payer: Self-pay | Admitting: Adult Health

## 2019-11-21 ENCOUNTER — Other Ambulatory Visit: Payer: Self-pay

## 2019-11-21 ENCOUNTER — Ambulatory Visit (INDEPENDENT_AMBULATORY_CARE_PROVIDER_SITE_OTHER): Payer: Medicare Other | Admitting: Adult Health

## 2019-11-21 VITALS — BP 134/78 | HR 57 | Temp 97.2°F | Wt 219.0 lb

## 2019-11-21 DIAGNOSIS — H1032 Unspecified acute conjunctivitis, left eye: Secondary | ICD-10-CM

## 2019-11-21 MED ORDER — POLYMYXIN B-TRIMETHOPRIM 10000-0.1 UNIT/ML-% OP SOLN: 1.0000 [drp] | OPHTHALMIC | 0 refills | Status: DC

## 2019-11-21 NOTE — Patient Instructions (Addendum)
Bacterial Conjunctivitis, Adult Bacterial conjunctivitis is an infection of the clear membrane that covers the white part of your eye and the inner surface of your eyelid (conjunctiva). When the blood vessels in your conjunctiva become inflamed, your eye becomes red or pink, and it will probably feel itchy. Bacterial conjunctivitis spreads very easily from person to person (is contagious). It also spreads easily from one eye to the other eye. What are the causes? This condition is caused by bacteria. You may get the infection if you come into close contact with:  A person who is infected with the bacteria.  Items that are contaminated with the bacteria, such as a face towel, contact lens solution, or eye makeup. What increases the risk? You are more likely to develop this condition if you:  Are exposed to other people who have the infection.  Wear contact lenses.  Have a sinus infection.  Have had a recent eye injury or surgery.  Have a weak body defense system (immune system).  Have a medical condition that causes dry eyes. What are the signs or symptoms? Symptoms of this condition include:  Thick, yellowish discharge from the eye. This may turn into a crust on the eyelid overnight and cause your eyelids to stick together.  Tearing or watery eyes.  Itchy eyes.  Burning feeling in your eyes.  Eye redness.  Swollen eyelids.  Blurred vision. How is this diagnosed? This condition is diagnosed based on your symptoms and medical history. Your health care provider may also take a sample of discharge from your eye to find the cause of your infection. This is rarely done. How is this treated? This condition may be treated with:  Antibiotic eye drops or ointment to clear the infection more quickly and prevent the spread of infection to others.  Oral antibiotic medicines to treat infections that do not respond to drops or ointments or that last longer than 10 days.  Cool, wet  cloths (cool compresses) placed on the eyes.  Artificial tears applied 2-6 times a day. Follow these instructions at home: Medicines  Take or apply your antibiotic medicine as told by your health care provider. Do not stop taking or applying the antibiotic even if you start to feel better.  Take or apply over-the-counter and prescription medicines only as told by your health care provider.  Be very careful to avoid touching the edge of your eyelid with the eye-drop bottle or the ointment tube when you apply medicines to the affected eye. This will keep you from spreading the infection to your other eye or to other people. Managing discomfort  Gently wipe away any drainage from your eye with a warm, wet washcloth or a cotton ball.  Apply a clean, cool compress to your eye for 10-20 minutes, 3-4 times a day. General instructions  Do not wear contact lenses until the inflammation is gone and your health care provider says it is safe to wear them again. Ask your health care provider how to sterilize or replace your contact lenses before you use them again. Wear glasses until you can resume wearing contact lenses.  Avoid wearing eye makeup until the inflammation is gone. Throw away any old eye cosmetics that may be contaminated.  Change or wash your pillowcase every day.  Do not share towels or washcloths. This may spread the infection.  Wash your hands often with soap and water. Use paper towels to dry your hands.  Avoid touching or rubbing your eyes.  Do not   drive or use heavy machinery if your vision is blurred. Contact a health care provider if:  You have a fever.  Your symptoms do not get better after 10 days. Get help right away if you have:  A fever and your symptoms suddenly get worse.  Severe pain when you move your eye.  Facial pain, redness, or swelling.  Sudden loss of vision. Summary  Bacterial conjunctivitis is an infection of the clear membrane that covers the  white part of your eye and the inner surface of your eyelid (conjunctiva).  Bacterial conjunctivitis spreads very easily from person to person (is contagious).  Wash your hands often with soap and water. Use paper towels to dry your hands.  Take or apply your antibiotic medicine as told by your health care provider. Do not stop taking or applying the antibiotic even if you start to feel better.  Contact a health care provider if you have a fever or your symptoms do not get better after 10 days. This information is not intended to replace advice given to you by your health care provider. Make sure you discuss any questions you have with your health care provider. Document Revised: 04/10/2018 Document Reviewed: 07/26/2017 Elsevier Patient Education  [place].     Polymyxin B; Trimethoprim eye drops, solution What is this medicine? POLYMYXIN B and TRIMETHOPRIM (pol i MIX in B and trye METH oh prim) eye drops treat certain eye infections caused by bacteria. This medicine may be used for other purposes; ask your health care provider or pharmacist if you have questions. COMMON BRAND NAME(S): Polytrim What should I tell my health care provider before I take this medicine? They need to know if you have any of these conditions:  wear contact lenses  an unusual or allergic reaction to polymyxin B, trimethoprim, other medicines, foods, dyes, or preservatives  pregnant or trying to get pregnant  breast-feeding How should I use this medicine? This medicine is used in the eye. Follow the directions on the prescription label. Wash your hands before and after use. Tilt your head back slightly. Pull your lower eyelid down gently to form a pouch. Do not touch the tip of the dropper to your eye, fingertips, or other surface. Squeeze the prescribed number of drops into the pouch. Close the eye gently to spread the drops. Use your medicine at regular intervals. Do not take your medicine more  often than directed. Use all of your medicine as directed even if you think your are better. Do not skip doses or stop your medicine early. Talk to your pediatrician regarding the use of this medicine in children. While this drug may be prescribed for children and infants for selected conditions, precautions do apply. Overdosage: If you think you have taken too much of this medicine contact a poison control center or emergency room at once. NOTE: This medicine is only for you. Do not share this medicine with others. What if I miss a dose? If you miss a dose, use it as soon as you can. If it is almost time for your next dose, use only that dose. Do not use double or extra doses. What may interact with this medicine? Interactions are not expected. Do not use any other eye products without advice of your doctor or health care professional. This list may not describe all possible interactions. Give your health care provider a list of all the medicines, herbs, non-prescription drugs, or dietary supplements you use. Also tell them if you  smoke, drink alcohol, or use illegal drugs. Some items may interact with your medicine. What should I watch for while using this medicine? Check with your doctor or health care professional if your condition does not get better after 5 days, or if it gets worse. If you wear contact lenses, ask when you can use your lenses again. A burning or stinging reaction that does not go away may mean you are allergic to this product. Stop use and call your doctor or health care professional. To prevent the spread of infection, do not share eye products or other personal items with anyone else. What side effects may I notice from receiving this medicine? Side effects that you should report to your doctor or health care professional as soon as possible:  burning, stinging, or swelling  change in vision or blurred vision that will not go away  eye pain  itching and  redness  rash Side effects that usually do not require medical attention (report to your doctor or health care professional if they continue or are bothersome):  temporary blurred vision after applying  temporary watering or stinging This list may not describe all possible side effects. Call your doctor for medical advice about side effects. You may report side effects to FDA at 1-800-FDA-1088. Where should I keep my medicine? Keep out of the reach of children. Store at room temperature 15 to 25 degrees C (59 to 77 degrees F). Protect from light. To prevent the spread of infection, it is best to throw away any unused eye drops after you finish the course of treatment. Throw away any unused medicine after the expiration date. NOTE: This sheet is a summary. It may not cover all possible information. If you have questions about this medicine, talk to your doctor, pharmacist, or health care provider.  2020 Elsevier/Gold Standard (2007-07-24 11:36:42)

## 2019-11-21 NOTE — Progress Notes (Signed)
Assessment and Plan:  Jeffrey Carey was seen today for eye problem.  Diagnoses and all orders for this visit:  Acute bacterial conjunctivitis of left eye  Discussed the diagnosis and proper care of conjunctivitis.  Stressed household Nurse, mental health. Ophthalmic drops per orders. Local eye care discussed.  Stop retasis, can do plain unmedicated hydrating drops at least 30 min apart from abx drops if needed Hygiene explained, if symptoms increase Eye Doctor ASAP -     trimethoprim-polymyxin b (POLYTRIM) ophthalmic solution; Place 1 drop into both eyes every 4 (four) hours. For 10 days. Dispense quantity sufficient.  Further disposition pending results of labs. Discussed med's effects and SE's.   Over 15 minutes of exam, counseling, chart review, and critical decision making was performed.   Future Appointments  Date Time Provider [place]  02/03/2020 11:00 AM [name], MD [facility] None  05/04/2020 10:00 AM [name], NP [facility] None    ------------------------------------------------------------------------------------------------------------------   HPI BP 134/78   Pulse (!) 57   Temp (!) 97.2 F (36.2 C)   Wt 219 lb (99.3 kg)   SpO2 96%   BMI 31.42 kg/m   74 y.o.male presents for evaluation of possible pink eye.   Woke up yesterday with L eye blood red, burning, crusted closed (yellow, AM only),  Normal vision, no pain, mildly gritty but otherwise denies foreign body sensation. Today also noting mild redness of R eye as well and some crusting. Has been doing restasis eye drops. Notes he was raking leaves for several hours the day prior.   Denies any other sx; HA, blurry vision, dizziness, congestion/URI sx, sneezing, watery/itchy eyes, fever/chills, pressure in eye  Past Medical History:  Diagnosis Date  . Chronic kidney disease   . Dyslipidemia   . ED (erectile dysfunction)   . GERD (gastroesophageal reflux disease)   . History of kidney stones   .  Hyperlipidemia   . Hypertension   . Radiculopathy 04/30/2014  . Vitamin D deficiency      Allergies  Allergen Reactions  . Ceftin [Cefuroxime Axetil] Hives  . Peanut-Containing Drug Products Other (See Comments)    Break out on nose     Current Outpatient Medications on File Prior to Visit  Medication Sig  . aspirin 81 MG tablet Take 81 mg by mouth at bedtime.   Marland Kitchen atenolol (TENORMIN) 100 MG tablet TAKE 1 TABLET BY MOUTH DAILY FOR BLOOD PRESSURE  . CINNAMON PO Take 1,000 mg by mouth daily.  . Flaxseed, Linseed, (FLAXSEED OIL PO) Take 1 capsule by mouth daily.  Marland Kitchen losartan (COZAAR) 25 MG tablet Take 1 tablet (25 mg total) by mouth daily. For blood pressure and kidney protection.  . Magnesium 250 MG TABS Take by mouth.  . Omega-3 Fatty Acids (FISH OIL PO) Take 1,200 mg by mouth at bedtime.   . pravastatin (PRAVACHOL) 40 MG tablet Take      1 tablet       at Bedtime        for Cholesterol  . VITAMIN D PO Take 5,000 Units by mouth daily.   No current facility-administered medications on file prior to visit.    ROS: all negative except above.   Physical Exam:  BP 134/78   Pulse (!) 57   Temp (!) 97.2 F (36.2 C)   Wt 219 lb (99.3 kg)   SpO2 96%   BMI 31.42 kg/m   General Appearance: Well nourished, in no apparent distress. Eyes: PERRLA, L conjunctiva moderately erythematous, R mildly erythematous.  Lids are non-inflamed. Scant purulent, crusty discharge of L to inner corner. No foreign bodies noted.  Sinuses: No Frontal/maxillary tenderness ENT/Mouth: Ext aud canals clear, TMs without erythema, bulging. No erythema, swelling, or exudate on post pharynx.  Tonsils not swollen or erythematous. Hearing normal.  Neck: Supple Respiratory: Respiratory effort normal, BS equal bilaterally without rales, rhonchi, wheezing or stridor.  Cardio: RRR with no MRGs Lymphatics: Non tender without lymphadenopathy.  Musculoskeletal: normal gait.  Skin: Warm, dry without rashes, lesions,  ecchymosis.  Neuro: Normal muscle tone Psych: Awake and oriented X 3, normal affect, Insight and Judgment appropriate.     Izora Ribas, NP 11:28 AM [facility]

## 2019-12-04 ENCOUNTER — Encounter: Payer: Self-pay | Admitting: Internal Medicine

## 2019-12-04 ENCOUNTER — Ambulatory Visit (INDEPENDENT_AMBULATORY_CARE_PROVIDER_SITE_OTHER): Payer: Medicare Other | Admitting: Internal Medicine

## 2019-12-04 ENCOUNTER — Other Ambulatory Visit: Payer: Self-pay

## 2019-12-04 VITALS — BP 118/78 | HR 54 | Temp 97.5°F | Ht 70.0 in | Wt 219.0 lb

## 2019-12-04 DIAGNOSIS — S300XXA Contusion of lower back and pelvis, initial encounter: Secondary | ICD-10-CM | POA: Diagnosis not present

## 2019-12-04 MED ORDER — CYCLOBENZAPRINE HCL 10 MG PO TABS: ORAL_TABLET | ORAL | 0 refills | Status: DC

## 2019-12-04 MED ORDER — DEXAMETHASONE 4 MG PO TABS: ORAL_TABLET | ORAL | 0 refills | Status: DC

## 2019-12-04 NOTE — Progress Notes (Signed)
   History of Present Illness:     % days ago was "stringing" Xmas lights in his front yard and was on 4th rung of a ladder and it collapsed & he fell on his right side. Has had pain /discomfort over his  Rt buttock with recumbancy at night.  Denies difficulty with gait. Denies any injury above the waist  & specifically no head injury or LOC.   Medications  .  atenolol (TENORMIN) 100 MG tablet, TAKE 1 TABLET BY MOUTH DAILY FOR BLOOD PRESSURE .  losartan (COZAAR) 25 MG tablet, Take 1 tablet (25 mg total) by mouth daily. For blood pressure and kidney protection. .  pravastatin (PRAVACHOL) 40 MG tablet, Take      1 tablet       at Bedtime        for Cholesterol .  aspirin 81 MG tablet, Take 81 mg by mouth at bedtime.  Marland Kitchen  CINNAMON PO, Take 1,000 mg by mouth daily. .  Flaxseed, Linseed, (FLAXSEED OIL PO), Take 1 capsule by mouth daily. .  Magnesium 250 MG TABS, Take by mouth. .  Omega-3 Fatty Acids (FISH OIL PO), Take 1,200 mg by mouth at bedtime.  Marland Kitchen  trimethoprim-polymyxin b (POLYTRIM) ophthalmic solution, Place 1 drop into both eyes every 4 (four) hours. For 10 days. Dispense quantity sufficient. Marland Kitchen  VITAMIN D PO, Take 5,000 Units by mouth daily.  Problem list He has History of colonic polyps; Essential hypertension; Hyperlipidemia, mixed; Abnormal glucose; Vitamin D deficiency; Gastroesophageal reflux disease; Obesity (BMI 30.0-34.9); CKD (chronic kidney disease) stage 3, GFR 30-59 ml/min ([place]); History of prediabetes; ED (erectile dysfunction); and Polycythemia on their problem list.   Observations/Objective:  BP 118/78   Pulse (!) 54   Temp (!) 97.5 F (36.4 C)   Ht 5\' 10"  (1.778 m)   Wt 219 lb (99.3 kg)   SpO2 96%   BMI 31.42 kg/m   HEENT - WNL. Neck - supple.  Chest - Clear equal BS. Cor - Nl HS. RRR w/o sig MGR. PP 1(+). No edema. MS- FROM of both hips & knees. w/o deformities.  Gait Nl. Tender over Rr buttock posterior to Hip bursa. Neuro -  Nl w/o focal  abnormalities.  Assessment and Plan:  1. Contusion, buttock, initial encounter  - dexamethasone  4 MG tablet; Take 1 tab      3 x day      for 3 days,     then 2 x day      for 3 days,     then 1 tab daily  Dispense: 20 tablet  - cyclobenzaprine   10 MG tablet; Take      1/2 to 1 tablet       3 x /day        as needed       for Muscle Spasm  Dispense: 60 tablet; Refill: 0   Follow Up Instructions:       I discussed the assessment and treatment plan with the patient. The patient was provided an opportunity to ask questions and all were answered. The patient agreed with the plan and demonstrated an understanding of the instructions. Discussed meds & SE's.      The patient was advised to call back or seek an in-person evaluation if the symptoms worsen or if the condition fails to improve as anticipated.   Kirtland Bouchard, MD

## 2019-12-10 DIAGNOSIS — H2513 Age-related nuclear cataract, bilateral: Secondary | ICD-10-CM | POA: Diagnosis not present

## 2019-12-10 DIAGNOSIS — H16223 Keratoconjunctivitis sicca, not specified as Sjogren's, bilateral: Secondary | ICD-10-CM | POA: Diagnosis not present

## 2019-12-10 DIAGNOSIS — H02831 Dermatochalasis of right upper eyelid: Secondary | ICD-10-CM | POA: Diagnosis not present

## 2019-12-10 DIAGNOSIS — H0102A Squamous blepharitis right eye, upper and lower eyelids: Secondary | ICD-10-CM | POA: Diagnosis not present

## 2019-12-10 DIAGNOSIS — H0102B Squamous blepharitis left eye, upper and lower eyelids: Secondary | ICD-10-CM | POA: Diagnosis not present

## 2020-01-13 DIAGNOSIS — M545 Low back pain, unspecified: Secondary | ICD-10-CM | POA: Diagnosis not present

## 2020-02-02 ENCOUNTER — Encounter: Payer: Self-pay | Admitting: Internal Medicine

## 2020-02-02 NOTE — Progress Notes (Unsigned)
Annual  Screening/Preventative Visit  & Comprehensive Evaluation & Examination      This very nice 75 y.o.  MWM presents for a Screening /Preventative Visit & comprehensive evaluation and management of multiple medical co-morbidities.  Patient has been followed for HTN, CKD3b,  HLD, Prediabetes and Vitamin D Deficiency.      HTN predates since 2006. Patient's BP has been controlled at home.  Today's BP is at goal - 136/86. Patient has CKD3b (GFR 39)  attributed to his HTN.  Patient follows with Dr Myrene Buddy [facility].  Patient denies any cardiac symptoms as chest pain, palpitations, shortness of breath, dizziness or ankle swelling.      Patient's hyperlipidemia is controlled with diet and medications. Patient denies myalgias or other medication SE's. Last lipids were at goal:  Lab Results  Component Value Date   CHOL 121 10/16/2019   HDL 35 (L) 10/16/2019   LDLCALC 68 10/16/2019   TRIG 101 10/16/2019   CHOLHDL 3.5 10/16/2019       Patient has hx/o prediabetes (A1c 5.7%/2011) and patient denies reactive hypoglycemic symptoms, visual blurring, diabetic polys or paresthesias. Last A1c was normal & at goal:   Lab Results  Component Value Date   HGBA1C 5.3 07/15/2019         Finally, patient has history of Vitamin D Deficiency ("24"/2008) and last vitamin D was at goal:   Lab Results  Component Value Date   VD25OH 91 07/15/2019    Current Outpatient Medications on File Prior to Visit  Medication Sig  . aspirin 81 MG  Take  at bedtime.   Marland Kitchen atenolol  100 MG  TAKE 1 TABLET  DAILY  . CINNAMON  Take 1,000 mg  daily.  Marland Kitchen FLAXSEED OIL  Take 1 capsule  daily.  Marland Kitchen losartan  25 MG  Take 1 tablet  daily  . Magnesium 250 MG  Take daily  . Omega-3 FISH OIL 1,200 mg  Takeby  at bedtime.   . pravastatin  40 MG  Take 1 tablet at Bedtime  . VITAMIN D 5,000 Units  Take  daily.    Allergies  Allergen Reactions  . Ceftin [Cefuroxime Axetil] Hives  . Peanut-Containing Drug  Products Break out on nose     Past Medical History:  Diagnosis Date  . Chronic kidney disease   . Dyslipidemia   . ED (erectile dysfunction)   . GERD (gastroesophageal reflux disease)   . History of kidney stones   . Hyperlipidemia   . Hypertension   . Radiculopathy 04/30/2014  . Vitamin D deficiency    Health Maintenance  Topic Date Due  . TETANUS/TDAP  06/14/2016  . COVID-19 Vaccine (4 - Booster for Pfizer series) 05/15/2020  . COLONOSCOPY  06/28/2022  . INFLUENZA VACCINE  Completed  . Hepatitis C Screening  Completed  . PNA vac Low Risk Adult  Completed   Immunization History  Administered Date(s) Administered  . Influenza Whole 10/10/2012  . Influenza, High Dose Seasonal PF 10/09/2018, 10/16/2019  . Influenza-Unspecified 11/04/2014  . PFIZER SARS-COV-2 Vaccination 003/08/2019, 11/16/2019  . Pneumococcal Conjugate-13 02/05/2014  . Pneumococcal Polysaccharide-23 07/10/2012  . Td 03/07/2006   Last Colon -  04/29/2016 - Dr [name] - recc 5 year f/u due Apr 2023   Past Surgical History:  Procedure Laterality Date  . ANTERIOR CERVICAL DECOMP/DISCECTOMY FUSION N/A 04/30/2014   ANTERIOR CERVICAL DECOMPRESSION/DISCECTOMY FUSION 1 LEVEL;  Phylliss Bob, MD; Anterior cervical decompression fusion, cervical 7-thoracic 1 with instrumentation and allograft  .  COLONOSCOPY    . HAMMER TOE SURGERY  2012   left foot, 3rd toe  . LAPAROSCOPIC CHOLECYSTECTOMY  10/2010   Family History  Problem Relation Age of Onset  . Colon cancer Mother 60  . Ovarian cancer Mother   . Colon cancer Father 21  . Diabetes Father   . Alzheimer's disease Father   . Prostate cancer Father   . Stomach cancer Neg Hx   . Colon polyps Neg Hx   . Esophageal cancer Neg Hx   . Rectal cancer Neg Hx    Social History   Socioeconomic History  . Marital status: Married    Spouse name: Jeani Hawking  . Number of children: 1  Occupational History  . retired  Tobacco Use  . Smoking status: Never Smoker  .  Smokeless tobacco: Never Used  Vaping Use  . Vaping Use: Never used  Substance and Sexual Activity  . Alcohol use: No  . Drug use: No  . Sexual activity: Not on file    ROS Constitutional: Denies fever, chills, weight loss/gain, headaches, insomnia,  night sweats or change in appetite. Does c/o fatigue. Eyes: Denies redness, blurred vision, diplopia, discharge, itchy or watery eyes.  ENT: Denies discharge, congestion, post nasal drip, epistaxis, sore throat, earache, hearing loss, dental pain, Tinnitus, Vertigo, Sinus pain or snoring.  Cardio: Denies chest pain, palpitations, irregular heartbeat, syncope, dyspnea, diaphoresis, orthopnea, PND, claudication or edema Respiratory: denies cough, dyspnea, DOE, pleurisy, hoarseness, laryngitis or wheezing.  Gastrointestinal: Denies dysphagia, heartburn, reflux, water brash, pain, cramps, nausea, vomiting, bloating, diarrhea, constipation, hematemesis, melena, hematochezia, jaundice or hemorrhoids Genitourinary: Denies dysuria, frequency, urgency, nocturia, hesitancy, discharge, hematuria or flank pain Musculoskeletal: Denies arthralgia, myalgia, stiffness, Jt. Swelling, pain, limp or strain/sprain. Denies Falls. Skin: Denies puritis, rash, hives, warts, acne, eczema or change in skin lesion Neuro: No weakness, tremor, incoordination, spasms, paresthesia or pain Psychiatric: Denies confusion, memory loss or sensory loss. Denies Depression. Endocrine: Denies change in weight, skin, hair change, nocturia, and paresthesia, diabetic polys, visual blurring or hyper / hypo glycemic episodes.  Heme/Lymph: No excessive bleeding, bruising or enlarged lymph nodes.  Physical Exam  BP 136/86   Pulse 66   Temp (!) 97.5 F (36.4 C)   Resp 16   Ht 5' 9.5" (1.765 m)   Wt 215 lb 9.6 oz (97.8 kg)   SpO2 97%   BMI 31.38 kg/m   General Appearance: Well nourished and well groomed and in no apparent distress.  Eyes: PERRLA, EOMs, conjunctiva no swelling or  erythema, normal fundi and vessels. Sinuses: No frontal/maxillary tenderness ENT/Mouth: EACs patent / TMs  nl. Nares clear without erythema, swelling, mucoid exudates. Oral hygiene is good. No erythema, swelling, or exudate. Tongue normal, non-obstructing. Tonsils not swollen or erythematous. Hearing normal.  Neck: Supple, thyroid not palpable. No bruits, nodes or JVD. Respiratory: Respiratory effort normal.  BS equal and clear bilateral without rales, rhonci, wheezing or stridor. Cardio: Heart sounds are normal with regular rate and rhythm and no murmurs, rubs or gallops. Peripheral pulses are normal and equal bilaterally without edema. No aortic or femoral bruits. Chest: symmetric with normal excursions and percussion.  Abdomen: Soft, with Nl bowel sounds. Nontender, no guarding, rebound, hernias, masses, or organomegaly.  Lymphatics: Non tender without lymphadenopathy.  Musculoskeletal: Full ROM all peripheral extremities, joint stability, 5/5 strength, and normal gait. Skin: Warm and dry without rashes, lesions, cyanosis, clubbing or  ecchymosis.  Neuro: Cranial nerves intact, reflexes equal bilaterally. Normal muscle tone, no cerebellar symptoms.  Sensation intact.  Pysch: Alert and oriented X 3 with normal affect, insight and judgment appropriate.   Assessment and Plan  1. Annual Preventative/Screening Exam     2. Essential hypertension  - EKG 12-Lead - [place], RETROPERITNL ABD,  LTD - Urinalysis, Routine w reflex microscopic - Microalbumin / creatinine urine ratio - CBC with Differential/Platelet - COMPLETE METABOLIC PANEL WITH GFR - Magnesium - TSH  3. Hyperlipidemia, mixed  - EKG 12-Lead - [place], RETROPERITNL ABD,  LTD - Lipid panel - TSH  4. Abnormal glucose  - EKG 12-Lead - [place], RETROPERITNL ABD,  LTD - Hemoglobin A1c - Insulin, random  5. Vitamin D deficiency  - VITAMIN D 25 Hydroxy   6. BPH with obstruction/lower urinary tract symptoms  - PSA  7. Stage 3b  chronic kidney disease (HCC)  - Urinalysis, Routine w reflex microscopic - Microalbumin / creatinine urine ratio - COMPLETE METABOLIC PANEL WITH GFR  8. Screening for colorectal cancer  - POC Hemoccult Bld/Stl  9. Prostate cancer screening  - PSA  10. Screening for ischemic heart disease  - EKG 12-Lead  11. Screening for AAA (aortic abdominal aneurysm)  - [place], RETROPERITNL ABD,  LTD  12. Medication management  - Urinalysis, Routine w reflex microscopic - Microalbumin / creatinine urine ratio - CBC with Differential/Platelet - COMPLETE METABOLIC PANEL WITH GFR - Magnesium - Lipid panel - TSH - Hemoglobin A1c - Insulin, random - VITAMIN D 25 Hydroxy         Patient was counseled in prudent diet, weight control to achieve/maintain BMI less than 25, BP monitoring, regular exercise and medications as discussed.  Discussed med effects and SE's. Routine screening labs and tests as requested with regular follow-up as recommended. Over 40 minutes of exam, counseling, chart review and high complex critical decision making was performed   Kirtland Bouchard, MD

## 2020-02-02 NOTE — Patient Instructions (Signed)

## 2020-02-03 ENCOUNTER — Other Ambulatory Visit: Payer: Self-pay

## 2020-02-03 ENCOUNTER — Ambulatory Visit (INDEPENDENT_AMBULATORY_CARE_PROVIDER_SITE_OTHER): Payer: Medicare Other | Admitting: Internal Medicine

## 2020-02-03 VITALS — BP 136/86 | HR 66 | Temp 97.5°F | Resp 16 | Ht 69.5 in | Wt 215.6 lb

## 2020-02-03 DIAGNOSIS — Z136 Encounter for screening for cardiovascular disorders: Secondary | ICD-10-CM | POA: Diagnosis not present

## 2020-02-03 DIAGNOSIS — Z1212 Encounter for screening for malignant neoplasm of rectum: Secondary | ICD-10-CM

## 2020-02-03 DIAGNOSIS — N1832 Chronic kidney disease, stage 3b: Secondary | ICD-10-CM

## 2020-02-03 DIAGNOSIS — E782 Mixed hyperlipidemia: Secondary | ICD-10-CM | POA: Diagnosis not present

## 2020-02-03 DIAGNOSIS — Z125 Encounter for screening for malignant neoplasm of prostate: Secondary | ICD-10-CM

## 2020-02-03 DIAGNOSIS — I10 Essential (primary) hypertension: Secondary | ICD-10-CM

## 2020-02-03 DIAGNOSIS — R7309 Other abnormal glucose: Secondary | ICD-10-CM | POA: Diagnosis not present

## 2020-02-03 DIAGNOSIS — Z1211 Encounter for screening for malignant neoplasm of colon: Secondary | ICD-10-CM

## 2020-02-03 DIAGNOSIS — E559 Vitamin D deficiency, unspecified: Secondary | ICD-10-CM | POA: Diagnosis not present

## 2020-02-03 DIAGNOSIS — Z0000 Encounter for general adult medical examination without abnormal findings: Secondary | ICD-10-CM

## 2020-02-03 DIAGNOSIS — Z0001 Encounter for general adult medical examination with abnormal findings: Secondary | ICD-10-CM

## 2020-02-03 DIAGNOSIS — Z79899 Other long term (current) drug therapy: Secondary | ICD-10-CM

## 2020-02-03 DIAGNOSIS — N138 Other obstructive and reflux uropathy: Secondary | ICD-10-CM

## 2020-02-04 LAB — COMPLETE METABOLIC PANEL WITH GFR
AG Ratio: 1.7 (calc) (ref 1.0–2.5)
ALT: 19 U/L (ref 9–46)
AST: 18 U/L (ref 10–35)
Albumin: 4.3 g/dL (ref 3.6–5.1)
Alkaline phosphatase (APISO): 71 U/L (ref 35–144)
BUN/Creatinine Ratio: 13 (calc) (ref 6–22)
BUN: 24 mg/dL (ref 7–25)
CO2: 30 mmol/L (ref 20–32)
Calcium: 9.7 mg/dL (ref 8.6–10.3)
Chloride: 102 mmol/L (ref 98–110)
Creat: 1.84 mg/dL — ABNORMAL HIGH (ref 0.70–1.18)
GFR, Est African American: 41 mL/min/{1.73_m2} — ABNORMAL LOW (ref >=60)
GFR, Est Non African American: 36 mL/min/{1.73_m2} — ABNORMAL LOW (ref >=60)
Globulin: 2.5 g/dL (calc) (ref 1.9–3.7)
Glucose, Bld: 109 mg/dL — ABNORMAL HIGH (ref 65–99)
Potassium: 5.2 mmol/L (ref 3.5–5.3)
Sodium: 139 mmol/L (ref 135–146)
Total Bilirubin: 0.8 mg/dL (ref 0.2–1.2)
Total Protein: 6.8 g/dL (ref 6.1–8.1)

## 2020-02-04 LAB — MICROALBUMIN / CREATININE URINE RATIO
Creatinine, Urine: 117 mg/dL (ref 20–320)
Microalb Creat Ratio: 6 mcg/mg creat (ref <30)
Microalb, Ur: 0.7 mg/dL

## 2020-02-04 LAB — CBC WITH DIFFERENTIAL/PLATELET
Absolute Monocytes: 230 cells/uL (ref 200–950)
Basophils Absolute: 53 cells/uL (ref 0–200)
Basophils Relative: 0.9 %
Eosinophils Absolute: 230 cells/uL (ref 15–500)
Eosinophils Relative: 3.9 %
HCT: 50 % (ref 38.5–50.0)
Hemoglobin: 16.9 g/dL (ref 13.2–17.1)
Lymphs Abs: 1699 cells/uL (ref 850–3900)
MCH: 29.4 pg (ref 27.0–33.0)
MCHC: 33.8 g/dL (ref 32.0–36.0)
MCV: 87.1 fL (ref 80.0–100.0)
MPV: 8.7 fL (ref 7.5–12.5)
Monocytes Relative: 3.9 %
Neutro Abs: 3688 cells/uL (ref 1500–7800)
Neutrophils Relative %: 62.5 %
Platelets: 271 10*3/uL (ref 140–400)
RBC: 5.74 10*6/uL (ref 4.20–5.80)
RDW: 13.1 % (ref 11.0–15.0)
Total Lymphocyte: 28.8 %
WBC: 5.9 10*3/uL (ref 3.8–10.8)

## 2020-02-04 LAB — URINALYSIS, ROUTINE W REFLEX MICROSCOPIC
Bacteria, UA: NONE SEEN /HPF
Bilirubin Urine: NEGATIVE
Glucose, UA: NEGATIVE
Hyaline Cast: NONE SEEN /LPF
Ketones, ur: NEGATIVE
Leukocytes,Ua: NEGATIVE
Nitrite: NEGATIVE
Protein, ur: NEGATIVE
RBC / HPF: NONE SEEN /HPF (ref 0–2)
Specific Gravity, Urine: 1.013 (ref 1.001–1.03)
Squamous Epithelial / HPF: NONE SEEN /HPF (ref <=5)
WBC, UA: NONE SEEN /HPF (ref 0–5)
pH: 5.5 (ref 5.0–8.0)

## 2020-02-04 LAB — LIPID PANEL
Cholesterol: 121 mg/dL (ref <200)
HDL: 32 mg/dL — ABNORMAL LOW (ref >=40)
LDL Cholesterol (Calc): 64 mg/dL (calc)
Non-HDL Cholesterol (Calc): 89 mg/dL (calc) (ref <130)
Total CHOL/HDL Ratio: 3.8 (calc) (ref <5.0)
Triglycerides: 171 mg/dL — ABNORMAL HIGH (ref <150)

## 2020-02-04 LAB — VITAMIN D 25 HYDROXY (VIT D DEFICIENCY, FRACTURES): Vit D, 25-Hydroxy: 96 ng/mL (ref 30–100)

## 2020-02-04 LAB — TSH: TSH: 2.21 mIU/L (ref 0.40–4.50)

## 2020-02-04 LAB — HEMOGLOBIN A1C
Hgb A1c MFr Bld: 5.6 % of total Hgb (ref <5.7)
Mean Plasma Glucose: 114 mg/dL
eAG (mmol/L): 6.3 mmol/L

## 2020-02-04 LAB — MAGNESIUM: Magnesium: 2 mg/dL (ref 1.5–2.5)

## 2020-02-04 LAB — PSA: PSA: 3.49 ng/mL (ref <=4.0)

## 2020-02-04 LAB — INSULIN, RANDOM: Insulin: 297 u[IU]/mL — ABNORMAL HIGH

## 2020-02-04 MED ORDER — GABAPENTIN 300 MG PO CAPS: ORAL_CAPSULE | ORAL | 0 refills | Status: DC

## 2020-02-04 NOTE — Progress Notes (Signed)
========================================================== -   Test results slightly outside the reference range are not unusual. If there is anything important, I will review this with you,  otherwise it is considered normal test values.  If you have further questions,  please do not hesitate to contact me at the office or via My Chart.  ========================================================== ==========================================================  -  PSA is slightly elevated, but still in Normal range                                                          - Will continue to monitor closely  ==========================================================  -  Kidney functions slightly decreased (GFR 36), but still in Stage 3b   -  (sent copy to Dr Joylene Grapes)  ========================================================== ==========================================================  -  Total Chol = 121 and LDL Chol = 64 - Both . Excellent   - Very low risk for Heart Attack  / Stroke ========================================================  - A1c - Normal - Great - No Diabetes ========================================================== ==========================================================  -  Insulin level is elevated  and shows insulin resistance - a sign of   early diabetes and associated with a 300 % greater risk for   heart attacks, strokes, cancer & Alzheimer type vascular dementia   - All this can be cured  and prevented with losing weight   - get Dr Fara Olden Fuhrman's book 'the End of Diabetes" and "the End of Dieting"   - and add many years of good health to your life. ========================================================== ==========================================================  -  Vitamin D = 96 - Great   ========================================================== ==========================================================  -  All Else - CBC - Electrolytes - Liver  - Magnesium & Thyroid    - all  Normal / OK ===========================================================  ===========================================================

## 2020-02-04 NOTE — Addendum Note (Signed)
Addended by: [name] on: 02/04/2020 01:19 PM   Modules accepted: Orders

## 2020-02-09 ENCOUNTER — Other Ambulatory Visit: Payer: Self-pay | Admitting: Internal Medicine

## 2020-02-09 DIAGNOSIS — E782 Mixed hyperlipidemia: Secondary | ICD-10-CM

## 2020-02-18 ENCOUNTER — Other Ambulatory Visit: Payer: Self-pay

## 2020-02-18 DIAGNOSIS — Z1212 Encounter for screening for malignant neoplasm of rectum: Secondary | ICD-10-CM | POA: Diagnosis not present

## 2020-02-18 DIAGNOSIS — Z1211 Encounter for screening for malignant neoplasm of colon: Secondary | ICD-10-CM | POA: Diagnosis not present

## 2020-02-18 LAB — POC HEMOCCULT BLD/STL (HOME/3-CARD/SCREEN)
Card #2 Fecal Occult Blod, POC: NEGATIVE
Card #3 Fecal Occult Blood, POC: NEGATIVE
Fecal Occult Blood, POC: NEGATIVE

## 2020-03-23 DIAGNOSIS — E875 Hyperkalemia: Secondary | ICD-10-CM | POA: Diagnosis not present

## 2020-03-23 DIAGNOSIS — I129 Hypertensive chronic kidney disease with stage 1 through stage 4 chronic kidney disease, or unspecified chronic kidney disease: Secondary | ICD-10-CM | POA: Diagnosis not present

## 2020-03-23 DIAGNOSIS — N200 Calculus of kidney: Secondary | ICD-10-CM | POA: Diagnosis not present

## 2020-03-23 DIAGNOSIS — N1832 Chronic kidney disease, stage 3b: Secondary | ICD-10-CM | POA: Diagnosis not present

## 2020-04-02 ENCOUNTER — Other Ambulatory Visit: Payer: Self-pay | Admitting: Adult Health

## 2020-04-02 DIAGNOSIS — H02834 Dermatochalasis of left upper eyelid: Secondary | ICD-10-CM | POA: Diagnosis not present

## 2020-04-02 DIAGNOSIS — H0102B Squamous blepharitis left eye, upper and lower eyelids: Secondary | ICD-10-CM | POA: Diagnosis not present

## 2020-04-02 DIAGNOSIS — H2513 Age-related nuclear cataract, bilateral: Secondary | ICD-10-CM | POA: Diagnosis not present

## 2020-04-02 DIAGNOSIS — H16223 Keratoconjunctivitis sicca, not specified as Sjogren's, bilateral: Secondary | ICD-10-CM | POA: Diagnosis not present

## 2020-04-02 DIAGNOSIS — H1045 Other chronic allergic conjunctivitis: Secondary | ICD-10-CM | POA: Diagnosis not present

## 2020-04-02 DIAGNOSIS — H0102A Squamous blepharitis right eye, upper and lower eyelids: Secondary | ICD-10-CM | POA: Diagnosis not present

## 2020-04-02 DIAGNOSIS — H02831 Dermatochalasis of right upper eyelid: Secondary | ICD-10-CM | POA: Diagnosis not present

## 2020-04-02 DIAGNOSIS — H11122 Conjunctival concretions, left eye: Secondary | ICD-10-CM | POA: Diagnosis not present

## 2020-04-09 DIAGNOSIS — L820 Inflamed seborrheic keratosis: Secondary | ICD-10-CM | POA: Diagnosis not present

## 2020-04-09 DIAGNOSIS — D485 Neoplasm of uncertain behavior of skin: Secondary | ICD-10-CM | POA: Diagnosis not present

## 2020-04-09 DIAGNOSIS — Z85828 Personal history of other malignant neoplasm of skin: Secondary | ICD-10-CM | POA: Diagnosis not present

## 2020-04-09 DIAGNOSIS — L821 Other seborrheic keratosis: Secondary | ICD-10-CM | POA: Diagnosis not present

## 2020-04-09 DIAGNOSIS — L570 Actinic keratosis: Secondary | ICD-10-CM | POA: Diagnosis not present

## 2020-04-21 DIAGNOSIS — H11122 Conjunctival concretions, left eye: Secondary | ICD-10-CM | POA: Diagnosis not present

## 2020-04-21 DIAGNOSIS — H1045 Other chronic allergic conjunctivitis: Secondary | ICD-10-CM | POA: Diagnosis not present

## 2020-04-21 DIAGNOSIS — H16223 Keratoconjunctivitis sicca, not specified as Sjogren's, bilateral: Secondary | ICD-10-CM | POA: Diagnosis not present

## 2020-04-21 DIAGNOSIS — H0102A Squamous blepharitis right eye, upper and lower eyelids: Secondary | ICD-10-CM | POA: Diagnosis not present

## 2020-04-21 DIAGNOSIS — H2513 Age-related nuclear cataract, bilateral: Secondary | ICD-10-CM | POA: Diagnosis not present

## 2020-04-21 DIAGNOSIS — H02834 Dermatochalasis of left upper eyelid: Secondary | ICD-10-CM | POA: Diagnosis not present

## 2020-04-21 DIAGNOSIS — H02831 Dermatochalasis of right upper eyelid: Secondary | ICD-10-CM | POA: Diagnosis not present

## 2020-04-21 DIAGNOSIS — H0102B Squamous blepharitis left eye, upper and lower eyelids: Secondary | ICD-10-CM | POA: Diagnosis not present

## 2020-05-01 NOTE — Progress Notes (Signed)
MEDICARE ANNUAL WELLNESS VISIT AND FOLLOW UP Assessment:    Encounter for Medicare annual wellness exam Due annually   Essential hypertension Monitor blood pressure at home; call if consistently over 130/80 Continue DASH diet.   Reminder to go to the ER if any CP, SOB, nausea, dizziness, severe HA, changes vision/speech, left arm numbness and tingling and jaw pain.  Gastroesophageal reflux disease, esophagitis presence not specified Well managed on current medications, PPI PRN <2/week Discussed diet, avoiding triggers and other lifestyle changes  Vitamin D deficiency At goal at recent check; continue to recommend supplementation for goal of 70-100 Defer vitamin D level  Other abnormal glucose Recent A1Cs at goal Discussed diet/exercise, weight management  Defer A1C; check CMP  Obesity (BMI 30.0-34.9) Long discussion about weight loss, diet, and exercise Recommended diet heavy in fruits and veggies and low in animal meats, cheeses, and dairy products, appropriate calorie intake Patient will work on cutting sugar, watching portions, increase walking Discussed appropriate weight for height and initial goal (<200 lb) Follow up at next visit  Medication management CBC, CMP/GFR, magnesium  Hyperlipidemia Continue medications: pravastatin 40 mg daily Continue low cholesterol diet and exercise.  Check lipid panel.   History of colonic polyps  UTD colonoscopy; high fiber diet encouraged  CKD (chronic kidney disease) stage 3b, GFR 30-44 ml/min (HCC) Increase fluids, avoid NSAIDS, monitor sugars, will monitor Nephrology following  Erectile dysfunction Sildenafil PRN works well, uses rarely    Over 30 minutes of exam, counseling, chart review, and critical decision making was performed  Future Appointments  Date Time Provider [place]  08/05/2020 11:30 AM [name], MD [facility] None  02/10/2021 11:00 AM [name], MD [facility] None  05/04/2021 10:00 AM  [name], NP [facility] None     Plan:   During the course of the visit the patient was educated and counseled about appropriate screening and preventive services including:    Pneumococcal vaccine   Influenza vaccine  Prevnar 13  Td vaccine  Screening electrocardiogram  Colorectal cancer screening  Diabetes screening  Glaucoma screening  Nutrition counseling    Subjective:  Jeffrey Carey is a 75 y.o. male who presents for Medicare Annual Wellness Visit and 3 month follow up for HTN, hyperlipidemia, glucose management, and vitamin D Def.   His wife was diagnosed with breast cancer in 2020, she is undergoing chemo q3weeks, doing better.  He takes sildenafil 20 mg PRN for ED.   he has a diagnosis of GERD which is currently managed by omeprazole 20 mg PRN, <2/week he reports symptoms is currently well controlled, and denies breakthrough reflux, burning in chest, hoarseness or cough.    BMI is Body mass index is 31.59 kg/m., he has been working on diet and exercise, admits has been snacking too much, working in yard, [employer] several, plans to restart walking. He does weigh himself regularly.  Wt Readings from Last 3 Encounters:  05/04/20 217 lb (98.4 kg)  02/03/20 215 lb 9.6 oz (97.8 kg)  12/04/19 219 lb (99.3 kg)   His blood pressure has been controlled at home (130s/low 80s), today their BP is BP: 132/80 He does not workout. He denies chest pain, shortness of breath, dizziness.   He is on cholesterol medication (pravastatin 40 mg daily) and denies myalgias. His cholesterol is at goal. The cholesterol last visit was:   Lab Results  Component Value Date   CHOL 121 02/03/2020   HDL 32 (L) 02/03/2020   LDLCALC 64 02/03/2020  TRIG 171 (H) 02/03/2020   CHOLHDL 3.8 02/03/2020   He has been working on diet and exercise for history of prediabetes (last elevation 5.7% in 2017), and denies increased appetite, nausea, paresthesia of the feet, polydipsia, polyuria,  visual disturbances, vomiting and weight loss. Last A1C in the office was:  Lab Results  Component Value Date   HGBA1C 5.6 02/03/2020   He has CKD IIIb without albuminuria monitored at nephrology, Dr. Joylene Grapes following q78m, getting 3-4 bottles in daily:  Lab Results  Component Value Date   GFRNONAA 36 (L) 02/03/2020   GFRNONAA 39 (L) 10/16/2019   GFRNONAA 38 (L) 08/05/2019    Patient is on Vitamin D supplement.   Lab Results  Component Value Date   VD25OH 96 02/03/2020      Medication Review:   Current Outpatient Medications (Cardiovascular):  .  atenolol (TENORMIN) 100 MG tablet, TAKE 1 TABLET BY MOUTH DAILY FOR BLOOD PRESSURE .  losartan (COZAAR) 25 MG tablet, Take 1 tablet (25 mg total) by mouth daily. For blood pressure and kidney protection. .  pravastatin (PRAVACHOL) 40 MG tablet, TAKE 1 TABLET BY MOUTH AT BEDTIME FOR CHOLESTEROL   Current Outpatient Medications (Analgesics):  .  aspirin 81 MG tablet, Take 81 mg by mouth at bedtime.    Current Outpatient Medications (Other):  Marland Kitchen  CINNAMON PO, Take 1,000 mg by mouth daily. .  Flaxseed, Linseed, (FLAXSEED OIL PO), Take 1 capsule by mouth daily. .  Magnesium 250 MG TABS, Take by mouth. .  Omega-3 Fatty Acids (FISH OIL PO), Take 1,200 mg by mouth at bedtime.  Marland Kitchen  trimethoprim-polymyxin b (POLYTRIM) ophthalmic solution, PLACE 1 DROP INTO BOTH EYES EVERY 4 (FOUR) HOURS. FOR 10 DAYS. Marland Kitchen  VITAMIN D PO, Take 5,000 Units by mouth daily. Marland Kitchen  gabapentin (NEURONTIN) 300 MG capsule, Take 1 to 3 capsules at night for Pain (Patient not taking: Reported on 05/04/2020)  Allergies: Allergies  Allergen Reactions  . Ceftin [Cefuroxime Axetil] Hives  . Peanut-Containing Drug Products Other (See Comments)    Break out on nose     Current Problems (verified) has History of colonic polyps; Essential hypertension; Hyperlipidemia, mixed; Abnormal glucose; Vitamin D deficiency; Gastroesophageal reflux disease; Obesity (BMI 30.0-34.9); CKD  (chronic kidney disease) stage 3, GFR 30-59 ml/min ([place]); History of prediabetes; and ED (erectile dysfunction) on their problem list.  Screening Tests Immunization History  Administered Date(s) Administered  . Influenza Whole 10/10/2012  . Influenza, High Dose Seasonal PF 11/07/2016, 10/26/2017, 10/09/2018, 10/16/2019  . Influenza-Unspecified 11/04/2014  . PFIZER(Purple Top)SARS-COV-2 Vaccination 02/14/2019, 03/11/2019, 11/16/2019  . Pneumococcal Conjugate-13 02/05/2014  . Pneumococcal Polysaccharide-23 07/10/2012  . Td 03/07/2006   Preventative care: Last colonoscopy: 06/28/2019 - 3 year recall, Dr. [name]  Prior vaccinations: TD : 2008, would like to wait  Influenza: 10/2019 Pneumococcal: 07/2012 Prevnar/13: 02/05/2014 Shingles/Zostavax: declines due to 95$ co-pay Covid 19: 3/3, 2021  Names of Other Physician/Practitioners you currently use: 1. [facility] here for primary care 2. Dr. Katy Fitch, eye doctor, last visit 2022 3. Dr. Rollene Fare, dentist, last visit 2022, q58m  Patient Care Team: [name], MD as PCP - General (Internal Medicine) Kathie Rhodes, MD (Inactive) as Consulting Physician (Urology) Jolyne Loa, DDS (Dentistry) [place] (Optometry) Joylene Grapes Shaune Pollack, MD as Consulting Physician (Nephrology)  Surgical: He  has a past surgical history that includes Laparoscopic cholecystectomy (10/2010); Hammer toe surgery (2012); Anterior cervical decomp/discectomy fusion (N/A, 04/30/2014); and Colonoscopy. Family His family history includes Alzheimer's  disease in his father; Colon cancer (age of onset: 15) in his father and mother; Diabetes in his father; Ovarian cancer in his mother; Prostate cancer in his father. Social history  He reports that he has never smoked. He has never used smokeless tobacco. He reports that he does not drink alcohol and does not use drugs.  MEDICARE WELLNESS OBJECTIVES: Physical activity: Current  Exercise Habits: The patient does not participate in regular exercise at present, Exercise limited by: None identified Cardiac risk factors: Cardiac Risk Factors include: advanced age (>54men, >19 women);dyslipidemia;hypertension;male gender;obesity (BMI >30kg/m2) Depression/mood screen:   Depression screen [facility] 2/9 05/04/2020  Decreased Interest 0  Down, Depressed, Hopeless 0  PHQ - 2 Score 0    ADLs:  In your present state of health, do you have any difficulty performing the following activities: 05/04/2020 02/02/2020  Hearing? N N  Vision? N N  Difficulty concentrating or making decisions? N N  Walking or climbing stairs? N N  Dressing or bathing? N N  Doing errands, shopping? N N  Some recent data might be hidden     Cognitive Testing  Alert? Yes  Normal Appearance?Yes  Oriented to person? Yes  Place? Yes   Time? Yes  Recall of three objects?  Yes  Can perform simple calculations? Yes  Displays appropriate judgment?Yes  Can read the correct time from a watch face?Yes  EOL planning: Does Patient Have a Medical Advance Directive?: No Would patient like information on creating a medical advance directive?: No - Patient declined   Objective:   Today's Vitals   05/04/20 0946  BP: 132/80  Pulse: (!) 51  Temp: (!) 97.3 F (36.3 C)  SpO2: 97%  Weight: 217 lb (98.4 kg)   Body mass index is 31.59 kg/m.  General appearance: alert, no distress, WD/WN, male HEENT: normocephalic, sclerae anicteric, TMs pearly, nares patent, no discharge or erythema, pharynx normal Oral cavity: MMM, no lesions Neck: supple, no lymphadenopathy, no thyromegaly, no masses Heart: RRR, normal S1, S2, no murmurs Lungs: CTA bilaterally, no wheezes, rhonchi, or rales Abdomen: +bs, soft, non tender, non distended, no masses, no hepatomegaly, no splenomegaly Musculoskeletal: nontender, no swelling, no obvious deformity.  Extremities: no edema, no cyanosis, no clubbing Pulses: 2+ symmetric, upper and lower  extremities, normal cap refill Neurological: alert, oriented x 3, CN2-12 intact, strength normal upper extremities and lower extremities, sensation normal throughout, DTRs 2+ throughout, no cerebellar signs, gait normal Psychiatric: normal affect, behavior normal, pleasant   Medicare Attestation I have personally reviewed: The patient's medical and social history Their use of alcohol, tobacco or illicit drugs Their current medications and supplements The patient's functional ability including ADLs,fall risks, home safety risks, cognitive, and hearing and visual impairment Diet and physical activities Evidence for depression or mood disorders  The patient's weight, height, BMI, and visual acuity have been recorded in the chart.  I have made referrals, counseling, and provided education to the patient based on review of the above and I have provided the patient with a written personalized care plan for preventive services.     Izora Ribas, NP   05/04/2020

## 2020-05-04 ENCOUNTER — Ambulatory Visit (INDEPENDENT_AMBULATORY_CARE_PROVIDER_SITE_OTHER): Payer: Medicare Other | Admitting: Adult Health

## 2020-05-04 ENCOUNTER — Encounter: Payer: Self-pay | Admitting: Adult Health

## 2020-05-04 ENCOUNTER — Other Ambulatory Visit: Payer: Self-pay

## 2020-05-04 VITALS — BP 132/80 | HR 51 | Temp 97.3°F | Wt 217.0 lb

## 2020-05-04 DIAGNOSIS — N1832 Chronic kidney disease, stage 3b: Secondary | ICD-10-CM | POA: Diagnosis not present

## 2020-05-04 DIAGNOSIS — E559 Vitamin D deficiency, unspecified: Secondary | ICD-10-CM

## 2020-05-04 DIAGNOSIS — E782 Mixed hyperlipidemia: Secondary | ICD-10-CM | POA: Diagnosis not present

## 2020-05-04 DIAGNOSIS — Z0001 Encounter for general adult medical examination with abnormal findings: Secondary | ICD-10-CM

## 2020-05-04 DIAGNOSIS — R7309 Other abnormal glucose: Secondary | ICD-10-CM

## 2020-05-04 DIAGNOSIS — E669 Obesity, unspecified: Secondary | ICD-10-CM

## 2020-05-04 DIAGNOSIS — N529 Male erectile dysfunction, unspecified: Secondary | ICD-10-CM

## 2020-05-04 DIAGNOSIS — K219 Gastro-esophageal reflux disease without esophagitis: Secondary | ICD-10-CM | POA: Diagnosis not present

## 2020-05-04 DIAGNOSIS — Z86010 Personal history of colonic polyps: Secondary | ICD-10-CM | POA: Diagnosis not present

## 2020-05-04 DIAGNOSIS — Z87898 Personal history of other specified conditions: Secondary | ICD-10-CM

## 2020-05-04 DIAGNOSIS — R6889 Other general symptoms and signs: Secondary | ICD-10-CM

## 2020-05-04 DIAGNOSIS — I10 Essential (primary) hypertension: Secondary | ICD-10-CM | POA: Diagnosis not present

## 2020-05-04 DIAGNOSIS — Z0000 Encounter for general adult medical examination without abnormal findings: Secondary | ICD-10-CM

## 2020-05-04 DIAGNOSIS — R5383 Other fatigue: Secondary | ICD-10-CM | POA: Diagnosis not present

## 2020-05-04 NOTE — Patient Instructions (Signed)
  [name] , Thank you for taking time to come for your Medicare Wellness Visit. I appreciate your ongoing commitment to your health goals. Please review the following plan we discussed and let me know if I can assist you in the future.   These are the goals we discussed: Goals    . Blood Pressure < 130/80    . Weight (lb) < 200 lb (90.7 kg)       This is a list of the screening recommended for you and due dates:  Health Maintenance  Topic Date Due  . Tetanus Vaccine  05/04/2021*  . COVID-19 Vaccine (4 - Booster for Pfizer series) 05/15/2020  . Flu Shot  08/03/2020  . Colon Cancer Screening  06/28/2022  .  Hepatitis C: One time screening is recommended by Center for Disease Control  (CDC) for  adults born from [birth year] through 1964.   Completed  . Pneumonia vaccines  Completed  . HPV Vaccine  Aged Out  *Topic was postponed. The date shown is not the original due date.

## 2020-05-05 ENCOUNTER — Ambulatory Visit: Payer: Medicare Other | Admitting: Adult Health

## 2020-05-05 ENCOUNTER — Other Ambulatory Visit: Payer: Self-pay | Admitting: Adult Health

## 2020-05-05 ENCOUNTER — Encounter: Payer: Self-pay | Admitting: Adult Health

## 2020-05-05 DIAGNOSIS — E875 Hyperkalemia: Secondary | ICD-10-CM | POA: Insufficient documentation

## 2020-05-06 LAB — COMPLETE METABOLIC PANEL WITH GFR
AG Ratio: 1.8 (calc) (ref 1.0–2.5)
ALT: 19 U/L (ref 9–46)
AST: 20 U/L (ref 10–35)
Albumin: 4.5 g/dL (ref 3.6–5.1)
Alkaline phosphatase (APISO): 62 U/L (ref 35–144)
BUN/Creatinine Ratio: 16 (calc) (ref 6–22)
BUN: 25 mg/dL (ref 7–25)
CO2: 28 mmol/L (ref 20–32)
Calcium: 9.8 mg/dL (ref 8.6–10.3)
Chloride: 105 mmol/L (ref 98–110)
Creat: 1.6 mg/dL — ABNORMAL HIGH (ref 0.70–1.18)
GFR, Est African American: 49 mL/min/{1.73_m2} — ABNORMAL LOW (ref >=60)
GFR, Est Non African American: 42 mL/min/{1.73_m2} — ABNORMAL LOW (ref >=60)
Globulin: 2.5 g/dL (calc) (ref 1.9–3.7)
Glucose, Bld: 89 mg/dL (ref 65–99)
Potassium: 5.5 mmol/L — ABNORMAL HIGH (ref 3.5–5.3)
Sodium: 141 mmol/L (ref 135–146)
Total Bilirubin: 0.6 mg/dL (ref 0.2–1.2)
Total Protein: 7 g/dL (ref 6.1–8.1)

## 2020-05-06 LAB — LIPID PANEL
Cholesterol: 135 mg/dL (ref <200)
HDL: 36 mg/dL — ABNORMAL LOW (ref >=40)
LDL Cholesterol (Calc): 76 mg/dL (calc)
Non-HDL Cholesterol (Calc): 99 mg/dL (calc) (ref <130)
Total CHOL/HDL Ratio: 3.8 (calc) (ref <5.0)
Triglycerides: 148 mg/dL (ref <150)

## 2020-05-06 LAB — CBC WITH DIFFERENTIAL/PLATELET
Absolute Monocytes: 440 cells/uL (ref 200–950)
Basophils Absolute: 57 cells/uL (ref 0–200)
Basophils Relative: 0.8 %
Eosinophils Absolute: 156 cells/uL (ref 15–500)
Eosinophils Relative: 2.2 %
HCT: 53.3 % — ABNORMAL HIGH (ref 38.5–50.0)
Hemoglobin: 17.4 g/dL — ABNORMAL HIGH (ref 13.2–17.1)
Lymphs Abs: 1669 cells/uL (ref 850–3900)
MCH: 29.2 pg (ref 27.0–33.0)
MCHC: 32.6 g/dL (ref 32.0–36.0)
MCV: 89.6 fL (ref 80.0–100.0)
MPV: 9.2 fL (ref 7.5–12.5)
Monocytes Relative: 6.2 %
Neutro Abs: 4778 cells/uL (ref 1500–7800)
Neutrophils Relative %: 67.3 %
Platelets: 294 10*3/uL (ref 140–400)
RBC: 5.95 10*6/uL — ABNORMAL HIGH (ref 4.20–5.80)
RDW: 12.6 % (ref 11.0–15.0)
Total Lymphocyte: 23.5 %
WBC: 7.1 10*3/uL (ref 3.8–10.8)

## 2020-05-06 LAB — IRON,TIBC AND FERRITIN PANEL
%SAT: 35 % (ref 20–48)
Ferritin: 106 ng/mL (ref 24–380)
Iron: 105 ug/dL (ref 50–180)
TIBC: 300 ug/dL (ref 250–425)

## 2020-05-06 LAB — MAGNESIUM: Magnesium: 2.2 mg/dL (ref 1.5–2.5)

## 2020-05-06 LAB — TEST AUTHORIZATION

## 2020-05-06 LAB — TSH: TSH: 3.1 mIU/L (ref 0.40–4.50)

## 2020-05-12 DIAGNOSIS — H16223 Keratoconjunctivitis sicca, not specified as Sjogren's, bilateral: Secondary | ICD-10-CM | POA: Diagnosis not present

## 2020-05-12 DIAGNOSIS — H0102A Squamous blepharitis right eye, upper and lower eyelids: Secondary | ICD-10-CM | POA: Diagnosis not present

## 2020-05-12 DIAGNOSIS — H11122 Conjunctival concretions, left eye: Secondary | ICD-10-CM | POA: Diagnosis not present

## 2020-05-12 DIAGNOSIS — H0102B Squamous blepharitis left eye, upper and lower eyelids: Secondary | ICD-10-CM | POA: Diagnosis not present

## 2020-05-12 DIAGNOSIS — H02834 Dermatochalasis of left upper eyelid: Secondary | ICD-10-CM | POA: Diagnosis not present

## 2020-05-12 DIAGNOSIS — H1045 Other chronic allergic conjunctivitis: Secondary | ICD-10-CM | POA: Diagnosis not present

## 2020-05-12 DIAGNOSIS — H02831 Dermatochalasis of right upper eyelid: Secondary | ICD-10-CM | POA: Diagnosis not present

## 2020-05-12 DIAGNOSIS — H2513 Age-related nuclear cataract, bilateral: Secondary | ICD-10-CM | POA: Diagnosis not present

## 2020-05-14 ENCOUNTER — Other Ambulatory Visit: Payer: Self-pay | Admitting: Adult Health Nurse Practitioner

## 2020-05-14 ENCOUNTER — Other Ambulatory Visit: Payer: Self-pay | Admitting: Adult Health

## 2020-05-14 DIAGNOSIS — I10 Essential (primary) hypertension: Secondary | ICD-10-CM

## 2020-05-14 DIAGNOSIS — E782 Mixed hyperlipidemia: Secondary | ICD-10-CM

## 2020-05-19 ENCOUNTER — Other Ambulatory Visit: Payer: Self-pay

## 2020-05-19 ENCOUNTER — Ambulatory Visit (INDEPENDENT_AMBULATORY_CARE_PROVIDER_SITE_OTHER): Payer: Medicare Other

## 2020-05-19 DIAGNOSIS — E875 Hyperkalemia: Secondary | ICD-10-CM

## 2020-05-19 NOTE — Progress Notes (Signed)
Patient presents to the office for a nurse visit to have labs done for elevated potassium levels. Vitals taken and recorded.

## 2020-05-20 LAB — BASIC METABOLIC PANEL WITH GFR
BUN/Creatinine Ratio: 12 (calc) (ref 6–22)
BUN: 20 mg/dL (ref 7–25)
CO2: 30 mmol/L (ref 20–32)
Calcium: 9.2 mg/dL (ref 8.6–10.3)
Chloride: 102 mmol/L (ref 98–110)
Creat: 1.67 mg/dL — ABNORMAL HIGH (ref 0.70–1.18)
GFR, Est African American: 46 mL/min/{1.73_m2} — ABNORMAL LOW (ref >=60)
GFR, Est Non African American: 40 mL/min/{1.73_m2} — ABNORMAL LOW (ref >=60)
Glucose, Bld: 84 mg/dL (ref 65–99)
Potassium: 5.1 mmol/L (ref 3.5–5.3)
Sodium: 139 mmol/L (ref 135–146)

## 2020-07-16 ENCOUNTER — Ambulatory Visit: Payer: Medicare Other | Attending: Internal Medicine

## 2020-07-16 ENCOUNTER — Other Ambulatory Visit (HOSPITAL_BASED_OUTPATIENT_CLINIC_OR_DEPARTMENT_OTHER): Payer: Self-pay

## 2020-07-16 ENCOUNTER — Other Ambulatory Visit: Payer: Self-pay

## 2020-07-16 DIAGNOSIS — Z23 Encounter for immunization: Secondary | ICD-10-CM

## 2020-07-16 MED ORDER — PFIZER-BIONT COVID-19 VAC-TRIS 30 MCG/0.3ML IM SUSP
INTRAMUSCULAR | 0 refills | Status: DC
  Filled 2020-07-16: qty 0.3, 1d supply, fill #0

## 2020-07-16 NOTE — Progress Notes (Signed)
   Covid-19 Vaccination Clinic  Name:  Jeffrey Carey    MRN: [id number] DOB: October 26, 1945  07/16/2020  [name] was observed post Covid-19 immunization for 15 minutes without incident. He was provided with Vaccine Information Sheet and instruction to access the V-Safe system.   [name] was instructed to call 911 with any severe reactions post vaccine: Difficulty breathing  Swelling of face and throat  A fast heartbeat  A bad rash all over body  Dizziness and weakness   Immunizations Administered     Name Date Dose VIS Date Route   PFIZER Comrnaty(Gray TOP) Covid-19 Vaccine 07/16/2020  1:02 PM 0.3 mL 12/12/2019 Intramuscular   Manufacturer: [place]   Lot: [id number]   [place]: [phone]

## 2020-08-05 ENCOUNTER — Ambulatory Visit (INDEPENDENT_AMBULATORY_CARE_PROVIDER_SITE_OTHER): Payer: Medicare Other | Admitting: Internal Medicine

## 2020-08-05 ENCOUNTER — Other Ambulatory Visit: Payer: Self-pay

## 2020-08-05 ENCOUNTER — Encounter: Payer: Self-pay | Admitting: Internal Medicine

## 2020-08-05 VITALS — BP 132/80 | HR 50 | Temp 97.6°F | Resp 16 | Ht 69.5 in | Wt 215.8 lb

## 2020-08-05 DIAGNOSIS — E559 Vitamin D deficiency, unspecified: Secondary | ICD-10-CM

## 2020-08-05 DIAGNOSIS — Z79899 Other long term (current) drug therapy: Secondary | ICD-10-CM | POA: Diagnosis not present

## 2020-08-05 DIAGNOSIS — N1832 Chronic kidney disease, stage 3b: Secondary | ICD-10-CM | POA: Diagnosis not present

## 2020-08-05 DIAGNOSIS — E782 Mixed hyperlipidemia: Secondary | ICD-10-CM | POA: Diagnosis not present

## 2020-08-05 DIAGNOSIS — R7309 Other abnormal glucose: Secondary | ICD-10-CM

## 2020-08-05 DIAGNOSIS — I10 Essential (primary) hypertension: Secondary | ICD-10-CM

## 2020-08-05 NOTE — Progress Notes (Signed)
Future Appointments  Date Time Provider [place]  08/05/2020 11:30 AM [name], MD [facility] None  02/10/2021 - CPE 11:00 AM [name], MD [facility] None  05/04/2021  - Wellness 10:00 AM [name], NP [facility] None    History of Present Illness:       This very nice 75 y.o. MWM presents for 6  month follow up with HTN, HLD, Pre-Diabetes and Vitamin D Deficiency.        Patient is treated for HTN (2006) & BP has been controlled at home. Today's BP is at goal - 132/80.  Patient has CKD3b (GFR 40). Patient has had no complaints of any cardiac type chest pain, palpitations, dyspnea / orthopnea / PND, dizziness, claudication, or dependent edema.       Hyperlipidemia is controlled with diet & meds. Patient denies myalgias or other med SE's. Last Lipids were at goal:  Lab Results  Component Value Date   CHOL 135 05/04/2020   HDL 36 (L) 05/04/2020   LDLCALC 76 05/04/2020   TRIG 148 05/04/2020   CHOLHDL 3.8 05/04/2020     Also, the patient has history of PreDiabetes (A1c 5.7% /2011) and has had no symptoms of reactive hypoglycemia, diabetic polys, paresthesias or visual blurring.  Last A1c was normal & at goal:  Lab Results  Component Value Date   HGBA1C 5.6 02/03/2020                                                         Further, the patient also has history of Vitamin D Deficiency and supplements vitamin D without any suspected side-effects. Last vitamin D was at goal:  Lab Results  Component Value Date   VD25OH 96 02/03/2020     Current Outpatient Medications on File Prior to Visit  Medication Sig   aspirin 81 MG tablet Take at bedtime.    atenolol 100 MG tablet TAKE 1 TABLET DAILY    CINNAMON 1,000 mg  Take  daily.   FLAXSEED OIL Take 1 capsule  daily.   losartan 25 MG tablet Take 1 tablet  daily.    Magnesium 250 MG TABS Take  daily   Omega-3 FISH OIL  1,200 mg Take at bedtime.    pravastatin  40 MG tablet TAKE 1 TABLET  AT  BEDTIME    VITAMIN D 5,000 Units Take  daily.     Allergies  Allergen Reactions   Ceftin [Cefuroxime Axetil] Hives   Peanut-Containing Drug Products Other (See Comments)    Break out on nose     PMHx:   Past Medical History:  Diagnosis Date   Chronic kidney disease    Dyslipidemia    ED (erectile dysfunction)    GERD (gastroesophageal reflux disease)    History of kidney stones    Hyperlipidemia    Hypertension    Radiculopathy 04/30/2014   Vitamin D deficiency      Immunization History  Administered Date(s) Administered   Influenza Whole 10/10/2012   Influenza, High Dose  10/09/2018, 10/16/2019   Influenza 11/04/2014   PFIZER Comirnaty Covid-19 Vacc 07/16/2020   PFIZER SARS-COV-2 Vacc 02/14/2019, 03/11/2019, 11/16/2019   Pneumococcal -13 02/05/2014   Pneumococcal -23 07/10/2012   Td 03/07/2006     Past Surgical History:  Procedure Laterality  Date   ANT CERVICAL DECOMP/DISCECTOMY FUSION N/A 04/30/2014   Procedure: ANT CERVICAL DECOMPRESSION/DISCECTOMY FUSION 1 LEVEL;  Surgeon: Phylliss Bob, MD;  Location: [facility];  Service: Orthopedics;  Laterality: N/A;  Anterior cervical decompression fusion, cervical 7-thoracic 1 with instrumentation and allograft   COLONOSCOPY     HAMMER TOE SURGERY  2012   left foot, 3rd toe   LAPAROSCOPIC CHOLECYSTECTOMY  10/2010    FHx:    Reviewed / unchanged  SHx:    Reviewed / unchanged   Systems Review:  Constitutional: Denies fever, chills, wt changes, headaches, insomnia, fatigue, night sweats, change in appetite. Eyes: Denies redness, blurred vision, diplopia, discharge, itchy, watery eyes.  ENT: Denies discharge, congestion, post nasal drip, epistaxis, sore throat, earache, hearing loss, dental pain, tinnitus, vertigo, sinus pain, snoring.  CV: Denies chest pain, palpitations, irregular heartbeat, syncope, dyspnea, diaphoresis, orthopnea, PND, claudication or edema. Respiratory: denies cough, dyspnea, DOE, pleurisy, hoarseness,  laryngitis, wheezing.  Gastrointestinal: Denies dysphagia, odynophagia, heartburn, reflux, water brash, abdominal pain or cramps, nausea, vomiting, bloating, diarrhea, constipation, hematemesis, melena, hematochezia  or hemorrhoids. Genitourinary: Denies dysuria, frequency, urgency, nocturia, hesitancy, discharge, hematuria or flank pain. Musculoskeletal: Denies arthralgias, myalgias, stiffness, jt. swelling, pain, limping or strain/sprain.  Skin: Denies pruritus, rash, hives, warts, acne, eczema or change in skin lesion(s). Neuro: No weakness, tremor, incoordination, spasms, paresthesia or pain. Psychiatric: Denies confusion, memory loss or sensory loss. Endo: Denies change in weight, skin or hair change.  Heme/Lymph: No excessive bleeding, bruising or enlarged lymph nodes.  Physical Exam  BP 132/80   Pulse (!) 50   Temp 97.6 F (36.4 C)   Resp 16   Ht 5' 9.5" (1.765 m)   Wt 215 lb 12.8 oz (97.9 kg)   SpO2 98%   BMI 31.41 kg/m   Appears  well nourished, well groomed  and in no distress.  Eyes: PERRLA, EOMs, conjunctiva no swelling or erythema. Sinuses: No frontal/maxillary tenderness ENT/Mouth: EAC's clear, TM's nl w/o erythema, bulging. Nares clear w/o erythema, swelling, exudates. Oropharynx clear without erythema or exudates. Oral hygiene is good. Tongue normal, non obstructing. Hearing intact.  Neck: Supple. Thyroid not palpable. Car 2+/2+ without bruits, nodes or JVD. Chest: Respirations nl with BS clear & equal w/o rales, rhonchi, wheezing or stridor.  Cor: Heart sounds normal w/ regular rate and rhythm without sig. murmurs, gallops, clicks or rubs. Peripheral pulses normal and equal  without edema.  Abdomen: Soft & bowel sounds normal. Non-tender w/o guarding, rebound, hernias, masses or organomegaly.  Lymphatics: Unremarkable.  Musculoskeletal: Full ROM all peripheral extremities, joint stability, 5/5 strength and normal gait.  Skin: Warm, dry without exposed rashes,  lesions or ecchymosis apparent.  Neuro: Cranial nerves intact, reflexes equal bilaterally. Sensory-motor testing grossly intact. Tendon reflexes grossly intact.  Pysch: Alert & oriented x 3.  Insight and judgement nl & appropriate. No ideations.  Assessment and Plan:  1. Essential hypertension  - Continue medication, monitor blood pressure at home.  - Continue DASH diet.  Reminder to go to the ER if any CP,  SOB, nausea, dizziness, severe HA, changes vision/speech.    - CBC with Differential/Platelet - COMPLETE METABOLIC PANEL WITH GFR - Magnesium - TSH  2. Hyperlipidemia, mixed  - Continue diet/meds, exercise,& lifestyle modifications.  - Continue monitor periodic cholesterol/liver & renal functions     - Lipid panel - TSH  3. Abnormal glucose  - Continue diet, exercise  - Lifestyle modifications.  - Monitor appropriate labs   - Hemoglobin A1c -  Insulin, random  4. Stage 3b chronic kidney disease (HCC)  - COMPLETE METABOLIC PANEL WITH GFR  5. Vitamin D deficiency  - Continue supplementation    - VITAMIN D 25 Hydroxy  6. Medication management  - CBC with Differential/Platelet - COMPLETE METABOLIC PANEL WITH GFR - Magnesium - Lipid panel - TSH - Hemoglobin A1c - Insulin, random - VITAMIN D 25 Hydroxy        Discussed  regular exercise, BP monitoring, weight control to achieve/maintain BMI less than 25 and discussed med and SE's. Recommended labs to assess and monitor clinical status with further disposition pending results of labs.  I discussed the assessment and treatment plan with the patient. The patient was provided an opportunity to ask questions and all were answered. The patient agreed with the plan and demonstrated an understanding of the instructions.  I provided over 30 minutes of exam, counseling, chart review and  complex critical decision making.        The patient was advised to call back or seek an in-person evaluation if the symptoms worsen or  if the condition fails to improve as anticipated.   Kirtland Bouchard, MD

## 2020-08-05 NOTE — Patient Instructions (Signed)

## 2020-08-06 LAB — LIPID PANEL
Cholesterol: 125 mg/dL (ref <200)
HDL: 34 mg/dL — ABNORMAL LOW (ref >=40)
LDL Cholesterol (Calc): 68 mg/dL (calc)
Non-HDL Cholesterol (Calc): 91 mg/dL (calc) (ref <130)
Total CHOL/HDL Ratio: 3.7 (calc) (ref <5.0)
Triglycerides: 157 mg/dL — ABNORMAL HIGH (ref <150)

## 2020-08-06 LAB — COMPLETE METABOLIC PANEL WITH GFR
AG Ratio: 2 (calc) (ref 1.0–2.5)
ALT: 18 U/L (ref 9–46)
AST: 18 U/L (ref 10–35)
Albumin: 4.4 g/dL (ref 3.6–5.1)
Alkaline phosphatase (APISO): 58 U/L (ref 35–144)
BUN/Creatinine Ratio: 15 (calc) (ref 6–22)
BUN: 24 mg/dL (ref 7–25)
CO2: 26 mmol/L (ref 20–32)
Calcium: 9.3 mg/dL (ref 8.6–10.3)
Chloride: 106 mmol/L (ref 98–110)
Creat: 1.61 mg/dL — ABNORMAL HIGH (ref 0.70–1.28)
Globulin: 2.2 g/dL (calc) (ref 1.9–3.7)
Glucose, Bld: 122 mg/dL — ABNORMAL HIGH (ref 65–99)
Potassium: 4.5 mmol/L (ref 3.5–5.3)
Sodium: 139 mmol/L (ref 135–146)
Total Bilirubin: 0.7 mg/dL (ref 0.2–1.2)
Total Protein: 6.6 g/dL (ref 6.1–8.1)
eGFR: 45 mL/min/{1.73_m2} — ABNORMAL LOW (ref >=60)

## 2020-08-06 LAB — CBC WITH DIFFERENTIAL/PLATELET
Absolute Monocytes: 409 cells/uL (ref 200–950)
Basophils Absolute: 40 cells/uL (ref 0–200)
Basophils Relative: 0.6 %
Eosinophils Absolute: 241 cells/uL (ref 15–500)
Eosinophils Relative: 3.6 %
HCT: 48.5 % (ref 38.5–50.0)
Hemoglobin: 16.4 g/dL (ref 13.2–17.1)
Lymphs Abs: 1762 cells/uL (ref 850–3900)
MCH: 29.9 pg (ref 27.0–33.0)
MCHC: 33.8 g/dL (ref 32.0–36.0)
MCV: 88.5 fL (ref 80.0–100.0)
MPV: 9 fL (ref 7.5–12.5)
Monocytes Relative: 6.1 %
Neutro Abs: 4248 cells/uL (ref 1500–7800)
Neutrophils Relative %: 63.4 %
Platelets: 277 10*3/uL (ref 140–400)
RBC: 5.48 10*6/uL (ref 4.20–5.80)
RDW: 13.1 % (ref 11.0–15.0)
Total Lymphocyte: 26.3 %
WBC: 6.7 10*3/uL (ref 3.8–10.8)

## 2020-08-06 LAB — MAGNESIUM: Magnesium: 2.2 mg/dL (ref 1.5–2.5)

## 2020-08-06 LAB — TSH: TSH: 2.01 mIU/L (ref 0.40–4.50)

## 2020-08-06 LAB — HEMOGLOBIN A1C
Hgb A1c MFr Bld: 5.5 % of total Hgb (ref <5.7)
Mean Plasma Glucose: 111 mg/dL
eAG (mmol/L): 6.2 mmol/L

## 2020-08-06 LAB — INSULIN, RANDOM: Insulin: 160.6 u[IU]/mL — ABNORMAL HIGH

## 2020-08-06 LAB — VITAMIN D 25 HYDROXY (VIT D DEFICIENCY, FRACTURES): Vit D, 25-Hydroxy: 99 ng/mL (ref 30–100)

## 2020-08-07 NOTE — Progress Notes (Signed)
============================================================ -   Test results slightly outside the reference range are not unusual. If there is anything important, I will review this with you,  otherwise it is considered normal test values.  If you have further questions,  please do not hesitate to contact me at the office or via My Chart.  ============================================================ ============================================================  -  Kidney functions  (GFR 45 ) & Stable over the last 6 years  ============================================================ ============================================================  -  Total Chol = 125    &   LDL Chol = 68 -  Excellent   - Very low risk for Heart Attack  / Stroke ============================================================ ============================================================  -  A1c - Normal - Great - No Diabetes  ! ============================================================ ============================================================  -  But, Insulin level is elevated = 160.6              ( Normal is less than 20  !  )   - Elevated Insulin shows insulin resistance - a sign of early diabetes and  associated with a 300 % greater risk for heart attacks, strokes, cancer &  Alzheimer type vascular dementia   - All this can be cured  and prevented with losing weight   - get Dr Fara Olden Fuhrman's book 'the End of Diabetes" and "the End of Dieting"    - and add many years of good health to your life. ============================================================ ============================================================  -  All Else - CBC - Electrolytes - Liver - Magnesium & Thyroid    - all  Normal / OK ============================================================ ============================================================

## 2020-08-09 ENCOUNTER — Encounter: Payer: Self-pay | Admitting: Internal Medicine

## 2020-09-16 DIAGNOSIS — K219 Gastro-esophageal reflux disease without esophagitis: Secondary | ICD-10-CM | POA: Diagnosis not present

## 2020-09-16 DIAGNOSIS — R001 Bradycardia, unspecified: Secondary | ICD-10-CM | POA: Diagnosis not present

## 2020-09-16 DIAGNOSIS — N1832 Chronic kidney disease, stage 3b: Secondary | ICD-10-CM | POA: Diagnosis not present

## 2020-09-16 DIAGNOSIS — I129 Hypertensive chronic kidney disease with stage 1 through stage 4 chronic kidney disease, or unspecified chronic kidney disease: Secondary | ICD-10-CM | POA: Diagnosis not present

## 2020-10-01 ENCOUNTER — Other Ambulatory Visit: Payer: Self-pay | Admitting: Adult Health

## 2020-10-08 ENCOUNTER — Other Ambulatory Visit: Payer: Self-pay

## 2020-10-08 ENCOUNTER — Ambulatory Visit (INDEPENDENT_AMBULATORY_CARE_PROVIDER_SITE_OTHER): Payer: Medicare Other

## 2020-10-08 VITALS — Temp 97.8°F

## 2020-10-08 DIAGNOSIS — Z23 Encounter for immunization: Secondary | ICD-10-CM

## 2020-11-03 NOTE — Progress Notes (Signed)
3 MONTH FOLLOW UP Assessment:    Essential hypertension Monitor blood pressure at home; call if consistently over 130/80 Continue DASH diet.   Reminder to go to the ER if any CP, SOB, nausea, dizziness, severe HA, changes vision/speech, left arm numbness and tingling and jaw pain.  Gastroesophageal reflux disease, esophagitis presence not specified Well managed on current medications, PPI PRN <2/week Discussed diet, avoiding triggers and other lifestyle changes  Vitamin D deficiency At goal at recent check; continue to recommend supplementation for goal of 70-100 Defer vitamin D level  Other abnormal glucose Recent A1Cs at goal Discussed diet/exercise, weight management  Defer A1C; check CMP  Obesity (BMI 30.0-34.9) Long discussion about weight loss, diet, and exercise Recommended diet heavy in fruits and veggies and low in animal meats, cheeses, and dairy products, appropriate calorie intake Patient will work on cutting sugar, watching portions, increase walking Discussed appropriate weight for height and initial goal (<200 lb) Follow up at next visit  Medication management CBC, CMP/GFR, magnesium  Hyperlipidemia Continue medications: pravastatin 40 mg daily Continue low cholesterol diet and exercise.  Check lipid panel.   CKD (chronic kidney disease) stage 3b, GFR 30-44 ml/min (HCC) Increase fluids, avoid NSAIDS, monitor sugars, has been stable, will monitor CMP each visit Nephrology following annually  Erectile dysfunction Sildenafil PRN works well, uses rarely    Over 30 minutes of exam, counseling, chart review, and critical decision making was performed  Future Appointments  Date Time Provider [place]  02/10/2021 11:00 AM [name], MD [facility] None  05/04/2021 10:00 AM [name], NP [facility] None    Subjective:  Jeffrey Carey is a 75 y.o. male who presents for 3 month follow up for HTN, hyperlipidemia, glucose management, and  vitamin D Def.   he has a diagnosis of GERD which is currently managed by omeprazole 20 mg PRN, <2/week he reports symptoms is currently well controlled, and denies breakthrough reflux, burning in chest, hoarseness or cough.    BMI is Body mass index is 31.7 kg/m., he has been working on diet and exercise, admits has been snacking too much, working in yard, [employer] several, plans to restart walking with he wife. He does weigh himself regularly.  Wt Readings from Last 3 Encounters:  11/04/20 217 lb 12.8 oz (98.8 kg)  08/05/20 215 lb 12.8 oz (97.9 kg)  05/19/20 218 lb (98.9 kg)   His blood pressure has been controlled at home (130s/low 80s), today their BP is BP: 130/80 He does not workout. He denies chest pain, shortness of breath, dizziness.   He is on cholesterol medication (pravastatin 40 mg daily) and denies myalgias. His cholesterol is at goal. The cholesterol last visit was:   Lab Results  Component Value Date   CHOL 125 08/05/2020   HDL 34 (L) 08/05/2020   LDLCALC 68 08/05/2020   TRIG 157 (H) 08/05/2020   CHOLHDL 3.7 08/05/2020   He has been working on diet and exercise for history of prediabetes (last elevation 5.7% in 2017), and denies increased appetite, nausea, paresthesia of the feet, polydipsia, polyuria, visual disturbances, vomiting and weight loss. Last A1C in the office was:  Lab Results  Component Value Date   HGBA1C 5.5 08/05/2020   He has CKD IIIb without albuminuria monitored at nephrology, Dr. Joylene Grapes recently released to annual follow up, getting 3-4 bottles of water in daily:  Lab Results  Component Value Date   GFRNONAA 40 (L) 05/19/2020   GFRNONAA 42 (L) 05/04/2020   GFRNONAA  36 (L) 02/03/2020    Patient is on Vitamin D supplement.   Lab Results  Component Value Date   VD25OH 99 08/05/2020      Medication Review:   Current Outpatient Medications (Cardiovascular):    atenolol (TENORMIN) 100 MG tablet, TAKE 1 TABLET BY MOUTH DAILY FOR BLOOD PRESSURE    losartan (COZAAR) 25 MG tablet, TAKE 1 TABLET (25 MG TOTAL) BY MOUTH DAILY. FOR BLOOD PRESSURE AND KIDNEY PROTECTION.   pravastatin (PRAVACHOL) 40 MG tablet, TAKE 1 TABLET BY MOUTH EVERY DAY AT BEDTIME FOR CHOLESTEROL   Current Outpatient Medications (Analgesics):    aspirin 81 MG tablet, Take 81 mg by mouth at bedtime.    Current Outpatient Medications (Other):    CINNAMON PO, Take 1,000 mg by mouth daily.   COVID-19 mRNA Vac-TriS, Pfizer, (PFIZER-BIONT COVID-19 VAC-TRIS) SUSP injection, Inject into the muscle.   Flaxseed, Linseed, (FLAXSEED OIL PO), Take 1 capsule by mouth daily.   gabapentin (NEURONTIN) 300 MG capsule, Take 1 to 3 capsules at night for Pain   Magnesium 250 MG TABS, Take by mouth.   Omega-3 Fatty Acids (FISH OIL PO), Take 1,200 mg by mouth at bedtime.    trimethoprim-polymyxin b (POLYTRIM) ophthalmic solution, PLACE 1 DROP INTO BOTH EYES EVERY 4 (FOUR) HOURS. FOR 10 DAYS.   VITAMIN D PO, Take 5,000 Units by mouth daily.  Allergies: Allergies  Allergen Reactions   Ceftin [Cefuroxime Axetil] Hives   Peanut-Containing Drug Products Other (See Comments)    Break out on nose     Current Problems (verified) has History of colonic polyps; Essential hypertension; Hyperlipidemia, mixed; Abnormal glucose; Vitamin D deficiency; Gastroesophageal reflux disease; Obesity (BMI 30.0-34.9); CKD (chronic kidney disease) stage 3, GFR 30-59 ml/min ([place]); History of prediabetes; and ED (erectile dysfunction) on their problem list.  Surgical: He  has a past surgical history that includes Laparoscopic cholecystectomy (10/2010); Hammer toe surgery (2012); Anterior cervical decomp/discectomy fusion (N/A, 04/30/2014); and Colonoscopy. Family His family history includes Alzheimer's disease in his father; Colon cancer (age of onset: 51) in his father and mother; Diabetes in his father; Ovarian cancer in his mother; Prostate cancer in his father. Social history  He reports that he has never  smoked. He has never used smokeless tobacco. He reports that he does not drink alcohol and does not use drugs.  Review of Systems  Constitutional:  Negative for malaise/fatigue and weight loss.  HENT:  Negative for hearing loss and tinnitus.   Eyes:  Negative for blurred vision and double vision.  Respiratory:  Negative for cough, shortness of breath and wheezing.   Cardiovascular:  Negative for chest pain, palpitations, orthopnea, claudication and leg swelling.  Gastrointestinal:  Negative for abdominal pain, blood in stool, constipation, diarrhea, heartburn, melena, nausea and vomiting.  Genitourinary: Negative.   Musculoskeletal:  Negative for joint pain and myalgias.  Skin:  Negative for rash.  Neurological:  Negative for dizziness, tingling, sensory change, weakness and headaches.  Endo/Heme/Allergies:  Negative for polydipsia.  Psychiatric/Behavioral: Negative.    All other systems reviewed and are negative.   Objective:   Today's Vitals   11/04/20 1108  BP: 130/80  Pulse: (!) 57  Temp: (!) 96.4 F (35.8 C)  SpO2: 99%  Weight: 217 lb 12.8 oz (98.8 kg)    Body mass index is 31.7 kg/m.  General appearance: alert, no distress, WD/WN, male HEENT: normocephalic, sclerae anicteric, TMs pearly, nares patent, no discharge or erythema, pharynx normal Oral cavity: MMM, no lesions Neck: supple, no  lymphadenopathy, no thyromegaly, no masses Heart: RRR, normal S1, S2, no murmurs Lungs: CTA bilaterally, no wheezes, rhonchi, or rales Abdomen: +bs, soft, non tender, non distended, no masses, no hepatomegaly, no splenomegaly Musculoskeletal: nontender, no swelling, no obvious deformity.  Extremities: no edema, no cyanosis, no clubbing Pulses: 2+ symmetric, upper and lower extremities, normal cap refill Neurological: alert, oriented x 3, CN2-12 intact, strength normal upper extremities and lower extremities, sensation normal throughout, DTRs 2+ throughout, no cerebellar signs, gait  normal Psychiatric: normal affect, behavior normal, pleasant    Izora Ribas, NP   11/04/2020

## 2020-11-04 ENCOUNTER — Other Ambulatory Visit: Payer: Self-pay

## 2020-11-04 ENCOUNTER — Ambulatory Visit (INDEPENDENT_AMBULATORY_CARE_PROVIDER_SITE_OTHER): Payer: Medicare Other | Admitting: Adult Health

## 2020-11-04 ENCOUNTER — Encounter: Payer: Self-pay | Admitting: Adult Health

## 2020-11-04 VITALS — BP 130/80 | HR 57 | Temp 96.4°F | Wt 217.8 lb

## 2020-11-04 DIAGNOSIS — E559 Vitamin D deficiency, unspecified: Secondary | ICD-10-CM

## 2020-11-04 DIAGNOSIS — N1832 Chronic kidney disease, stage 3b: Secondary | ICD-10-CM

## 2020-11-04 DIAGNOSIS — R7309 Other abnormal glucose: Secondary | ICD-10-CM

## 2020-11-04 DIAGNOSIS — E782 Mixed hyperlipidemia: Secondary | ICD-10-CM

## 2020-11-04 DIAGNOSIS — I10 Essential (primary) hypertension: Secondary | ICD-10-CM | POA: Diagnosis not present

## 2020-11-04 DIAGNOSIS — E669 Obesity, unspecified: Secondary | ICD-10-CM

## 2020-11-04 NOTE — Patient Instructions (Signed)
Goals      Blood Pressure < 130/80     Weight (lb) < 200 lb (90.7 kg)         High-Fiber Eating Plan Fiber, also called dietary fiber, is a type of carbohydrate. It is found foods such as fruits, vegetables, whole grains, and beans. A high-fiber diet can have many health benefits. Your health care provider may recommend a high-fiber diet to help: Prevent constipation. Fiber can make your bowel movements more regular. Lower your cholesterol. Relieve the following conditions: Inflammation of veins in the anus (hemorrhoids). Inflammation of specific areas of the digestive tract (uncomplicated diverticulosis). A problem of the large intestine, also called the colon, that sometimes causes pain and diarrhea (irritable bowel syndrome, or IBS). Prevent overeating as part of a weight-loss plan. Prevent heart disease, type 2 diabetes, and certain cancers. What are tips for following this plan? Reading food labels  Check the nutrition facts label on food products for the amount of dietary fiber. Choose foods that have 5 grams of fiber or more per serving. The goals for recommended daily fiber intake include: Men (age 50 or younger): 34-38 g. Men (over age 76): 28-34 g. Women (age 77 or younger): 25-28 g. Women (over age 22): 22-25 g. Your daily fiber goal is _____________ g. Shopping Choose whole fruits and vegetables instead of processed forms, such as apple juice or applesauce. Choose a wide variety of high-fiber foods such as avocados, lentils, oats, and kidney beans. Read the nutrition facts label of the foods you choose. Be aware of foods with added fiber. These foods often have high sugar and sodium amounts per serving. Cooking Use whole-grain flour for baking and cooking. Cook with brown rice instead of white rice. Meal planning Start the day with a breakfast that is high in fiber, such as a cereal that contains 5 g of fiber or more per serving. Eat breads and cereals that are made  with whole-grain flour instead of refined flour or white flour. Eat brown rice, bulgur wheat, or millet instead of white rice. Use beans in place of meat in soups, salads, and pasta dishes. Be sure that half of the grains you eat each day are whole grains. General information You can get the recommended daily intake of dietary fiber by: Eating a variety of fruits, vegetables, grains, nuts, and beans. Taking a fiber supplement if you are not able to take in enough fiber in your diet. It is better to get fiber through food than from a supplement. Gradually increase how much fiber you consume. If you increase your intake of dietary fiber too quickly, you may have bloating, cramping, or gas. Drink plenty of water to help you digest fiber. Choose high-fiber snacks, such as berries, raw vegetables, nuts, and popcorn. What foods should I eat? Fruits Berries. Pears. Apples. Oranges. Avocado. Prunes and raisins. Dried figs. Vegetables Sweet potatoes. Spinach. Kale. Artichokes. Cabbage. Broccoli. Cauliflower. Green peas. Carrots. Squash. Grains Whole-grain breads. Multigrain cereal. Oats and oatmeal. Brown rice. Barley. Bulgur wheat. [place]. Quinoa. Bran muffins. Popcorn. Rye wafer crackers. Meats and other proteins Navy beans, kidney beans, and pinto beans. Soybeans. Split peas. Lentils. Nuts and seeds. Dairy Fiber-fortified yogurt. Beverages Fiber-fortified soy milk. Fiber-fortified orange juice. Other foods Fiber bars. The items listed above may not be a complete list of recommended foods and beverages. Contact a dietitian for more information. What foods should I avoid? Fruits Fruit juice. Cooked, strained fruit. Vegetables Fried potatoes. Canned vegetables. Well-cooked vegetables. Grains White bread.  Pasta made with refined flour. White rice. Meats and other proteins Fatty cuts of meat. Fried chicken or fried fish. Dairy Milk. Yogurt. Cream cheese. Sour cream. Fats and  oils Butters. Beverages Soft drinks. Other foods Cakes and pastries. The items listed above may not be a complete list of foods and beverages to avoid. Talk with your dietitian about what choices are best for you. Summary Fiber is a type of carbohydrate. It is found in foods such as fruits, vegetables, whole grains, and beans. A high-fiber diet has many benefits. It can help to prevent constipation, lower blood cholesterol, aid weight loss, and reduce your risk of heart disease, diabetes, and certain cancers. Increase your intake of fiber gradually. Increasing fiber too quickly may cause cramping, bloating, and gas. Drink plenty of water while you increase the amount of fiber you consume. The best sources of fiber include whole fruits and vegetables, whole grains, nuts, seeds, and beans. This information is not intended to replace advice given to you by your health care provider. Make sure you discuss any questions you have with your health care provider. Document Revised: 04/25/2019 Document Reviewed: 04/25/2019 Elsevier Patient Education  2022 [employer].

## 2020-11-05 ENCOUNTER — Other Ambulatory Visit: Payer: Self-pay | Admitting: Adult Health

## 2020-11-05 DIAGNOSIS — E875 Hyperkalemia: Secondary | ICD-10-CM

## 2020-11-05 LAB — CBC WITH DIFFERENTIAL/PLATELET
Absolute Monocytes: 690 cells/uL (ref 200–950)
Basophils Absolute: 68 cells/uL (ref 0–200)
Basophils Relative: 0.9 %
Eosinophils Absolute: 270 cells/uL (ref 15–500)
Eosinophils Relative: 3.6 %
HCT: 49.3 % (ref 38.5–50.0)
Hemoglobin: 16.8 g/dL (ref 13.2–17.1)
Lymphs Abs: 1845 cells/uL (ref 850–3900)
MCH: 30.1 pg (ref 27.0–33.0)
MCHC: 34.1 g/dL (ref 32.0–36.0)
MCV: 88.4 fL (ref 80.0–100.0)
MPV: 9 fL (ref 7.5–12.5)
Monocytes Relative: 9.2 %
Neutro Abs: 4628 cells/uL (ref 1500–7800)
Neutrophils Relative %: 61.7 %
Platelets: 280 10*3/uL (ref 140–400)
RBC: 5.58 10*6/uL (ref 4.20–5.80)
RDW: 12.5 % (ref 11.0–15.0)
Total Lymphocyte: 24.6 %
WBC: 7.5 10*3/uL (ref 3.8–10.8)

## 2020-11-05 LAB — COMPLETE METABOLIC PANEL WITH GFR
AG Ratio: 1.8 (calc) (ref 1.0–2.5)
ALT: 18 U/L (ref 9–46)
AST: 20 U/L (ref 10–35)
Albumin: 4.5 g/dL (ref 3.6–5.1)
Alkaline phosphatase (APISO): 60 U/L (ref 35–144)
BUN/Creatinine Ratio: 14 (calc) (ref 6–22)
BUN: 25 mg/dL (ref 7–25)
CO2: 24 mmol/L (ref 20–32)
Calcium: 10.3 mg/dL (ref 8.6–10.3)
Chloride: 104 mmol/L (ref 98–110)
Creat: 1.75 mg/dL — ABNORMAL HIGH (ref 0.70–1.28)
Globulin: 2.5 g/dL (calc) (ref 1.9–3.7)
Glucose, Bld: 74 mg/dL (ref 65–99)
Potassium: 5.6 mmol/L — ABNORMAL HIGH (ref 3.5–5.3)
Sodium: 140 mmol/L (ref 135–146)
Total Bilirubin: 0.5 mg/dL (ref 0.2–1.2)
Total Protein: 7 g/dL (ref 6.1–8.1)
eGFR: 40 mL/min/{1.73_m2} — ABNORMAL LOW (ref >=60)

## 2020-11-05 LAB — LIPID PANEL
Cholesterol: 123 mg/dL (ref <200)
HDL: 34 mg/dL — ABNORMAL LOW (ref >=40)
LDL Cholesterol (Calc): 65 mg/dL (calc)
Non-HDL Cholesterol (Calc): 89 mg/dL (calc) (ref <130)
Total CHOL/HDL Ratio: 3.6 (calc) (ref <5.0)
Triglycerides: 165 mg/dL — ABNORMAL HIGH (ref <150)

## 2020-11-05 LAB — MAGNESIUM: Magnesium: 2.4 mg/dL (ref 1.5–2.5)

## 2020-11-05 LAB — TSH: TSH: 1.89 mIU/L (ref 0.40–4.50)

## 2020-11-17 ENCOUNTER — Other Ambulatory Visit: Payer: Self-pay

## 2020-11-17 ENCOUNTER — Ambulatory Visit (INDEPENDENT_AMBULATORY_CARE_PROVIDER_SITE_OTHER): Payer: Medicare Other

## 2020-11-17 DIAGNOSIS — E875 Hyperkalemia: Secondary | ICD-10-CM

## 2020-11-17 LAB — BASIC METABOLIC PANEL WITH GFR
BUN/Creatinine Ratio: 13 (calc) (ref 6–22)
BUN: 20 mg/dL (ref 7–25)
CO2: 30 mmol/L (ref 20–32)
Calcium: 9.2 mg/dL (ref 8.6–10.3)
Chloride: 104 mmol/L (ref 98–110)
Creat: 1.59 mg/dL — ABNORMAL HIGH (ref 0.70–1.28)
Glucose, Bld: 88 mg/dL (ref 65–99)
Potassium: 5.5 mmol/L — ABNORMAL HIGH (ref 3.5–5.3)
Sodium: 140 mmol/L (ref 135–146)
eGFR: 45 mL/min/{1.73_m2} — ABNORMAL LOW (ref >=60)

## 2020-11-17 NOTE — Progress Notes (Signed)
Patient presents to the office for a nurse visit to have labs done to check potassium levels. No questions or concerns. Vitals taken and recorded.  °

## 2020-12-11 DIAGNOSIS — H2513 Age-related nuclear cataract, bilateral: Secondary | ICD-10-CM | POA: Diagnosis not present

## 2020-12-11 DIAGNOSIS — H02831 Dermatochalasis of right upper eyelid: Secondary | ICD-10-CM | POA: Diagnosis not present

## 2020-12-11 DIAGNOSIS — H11122 Conjunctival concretions, left eye: Secondary | ICD-10-CM | POA: Diagnosis not present

## 2020-12-11 DIAGNOSIS — H16223 Keratoconjunctivitis sicca, not specified as Sjogren's, bilateral: Secondary | ICD-10-CM | POA: Diagnosis not present

## 2020-12-11 DIAGNOSIS — H02834 Dermatochalasis of left upper eyelid: Secondary | ICD-10-CM | POA: Diagnosis not present

## 2020-12-11 DIAGNOSIS — H0102A Squamous blepharitis right eye, upper and lower eyelids: Secondary | ICD-10-CM | POA: Diagnosis not present

## 2020-12-11 DIAGNOSIS — H1045 Other chronic allergic conjunctivitis: Secondary | ICD-10-CM | POA: Diagnosis not present

## 2020-12-11 DIAGNOSIS — H0102B Squamous blepharitis left eye, upper and lower eyelids: Secondary | ICD-10-CM | POA: Diagnosis not present

## 2021-02-09 ENCOUNTER — Encounter: Payer: Self-pay | Admitting: Internal Medicine

## 2021-02-09 NOTE — Patient Instructions (Signed)

## 2021-02-09 NOTE — Progress Notes (Signed)
Annual  Screening/Preventative Visit  & Comprehensive Evaluation & Examination  Future Appointments  Date Time Provider Department  02/10/2021             CPE 11:00 AM [name], MD [facility]  05/04/2021              Wellness 10:00 AM [name], NP [facility]  02/16/2022           CPE 11:00 AM [name], MD [facility]            This very nice 76 y.o.  recently widowed WM presents for a Screening /Preventative Visit & comprehensive evaluation and management of multiple medical co-morbidities.  Patient has been followed for HTN, HLD, Prediabetes and Vitamin D Deficiency.       HTN predates circa 2006. Patient's BP has been controlled at home.  Patient has Stage 3b CKD  (GFR 39) .  Today's BP is at goal - 138/76. Patient denies any cardiac symptoms as chest pain, palpitations, shortness of breath, dizziness or ankle swelling.       Patient's hyperlipidemia is controlled with diet and Pravastatin.  Patient denies myalgias or other medication SE's. Last lipids were at goal except sl elevated  Trig's : .                                                                                                                                                                                                                                                         Lab Results  Component Value Date   CHOL 123 11/04/2020   HDL 34 (L) 11/04/2020   LDLCALC 65 11/04/2020   TRIG 165 (H) 11/04/2020   CHOLHDL 3.6 11/04/2020        Patient has hx/o prediabetes (A1c 5.7% /2011) and patient denies reactive hypoglycemic symptoms, visual blurring, diabetic polys or paresthesias. Last A1c was normal & at goal :   Lab Results  Component Value Date   HGBA1C 5.5 08/05/2020         Finally, patient has history of Vitamin D Deficiency ("24" /2008) and last vitamin D was   Lab Results  Component Value Date   VD25OH 99 08/05/2020     Current Outpatient Medications on File Prior to Visit   Medication Sig   aspirin 81 MG tablet  Take  at bedtime.    atenolol (TENORMIN) 100 MG tablet TAKE 1 TABLET DAILY    CINNAMON 1,000 mg  Take daily.   Flaxseed, Linseed, (FLAXSEED OIL PO) Take 1 capsule  daily.   gabapentin (NEURONTIN) 300 MG capsule Take 1 to 3 capsules for Pain   losartan (COZAAR) 25 MG tablet TAKE 1 TABLET DAILY   Magnesium 250 MG TABS Take    Omega-3 FISH OIL  1,200 mg  Take t bedtime.    pravastatin 40 MG tablet TAKE 1 TABLET  EVERY DAY    VITAMIN D PO Take 5,000 Units daily.     Allergies  Allergen Reactions   Ceftin [Cefuroxime Axetil] Hives   Peanut-Containing Drug Products Break out on nose          Past Medical History:  Diagnosis Date   Chronic kidney disease    Dyslipidemia    ED (erectile dysfunction)    GERD (gastroesophageal reflux disease)    History of kidney stones    Hyperlipidemia    Hypertension    Radiculopathy 04/30/2014   Vitamin D deficiency      Health Maintenance  Topic Date Due   Zoster Vaccines- Shingrix (1 of 2) Never done   COVID-19 Vaccine (5 - Booster for Pfizer series) 09/10/2020   TETANUS/TDAP  05/04/2021 (Originally 06/14/2016)   COLONOSCOPY 06/28/2022   Pneumonia Vaccine 5+ Years old  Completed   INFLUENZA VACCINE  Completed   Hepatitis C Screening  Completed   HPV VACCINES  Aged Out     Immunization History  Administered Date(s) Administered   Influenza Whole 10/10/2012   Influenza, High Dose  10/09/2018, 10/16/2019, 10/08/2020   Influenza 11/04/2014   PFIZER Comirnaty Cov-19 Vacc 07/16/2020   PFIZER  SARS-COV-2 Vacc 02/14/2019, 03/11/2019, 11/16/2019   Pneumococcal -13 02/05/2014   Pneumococcal -23 07/10/2012   Td 03/07/2006    Last Colon  -  04/29/2016 - Dr [name] - recc 5 year f/u due Apr 2023    Past Surgical History:  Procedure Laterality Date   ANTERIOR CERVICAL DECOMP/DISCECTOMY FUSION N/A 04/30/2014   Procedure: ANTERIOR CERVICAL DECOMPRESSION/DISCECTOMY FUSION 1 LEVEL;  Surgeon: Phylliss Bob, MD;  Location: [facility];  Service: Orthopedics;  Laterality: N/A;  Anterior cervical decompression fusion, cervical 7-thoracic 1 with instrumentation and allograft   COLONOSCOPY     HAMMER TOE SURGERY  2012   left foot, 3rd toe   LAPAROSCOPIC CHOLECYSTECTOMY  10/2010     Family History  Problem Relation Age of Onset   Colon cancer Mother 10   Ovarian cancer Mother    Colon cancer Father 3   Diabetes Father    Alzheimer's disease Father    Prostate cancer Father    Stomach cancer Neg Hx    Colon polyps Neg Hx    Esophageal cancer Neg Hx    Rectal cancer Neg Hx      Social History   Tobacco Use   Smoking status: Never   Smokeless tobacco: Never  Vaping Use   Vaping Use: Never used  Substance Use Topics   Alcohol use: No   Drug use: No      ROS Constitutional: Denies fever, chills, weight loss/gain, headaches, insomnia,  night sweats or change in appetite. Does c/o fatigue. Eyes: Denies redness, blurred vision, diplopia, discharge, itchy or watery eyes.  ENT: Denies discharge, congestion, post nasal drip, epistaxis, sore throat, earache, hearing loss, dental pain, Tinnitus, Vertigo, Sinus pain or snoring.  Cardio: Denies chest pain, palpitations,  irregular heartbeat, syncope, dyspnea, diaphoresis, orthopnea, PND, claudication or edema Respiratory: denies cough, dyspnea, DOE, pleurisy, hoarseness, laryngitis or wheezing.  Gastrointestinal: Denies dysphagia, heartburn, reflux, water brash, pain, cramps, nausea, vomiting, bloating, diarrhea, constipation, hematemesis, melena, hematochezia, jaundice or hemorrhoids Genitourinary: Denies dysuria, frequency, discharge, hematuria or flank pain. Has urgency, nocturia x 2-3 & occasional hesitancy. Musculoskeletal: Denies arthralgia, myalgia, stiffness, Jt. Swelling, pain, limp or strain/sprain. Denies Falls. Skin: Denies puritis, rash, hives, warts, acne, eczema or change in skin lesion Neuro: No weakness, tremor,  incoordination, spasms, paresthesia or pain Psychiatric: Denies confusion, memory loss or sensory loss. Denies Depression. Endocrine: Denies change in weight, skin, hair change, nocturia, and paresthesia, diabetic polys, visual blurring or hyper / hypo glycemic episodes.  Heme/Lymph: No excessive bleeding, bruising or enlarged lymph nodes.   Physical Exam  BP 138/76    Pulse (!) 59    Temp 97.7 F (36.5 C)    Resp (!) 0    Ht 5\' 10"  (1.778 m)    Wt 215 lb 9.6 oz (97.8 kg)    SpO2 98%    BMI 30.94 kg/m   General Appearance: Well nourished and well groomed and in no apparent distress.  Eyes: PERRLA, EOMs, conjunctiva no swelling or erythema, normal fundi and vessels. Sinuses: No frontal/maxillary tenderness ENT/Mouth: EACs patent / TMs  nl. Nares clear without erythema, swelling, mucoid exudates. Oral hygiene is good. No erythema, swelling, or exudate. Tongue normal, non-obstructing. Tonsils not swollen or erythematous. Hearing normal.  Neck: Supple, thyroid not palpable. No bruits, nodes or JVD. Respiratory: Respiratory effort normal.  BS equal and clear bilateral without rales, rhonci, wheezing or stridor. Cardio: Heart sounds are normal with regular rate and rhythm and no murmurs, rubs or gallops. Peripheral pulses are normal and equal bilaterally without edema. No aortic or femoral bruits. Chest: symmetric with normal excursions and percussion.  Abdomen: Soft, with Nl bowel sounds. Nontender, no guarding, rebound, hernias, masses, or organomegaly.  Lymphatics: Non tender without lymphadenopathy.  Musculoskeletal: Full ROM all peripheral extremities, joint stability, 5/5 strength, and normal gait. Skin: Warm and dry without rashes, lesions, cyanosis, clubbing or  ecchymosis.  Neuro: Cranial nerves intact, reflexes equal bilaterally. Normal muscle tone, no cerebellar symptoms. Sensation intact.  Pysch: Alert and oriented X 3 with normal affect, insight and judgment appropriate.    Assessment and Plan  1. Annual Preventative/Screening Exam    2. Essential hypertension  - EKG 12-Lead - [place], RETROPERITNL ABD,  LTD - Urinalysis, Routine w reflex microscopic - Microalbumin / creatinine urine ratio - CBC with Differential/Platelet - COMPLETE METABOLIC PANEL WITH GFR - Magnesium - TSH  3. Hyperlipidemia, mixed  - EKG 12-Lead - [place], RETROPERITNL ABD,  LTD - Lipid panel - TSH  4. Abnormal glucose  - EKG 12-Lead - [place], RETROPERITNL ABD,  LTD - Hemoglobin A1c - Insulin, random  5. Vitamin D deficiency  - VITAMIN D 25 Hydroxy   6. Stage 3b chronic kidney disease (HCC)  - COMPLETE METABOLIC PANEL WITH GFR  7. BPH with obstruction/lower urinary tract symptoms  - PSA  8. Prostate cancer screening  - PSA  9. Screening for ischemic heart disease  - EKG 12-Lead  10. Screening for AAA (aortic abdominal aneurysm)  - [place], RETROPERITNL ABD,  LTD  11. Medication management  - Urinalysis, Routine w reflex microscopic - Microalbumin / creatinine urine ratio - CBC with Differential/Platelet - COMPLETE METABOLIC PANEL WITH GFR - Magnesium - Lipid panel - TSH - Hemoglobin A1c - Insulin, random -  VITAMIN D 25 Hydroxy         Patient was counseled in prudent diet, weight control to achieve/maintain BMI less than 25, BP monitoring, regular exercise and medications as discussed.  Discussed med effects and SE's. Routine screening labs and tests as requested with regular follow-up as recommended. Over 40 minutes of exam, counseling, chart review and high complex critical decision making was performed   Kirtland Bouchard, MD

## 2021-02-10 ENCOUNTER — Encounter: Payer: Self-pay | Admitting: Internal Medicine

## 2021-02-10 ENCOUNTER — Other Ambulatory Visit: Payer: Self-pay

## 2021-02-10 ENCOUNTER — Ambulatory Visit (INDEPENDENT_AMBULATORY_CARE_PROVIDER_SITE_OTHER): Payer: Medicare Other | Admitting: Internal Medicine

## 2021-02-10 VITALS — BP 138/76 | HR 59 | Temp 97.7°F | Resp 0 | Ht 70.0 in | Wt 215.6 lb

## 2021-02-10 DIAGNOSIS — E559 Vitamin D deficiency, unspecified: Secondary | ICD-10-CM | POA: Diagnosis not present

## 2021-02-10 DIAGNOSIS — N401 Enlarged prostate with lower urinary tract symptoms: Secondary | ICD-10-CM

## 2021-02-10 DIAGNOSIS — N1832 Chronic kidney disease, stage 3b: Secondary | ICD-10-CM

## 2021-02-10 DIAGNOSIS — N138 Other obstructive and reflux uropathy: Secondary | ICD-10-CM

## 2021-02-10 DIAGNOSIS — I10 Essential (primary) hypertension: Secondary | ICD-10-CM

## 2021-02-10 DIAGNOSIS — R7309 Other abnormal glucose: Secondary | ICD-10-CM

## 2021-02-10 DIAGNOSIS — Z0001 Encounter for general adult medical examination with abnormal findings: Secondary | ICD-10-CM

## 2021-02-10 DIAGNOSIS — Z125 Encounter for screening for malignant neoplasm of prostate: Secondary | ICD-10-CM

## 2021-02-10 DIAGNOSIS — Z79899 Other long term (current) drug therapy: Secondary | ICD-10-CM

## 2021-02-10 DIAGNOSIS — Z0000 Encounter for general adult medical examination without abnormal findings: Secondary | ICD-10-CM | POA: Diagnosis not present

## 2021-02-10 DIAGNOSIS — Z136 Encounter for screening for cardiovascular disorders: Secondary | ICD-10-CM | POA: Diagnosis not present

## 2021-02-10 DIAGNOSIS — E782 Mixed hyperlipidemia: Secondary | ICD-10-CM

## 2021-02-10 NOTE — Progress Notes (Signed)
Aorta Scan <3 

## 2021-02-11 LAB — COMPLETE METABOLIC PANEL WITH GFR
AG Ratio: 1.8 (calc) (ref 1.0–2.5)
ALT: 18 U/L (ref 9–46)
AST: 17 U/L (ref 10–35)
Albumin: 4.1 g/dL (ref 3.6–5.1)
Alkaline phosphatase (APISO): 55 U/L (ref 35–144)
BUN/Creatinine Ratio: 14 (calc) (ref 6–22)
BUN: 23 mg/dL (ref 7–25)
CO2: 27 mmol/L (ref 20–32)
Calcium: 9.5 mg/dL (ref 8.6–10.3)
Chloride: 106 mmol/L (ref 98–110)
Creat: 1.62 mg/dL — ABNORMAL HIGH (ref 0.70–1.28)
Globulin: 2.3 g/dL (calc) (ref 1.9–3.7)
Glucose, Bld: 92 mg/dL (ref 65–99)
Potassium: 4.6 mmol/L (ref 3.5–5.3)
Sodium: 142 mmol/L (ref 135–146)
Total Bilirubin: 0.6 mg/dL (ref 0.2–1.2)
Total Protein: 6.4 g/dL (ref 6.1–8.1)
eGFR: 44 mL/min/{1.73_m2} — ABNORMAL LOW (ref >=60)

## 2021-02-11 LAB — URINALYSIS, ROUTINE W REFLEX MICROSCOPIC
Bacteria, UA: NONE SEEN /HPF
Bilirubin Urine: NEGATIVE
Glucose, UA: NEGATIVE
Hyaline Cast: NONE SEEN /LPF
Ketones, ur: NEGATIVE
Leukocytes,Ua: NEGATIVE
Nitrite: NEGATIVE
Protein, ur: NEGATIVE
RBC / HPF: NONE SEEN /HPF (ref 0–2)
Specific Gravity, Urine: 1.009 (ref 1.001–1.035)
Squamous Epithelial / HPF: NONE SEEN /HPF (ref <=5)
WBC, UA: NONE SEEN /HPF (ref 0–5)
pH: 5.5 (ref 5.0–8.0)

## 2021-02-11 LAB — MICROALBUMIN / CREATININE URINE RATIO
Creatinine, Urine: 77 mg/dL (ref 20–320)
Microalb Creat Ratio: 4 mcg/mg creat (ref <30)
Microalb, Ur: 0.3 mg/dL

## 2021-02-11 LAB — CBC WITH DIFFERENTIAL/PLATELET
Absolute Monocytes: 483 cells/uL (ref 200–950)
Basophils Absolute: 50 cells/uL (ref 0–200)
Basophils Relative: 0.7 %
Eosinophils Absolute: 241 cells/uL (ref 15–500)
Eosinophils Relative: 3.4 %
HCT: 47.9 % (ref 38.5–50.0)
Hemoglobin: 16 g/dL (ref 13.2–17.1)
Lymphs Abs: 1534 cells/uL (ref 850–3900)
MCH: 29.6 pg (ref 27.0–33.0)
MCHC: 33.4 g/dL (ref 32.0–36.0)
MCV: 88.5 fL (ref 80.0–100.0)
MPV: 9 fL (ref 7.5–12.5)
Monocytes Relative: 6.8 %
Neutro Abs: 4793 cells/uL (ref 1500–7800)
Neutrophils Relative %: 67.5 %
Platelets: 259 10*3/uL (ref 140–400)
RBC: 5.41 10*6/uL (ref 4.20–5.80)
RDW: 12.7 % (ref 11.0–15.0)
Total Lymphocyte: 21.6 %
WBC: 7.1 10*3/uL (ref 3.8–10.8)

## 2021-02-11 LAB — PSA: PSA: 3.02 ng/mL (ref <=4.00)

## 2021-02-11 LAB — MAGNESIUM: Magnesium: 2 mg/dL (ref 1.5–2.5)

## 2021-02-11 LAB — HEMOGLOBIN A1C
Hgb A1c MFr Bld: 5.5 % of total Hgb (ref <5.7)
Mean Plasma Glucose: 111 mg/dL
eAG (mmol/L): 6.2 mmol/L

## 2021-02-11 LAB — LIPID PANEL
Cholesterol: 126 mg/dL (ref <200)
HDL: 39 mg/dL — ABNORMAL LOW (ref >=40)
LDL Cholesterol (Calc): 65 mg/dL (calc)
Non-HDL Cholesterol (Calc): 87 mg/dL (calc) (ref <130)
Total CHOL/HDL Ratio: 3.2 (calc) (ref <5.0)
Triglycerides: 139 mg/dL (ref <150)

## 2021-02-11 LAB — MICROSCOPIC MESSAGE

## 2021-02-11 LAB — TSH: TSH: 1.86 mIU/L (ref 0.40–4.50)

## 2021-02-11 LAB — VITAMIN D 25 HYDROXY (VIT D DEFICIENCY, FRACTURES): Vit D, 25-Hydroxy: 82 ng/mL (ref 30–100)

## 2021-02-11 LAB — INSULIN, RANDOM: Insulin: 99.7 u[IU]/mL — ABNORMAL HIGH

## 2021-02-11 NOTE — Progress Notes (Signed)
=============================================================== °-   Test results slightly outside the reference range are not unusual. If there is anything important, I will review this with you,  otherwise it is considered normal test values.  If you have further questions,  please do not hesitate to contact me at the office or via My Chart.  =============================================================== ===============================================================  -  Total Chol =  126    &  LDL  Chol = 65      -   Both    Excellent   - Very low risk for Heart Attack  / Stroke ============================================================ ============================================================  -  PSA  - slightly lower than last year  &  in normal range  =============================================================== ===============================================================  -  A1c  - Normal  - No Diabetes   - Great   ! =============================================================== ===============================================================  -  Vitamin D = 82 - Excellent - Please keep dose same  =============================================================== ===============================================================  -  All Else - CBC - Kidneys - Electrolytes - Liver - Magnesium & Thyroid    - all  Normal / OK =============================================================== ===============================================================  - Keep up the [place] Work   !  =============================================================== ===============================================================

## 2021-04-21 DIAGNOSIS — L821 Other seborrheic keratosis: Secondary | ICD-10-CM | POA: Diagnosis not present

## 2021-04-21 DIAGNOSIS — D1801 Hemangioma of skin and subcutaneous tissue: Secondary | ICD-10-CM | POA: Diagnosis not present

## 2021-04-21 DIAGNOSIS — L570 Actinic keratosis: Secondary | ICD-10-CM | POA: Diagnosis not present

## 2021-05-04 ENCOUNTER — Ambulatory Visit: Payer: Medicare Other | Admitting: Adult Health

## 2021-05-12 ENCOUNTER — Other Ambulatory Visit: Payer: Self-pay | Admitting: Adult Health

## 2021-05-12 DIAGNOSIS — E782 Mixed hyperlipidemia: Secondary | ICD-10-CM

## 2021-05-12 DIAGNOSIS — I10 Essential (primary) hypertension: Secondary | ICD-10-CM

## 2021-05-21 NOTE — Progress Notes (Signed)
MEDICARE ANNUAL WELLNESS VISIT AND FOLLOW UP Assessment:    Annual Medicare Wellness Visit Due annually  Health maintenance reviewed  Essential hypertension Monitor blood pressure at home; call if consistently over 130/80 Continue DASH diet.   Reminder to go to the ER if any CP, SOB, nausea, dizziness, severe HA, changes vision/speech, left arm numbness and tingling and jaw pain.  Gastroesophageal reflux disease, esophagitis presence not specified Well managed on current medications, PPI PRN <2/week Discussed diet, avoiding triggers and other lifestyle changes  Vitamin D deficiency At goal at recent check; continue to recommend supplementation for goal of 70-100 Defer vitamin D level  Other abnormal glucose Recent A1Cs at goal Discussed diet/exercise, weight management  Defer A1C; check CMP  Obesity (BMI 30.0-34.9) Long discussion about weight loss, diet, and exercise Recommended diet heavy in fruits and veggies and low in animal meats, cheeses, and dairy products, appropriate calorie intake Patient will work on cutting sugar, watching portions Discussed appropriate weight for height and initial goal (<200 lb) Follow up at next visit  Medication management CBC, CMP/GFR, magnesium  Hyperlipidemia Continue medications: pravastatin 40 mg daily Continue low cholesterol diet and exercise.  Check lipid panel.   History of colonic polyps  UTD colonoscopy; high fiber diet encouraged  CKD (chronic kidney disease) stage 3b, GFR 30-44 ml/min (HCC) Increase fluids, avoid NSAIDS, monitor sugars, will monitor Nephrology following - CMP/GFR  Erectile dysfunction No longer needing med at this time. Sildenafil worked well if needing to restart.   Laceration/need for tetanus booster - Tdap administered without complication.   Orders Placed This Encounter  Procedures   CBC with Differential/Platelet   COMPLETE METABOLIC PANEL WITH GFR   Magnesium   Lipid panel   TSH      Over 30 minutes of exam, counseling, chart review, and critical decision making was performed  Future Appointments  Date Time Provider [place]  08/30/2021 10:30 AM [name], MD [facility] None  02/16/2022 11:00 AM [name], MD [facility] None     Plan:   During the course of the visit the patient was educated and counseled about appropriate screening and preventive services including:   Pneumococcal vaccine  Influenza vaccine Prevnar 13 Td vaccine Screening electrocardiogram Colorectal cancer screening Diabetes screening Glaucoma screening Nutrition counseling    Subjective:  Jeffrey Carey is a 76 y.o. male who presents for Medicare Annual Wellness Visit and 3 month follow up for HTN, hyperlipidemia, glucose management, and vitamin D Def.   His wife was diagnosed with breast cancer in 2020, mets to liver and passed early 2023 after brief hospice. Daughter is close by and checks on him, visiting with high school friends, going out to ball games.   he has a diagnosis of GERD which is currently managed by lifestyle, hasn't needed omeprazole (OTC) in over 6 months.   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 Has been doing more takeout.  Wt Readings from Last 3 Encounters:  05/25/21 215 lb 3.2 oz (97.6 kg)  02/10/21 215 lb 9.6 oz (97.8 kg)  11/17/20 218 lb (98.9 kg)   His blood pressure has been controlled at home (130s/low 80s), today their BP is BP: 124/72 He does workout. He denies chest pain, shortness of breath, dizziness.   He is on cholesterol medication (pravastatin 40 mg daily) and denies  myalgias. His cholesterol is at goal. The cholesterol last visit was:   Lab Results  Component Value Date   CHOL 126 02/10/2021   HDL 39 (L) 02/10/2021   LDLCALC 65 02/10/2021   TRIG 139 02/10/2021   CHOLHDL  3.2 02/10/2021   He has been working on diet and exercise for history of prediabetes (last elevation 5.7% in 2017), and denies increased appetite, nausea, paresthesia of the feet, polydipsia, polyuria, visual disturbances, vomiting and weight loss. Last A1C in the office was:  Lab Results  Component Value Date   HGBA1C 5.5 02/10/2021   He has CKD IIIb without albuminuria monitored at nephrology, Dr. Joylene Grapes following annually, we monitor in between, getting 3-4+ bottles of water daily:  Lab Results  Component Value Date   EGFR 44 (L) 02/10/2021   EGFR 45 (L) 11/17/2020   EGFR 40 (L) 11/04/2020    Patient is on Vitamin D supplement.   Lab Results  Component Value Date   VD25OH 82 02/10/2021      Medication Review:   Current Outpatient Medications (Cardiovascular):    atenolol (TENORMIN) 100 MG tablet, TAKE 1 TABLET BY MOUTH EVERY DAY FOR BLOOD PRESSURE   losartan (COZAAR) 25 MG tablet, TAKE 1 TABLET (25 MG TOTAL) BY MOUTH DAILY. FOR BLOOD PRESSURE AND KIDNEY PROTECTION.   pravastatin (PRAVACHOL) 40 MG tablet, TAKE 1 TABLET BY MOUTH EVERY DAY AT BEDTIME FOR CHOLESTEROL   Current Outpatient Medications (Analgesics):    aspirin 81 MG tablet, Take 81 mg by mouth at bedtime.    Current Outpatient Medications (Other):    CINNAMON PO, Take 1,000 mg by mouth daily.   Flaxseed, Linseed, (FLAXSEED OIL PO), Take 1 capsule by mouth daily.   gabapentin (NEURONTIN) 300 MG capsule, Take 1 to 3 capsules at night for Pain   Magnesium 250 MG TABS, Take by mouth.   Omega-3 Fatty Acids (FISH OIL PO), Take 1,200 mg by mouth at bedtime.    VITAMIN D PO, Take 5,000 Units by mouth daily.  Allergies: Allergies  Allergen Reactions   Ceftin [Cefuroxime Axetil] Hives   Peanut-Containing Drug Products Other (See Comments)    Break out on nose     Current Problems (verified) has History of colonic polyps; Essential hypertension; Hyperlipidemia, mixed; Abnormal glucose; Vitamin D deficiency;  Gastroesophageal reflux disease; Obesity (BMI 30.0-34.9); CKD (chronic kidney disease) stage 3, GFR 30-59 ml/min ([place]); History of prediabetes; and ED (erectile dysfunction) on their problem list.  Screening Tests Immunization History  Administered Date(s) Administered   Influenza Whole 10/10/2012   Influenza, High Dose Seasonal PF 11/07/2016, 10/26/2017, 10/09/2018, 10/16/2019, 10/08/2020   Influenza-Unspecified 11/04/2014   PFIZER Comirnaty(Gray Top)Covid-19 Tri-Sucrose Vaccine 07/16/2020   PFIZER(Purple Top)SARS-COV-2 Vaccination 02/14/2019, 03/11/2019, 11/16/2019   Pneumococcal Conjugate-13 02/05/2014   Pneumococcal Polysaccharide-23 07/10/2012   Td 03/07/2006   Health Maintenance  Topic Date Due   Zoster Vaccines- Shingrix (1 of 2) Never done   TETANUS/TDAP  06/14/2016   COVID-19 Vaccine (5 - Booster for Pfizer series) 06/10/2021 (Originally 09/10/2020)   INFLUENZA VACCINE  08/03/2021   COLONOSCOPY (Pts 45-39yr Insurance coverage will need to be confirmed)  06/28/2022   Pneumonia Vaccine 76 Years old  Completed   Hepatitis C Screening  Completed   HPV VACCINES  Aged Out   Last colonoscopy: 06/28/2019 - 3 year recall, Dr. DLoletha Carrow TD : 2008- high risk due to activities, recent lacerations, will boost Shingles/Zostavax: check at pharmacy   Names of Other Physician/Practitioners you currently use: 1. [facility] here for primary care 2. Dr. Katy Fitch, eye doctor, last visit 10/2020 3. Dr. Rollene Fare, dentist, last visit 03/2021   Patient Care Team: [name], MD as PCP - General (Internal Medicine) Kathie Rhodes, MD (Inactive) as Consulting Physician (Urology) Byng (Optometry) Joylene Grapes Shaune Pollack, MD as Consulting Physician (Nephrology) Jamesetta Orleans, DDS (Dentistry)  Surgical: He  has a past surgical history that includes Laparoscopic cholecystectomy (10/2010); Hammer toe surgery (2012); Anterior cervical decomp/discectomy fusion  (N/A, 04/30/2014); and Colonoscopy. Family His family history includes Alzheimer's disease in his father; Colon cancer (age of onset: 8) in his father and mother; Diabetes in his father; Ovarian cancer in his mother; Prostate cancer in his father. Social history  He reports that he has never smoked. He has never used smokeless tobacco. He reports that he does not drink alcohol and does not use drugs.  MEDICARE WELLNESS OBJECTIVES: Physical activity: Current Exercise Habits: Home exercise routine, Type of exercise: walking, Time (Minutes): 30, Frequency (Times/Week): 3, Weekly Exercise (Minutes/Week): 90, Intensity: Mild, Exercise limited by: None identified Cardiac risk factors: Cardiac Risk Factors include: advanced age (>54mn, >>71women);dyslipidemia;hypertension;male gender;obesity (BMI >30kg/m2) Depression/mood screen:      05/25/2021   12:29 PM  Depression screen PHQ 2/9  Decreased Interest 0  Down, Depressed, Hopeless 1  PHQ - 2 Score 1    ADLs:     05/25/2021   12:27 PM 02/09/2021   11:15 PM  In your present state of health, do you have any difficulty performing the following activities:  Hearing? 0 0  Vision? 0 0  Difficulty concentrating or making decisions? 0 0  Walking or climbing stairs? 0 0  Dressing or bathing? 0 0  Doing errands, shopping? 0 0     Cognitive Testing  Alert? Yes  Normal Appearance?Yes  Oriented to person? Yes  Place? Yes   Time? Yes  Recall of three objects?  Yes  Can perform simple calculations? Yes  Displays appropriate judgment?Yes  Can read the correct time from a watch face?Yes  EOL planning: Does Patient Have a Medical Advance Directive?: Yes Type of Advance Directive: Healthcare Power of Attorney, Living will Does patient want to make changes to medical advance directive?: No - Patient declined Copy of [place]?: Yes - validated most recent copy scanned in chart (See row information)   Objective:   Today's  Vitals   05/25/21 1138  BP: 124/72  Pulse: (!) 56  Temp: 97.7 F (36.5 C)  SpO2: 98%  Weight: 215 lb 3.2 oz (97.6 kg)    Body mass index is 30.88 kg/m.  General appearance: alert, no distress, WD/WN, male HEENT: normocephalic, sclerae anicteric, TMs pearly, nares patent, no discharge or erythema, pharynx normal Oral cavity: MMM, no lesions Neck: supple, no lymphadenopathy, no thyromegaly, no masses Heart: RRR, normal S1, S2, no murmurs Lungs: CTA bilaterally, no wheezes, rhonchi, or rales Abdomen: +bs, soft, non tender, non distended, no masses, no hepatomegaly, no splenomegaly Musculoskeletal: nontender, no swelling, no obvious deformity.  Extremities: no edema, no cyanosis, no clubbing Pulses: 2+ symmetric, upper and lower extremities, normal cap refill Neurological: alert, oriented x 3, CN2-12 intact, strength normal upper extremities and lower extremities, sensation normal throughout, DTRs 2+ throughout, no cerebellar signs, gait normal Psychiatric: normal affect, behavior normal, pleasant   Medicare Attestation I have personally reviewed: The patient's medical and social history Their use of alcohol, tobacco or illicit drugs Their current medications and supplements  The patient's functional ability including ADLs,fall risks, home safety risks, cognitive, and hearing and visual impairment Diet and physical activities Evidence for depression or mood disorders  The patient's weight, height, BMI, and visual acuity have been recorded in the chart.  I have made referrals, counseling, and provided education to the patient based on review of the above and I have provided the patient with a written personalized care plan for preventive services.     Izora Ribas, NP   05/25/2021

## 2021-05-25 ENCOUNTER — Ambulatory Visit (INDEPENDENT_AMBULATORY_CARE_PROVIDER_SITE_OTHER): Payer: Medicare Other | Admitting: Adult Health

## 2021-05-25 ENCOUNTER — Encounter: Payer: Self-pay | Admitting: Adult Health

## 2021-05-25 VITALS — BP 124/72 | HR 56 | Temp 97.7°F | Wt 215.2 lb

## 2021-05-25 DIAGNOSIS — E559 Vitamin D deficiency, unspecified: Secondary | ICD-10-CM

## 2021-05-25 DIAGNOSIS — N1832 Chronic kidney disease, stage 3b: Secondary | ICD-10-CM

## 2021-05-25 DIAGNOSIS — Z0000 Encounter for general adult medical examination without abnormal findings: Secondary | ICD-10-CM

## 2021-05-25 DIAGNOSIS — Z86010 Personal history of colonic polyps: Secondary | ICD-10-CM

## 2021-05-25 DIAGNOSIS — S61411A Laceration without foreign body of right hand, initial encounter: Secondary | ICD-10-CM

## 2021-05-25 DIAGNOSIS — Z0001 Encounter for general adult medical examination with abnormal findings: Secondary | ICD-10-CM

## 2021-05-25 DIAGNOSIS — N529 Male erectile dysfunction, unspecified: Secondary | ICD-10-CM

## 2021-05-25 DIAGNOSIS — K219 Gastro-esophageal reflux disease without esophagitis: Secondary | ICD-10-CM

## 2021-05-25 DIAGNOSIS — Z87898 Personal history of other specified conditions: Secondary | ICD-10-CM

## 2021-05-25 DIAGNOSIS — I10 Essential (primary) hypertension: Secondary | ICD-10-CM | POA: Diagnosis not present

## 2021-05-25 DIAGNOSIS — Z23 Encounter for immunization: Secondary | ICD-10-CM | POA: Diagnosis not present

## 2021-05-25 DIAGNOSIS — E782 Mixed hyperlipidemia: Secondary | ICD-10-CM

## 2021-05-25 DIAGNOSIS — R6889 Other general symptoms and signs: Secondary | ICD-10-CM

## 2021-05-25 DIAGNOSIS — R7309 Other abnormal glucose: Secondary | ICD-10-CM

## 2021-05-25 DIAGNOSIS — E669 Obesity, unspecified: Secondary | ICD-10-CM

## 2021-05-25 NOTE — Addendum Note (Signed)
Addended by: Chancy Hurter on: 05/25/2021 03:09 PM   Modules accepted: Orders

## 2021-05-26 LAB — LIPID PANEL
Cholesterol: 130 mg/dL (ref <200)
HDL: 37 mg/dL — ABNORMAL LOW (ref >=40)
LDL Cholesterol (Calc): 66 mg/dL (calc)
Non-HDL Cholesterol (Calc): 93 mg/dL (calc) (ref <130)
Total CHOL/HDL Ratio: 3.5 (calc) (ref <5.0)
Triglycerides: 198 mg/dL — ABNORMAL HIGH (ref <150)

## 2021-05-26 LAB — CBC WITH DIFFERENTIAL/PLATELET
Absolute Monocytes: 611 cells/uL (ref 200–950)
Basophils Absolute: 60 cells/uL (ref 0–200)
Basophils Relative: 0.7 %
Eosinophils Absolute: 241 cells/uL (ref 15–500)
Eosinophils Relative: 2.8 %
HCT: 50.3 % — ABNORMAL HIGH (ref 38.5–50.0)
Hemoglobin: 17.3 g/dL — ABNORMAL HIGH (ref 13.2–17.1)
Lymphs Abs: 1582 cells/uL (ref 850–3900)
MCH: 30.4 pg (ref 27.0–33.0)
MCHC: 34.4 g/dL (ref 32.0–36.0)
MCV: 88.4 fL (ref 80.0–100.0)
MPV: 9.3 fL (ref 7.5–12.5)
Monocytes Relative: 7.1 %
Neutro Abs: 6106 cells/uL (ref 1500–7800)
Neutrophils Relative %: 71 %
Platelets: 286 10*3/uL (ref 140–400)
RBC: 5.69 10*6/uL (ref 4.20–5.80)
RDW: 13.1 % (ref 11.0–15.0)
Total Lymphocyte: 18.4 %
WBC: 8.6 10*3/uL (ref 3.8–10.8)

## 2021-05-26 LAB — COMPLETE METABOLIC PANEL WITH GFR
AG Ratio: 1.6 (calc) (ref 1.0–2.5)
ALT: 19 U/L (ref 9–46)
AST: 20 U/L (ref 10–35)
Albumin: 4.5 g/dL (ref 3.6–5.1)
Alkaline phosphatase (APISO): 58 U/L (ref 35–144)
BUN/Creatinine Ratio: 13 (calc) (ref 6–22)
BUN: 20 mg/dL (ref 7–25)
CO2: 26 mmol/L (ref 20–32)
Calcium: 9.9 mg/dL (ref 8.6–10.3)
Chloride: 105 mmol/L (ref 98–110)
Creat: 1.6 mg/dL — ABNORMAL HIGH (ref 0.70–1.28)
Globulin: 2.9 g/dL (calc) (ref 1.9–3.7)
Glucose, Bld: 80 mg/dL (ref 65–99)
Potassium: 4.8 mmol/L (ref 3.5–5.3)
Sodium: 139 mmol/L (ref 135–146)
Total Bilirubin: 0.5 mg/dL (ref 0.2–1.2)
Total Protein: 7.4 g/dL (ref 6.1–8.1)
eGFR: 45 mL/min/{1.73_m2} — ABNORMAL LOW (ref >=60)

## 2021-05-26 LAB — TSH: TSH: 1.92 mIU/L (ref 0.40–4.50)

## 2021-05-26 LAB — MAGNESIUM: Magnesium: 2.3 mg/dL (ref 1.5–2.5)

## 2021-08-29 NOTE — Patient Instructions (Signed)

## 2021-08-29 NOTE — Progress Notes (Unsigned)
Future Appointments  Date Time Provider Department  08/30/2021 10:30 AM [name], MD [facility]  02/16/2022 11:00 AM [name], MD [facility]      History of Present Illness:       This very nice 76 y.o. MWM presents for 6  month follow up with HTN, HLD, Pre-Diabetes and Vitamin D Deficiency.        Patient is treated for HTN since 2006  & BP has been controlled at home. Today's BP is  high normal at 140/90 .  Patient has CKD3b (GFR 45). Patient has had no complaints of any cardiac type chest pain, palpitations, dyspnea / orthopnea / PND, dizziness, claudication, or dependent edema.       Hyperlipidemia is controlled with diet & Pravastatin . Patient denies myalgias or other med SE's. Last Lipids were at goal except elevated Trig's :  Lab Results  Component Value Date   CHOL 130 05/25/2021   HDL 37 (L) 05/25/2021   LDLCALC 66 05/25/2021   TRIG 198 (H) 05/25/2021   CHOLHDL 3.5 05/25/2021     Also, the patient has history of PreDiabetes (A1c 5.7% /2011) and has had no symptoms of reactive hypoglycemia, diabetic polys, paresthesias or visual blurring.  Last A1c was normal & at goal :  Lab Results  Component Value Date   HGBA1C 5.5 02/10/2021                                                      Further, the patient also has history of Vitamin D Deficiency and supplements vitamin D without any suspected side-effects. Last vitamin D was at goal :  Lab Results  Component Value Date   VD25OH 82 02/10/2021       Current Outpatient Medications:     aspirin 81 MG tablet, Take at bedtime   atenolol (TENORMIN) 100 MG tablet, TAKE 1 TABLET EVERY DAY    CINNAMON 1,000 mg , Take  daily   FLAXSEED OIL , Take 1 capsule \ daily   losartan 25 MG tablet, TAKE 1 TABLET  DAILY   Magnesium 250 MG TABS, Take daily   Omega-3 FISH OIL 1,200 mg , Take at bedtime   pravastatin 40 MG tablet, TAKE 1 TABLET EVERY DAY    VITAMIN D  5,000 Units, Take  daily    Allergies   Allergen Reactions   Ceftin [Cefuroxime Axetil] Hives   Peanut-Containing Drug Products Other (See Comments)    Break out on nose     PMHx:   Past Medical History:  Diagnosis Date   Chronic kidney disease    Dyslipidemia    ED (erectile dysfunction)    GERD (gastroesophageal reflux disease)    History of kidney stones    Hyperlipidemia    Hypertension    Radiculopathy 04/30/2014   Vitamin D deficiency      Immunization History  Administered Date(s) Administered   Influenza Whole 10/10/2012   Influenza, High Dose  10/09/2018, 10/16/2019   Influenza 11/04/2014   PFIZER Comirnaty Covid-19 Vacc 07/16/2020   PFIZER SARS-COV-2 Vacc 02/14/2019, 03/11/2019, 11/16/2019   Pneumococcal - 13 02/05/2014   Pneumococcal - 23 07/10/2012   Td 03/07/2006     Past Surgical History:  Procedure Laterality Date   ANT CERVICAL DECOMP/DISCECTOMY FUSION N/A 04/30/2014   Procedure:  ANT CERVICAL DECOMPRESSION/DISCECTOMY FUSION 1 LEVEL;  Surgeon: Phylliss Bob, MD;  Location: [facility];  Service: Orthopedics;  Laterality: N/A;  Anterior cervical decompression fusion, cervical 7-thoracic 1 with instrumentation and allograft   COLONOSCOPY     HAMMER TOE SURGERY  2012   left foot, 3rd toe   LAPAROSCOPIC CHOLECYSTECTOMY  10/2010    FHx:    Reviewed / unchanged  SHx:    Reviewed / unchanged   Systems Review:  Constitutional: Denies fever, chills, wt changes, headaches, insomnia, fatigue, night sweats, change in appetite. Eyes: Denies redness, blurred vision, diplopia, discharge, itchy, watery eyes.  ENT: Denies discharge, congestion, post nasal drip, epistaxis, sore throat, earache, hearing loss, dental pain, tinnitus, vertigo, sinus pain, snoring.  CV: Denies chest pain, palpitations, irregular heartbeat, syncope, dyspnea, diaphoresis, orthopnea, PND, claudication or edema. Respiratory: denies cough, dyspnea, DOE, pleurisy, hoarseness, laryngitis, wheezing.  Gastrointestinal: Denies dysphagia,  odynophagia, heartburn, reflux, water brash, abdominal pain or cramps, nausea, vomiting, bloating, diarrhea, constipation, hematemesis, melena, hematochezia  or hemorrhoids. Genitourinary: Denies dysuria, frequency, urgency, nocturia, hesitancy, discharge, hematuria or flank pain. Musculoskeletal: Denies arthralgias, myalgias, stiffness, jt. swelling, pain, limping or strain/sprain.  Skin: Denies pruritus, rash, hives, warts, acne, eczema or change in skin lesion(s). Neuro: No weakness, tremor, incoordination, spasms, paresthesia or pain. Psychiatric: Denies confusion, memory loss or sensory loss. Endo: Denies change in weight, skin or hair change.  Heme/Lymph: No excessive bleeding, bruising or enlarged lymph nodes.  Physical Exam  BP (!) 140/90   Pulse 60   Temp 97.9 F (36.6 C)   Resp 16   Ht '5\' 10"'$  (1.778 m)   Wt 201 lb 3.2 oz (91.3 kg)   SpO2 97%   BMI 28.87 kg/m   Appears  well nourished, well groomed  and in no distress.  Eyes: PERRLA, EOMs, conjunctiva no swelling or erythema. Sinuses: No frontal/maxillary tenderness ENT/Mouth: EAC's clear, TM's nl w/o erythema, bulging. Nares clear w/o erythema, swelling, exudates. Oropharynx clear without erythema or exudates. Oral hygiene is good. Tongue normal, non obstructing. Hearing intact.  Neck: Supple. Thyroid not palpable. Car 2+/2+ without bruits, nodes or JVD. Chest: Respirations nl with BS clear & equal w/o rales, rhonchi, wheezing or stridor.  Cor: Heart sounds normal w/ regular rate and rhythm without sig. murmurs, gallops, clicks or rubs. Peripheral pulses normal and equal  without edema.  Abdomen: Soft & bowel sounds normal. Non-tender w/o guarding, rebound, hernias, masses or organomegaly.  Lymphatics: Unremarkable.  Musculoskeletal: Full ROM all peripheral extremities, joint stability, 5/5 strength and normal gait.  Skin: Warm, dry without exposed rashes, lesions or ecchymosis apparent.  Neuro: Cranial nerves intact,  reflexes equal bilaterally. Sensory-motor testing grossly intact. Tendon reflexes grossly intact.  Pysch: Alert & oriented x 3.  Insight and judgement nl & appropriate. No ideations.  Assessment and Plan:  1. Essential hypertension  - CBC with Differential/Platelet - COMPLETE METABOLIC PANEL WITH GFR - Magnesium - TSH  2. Hyperlipidemia, mixed  - Lipid panel - TSH  3. Abnormal glucose  - Hemoglobin A1c  4. Vitamin D deficiency  - VITAMIN D 25 Hydroxy   5. Stage 3b chronic kidney disease (HCC)  - COMPLETE METABOLIC PANEL WITH GFR  6. Medication management - CBC with Differential/Platelet - COMPLETE METABOLIC PANEL WITH GFR - Magnesium - Lipid panel - TSH - Hemoglobin A1c - VITAMIN D 25 Hydroxy          Discussed  regular exercise, BP monitoring, weight control to achieve/maintain BMI less than 25 and  discussed med and SE's. Recommended labs to assess and monitor clinical status with further disposition pending results of labs.  I discussed the assessment and treatment plan with the patient. The patient was provided an opportunity to ask questions and all were answered. The patient agreed with the plan and demonstrated an understanding of the instructions.  I provided over 30 minutes of exam, counseling, chart review and  complex critical decision making.        The patient was advised to call back or seek an in-person evaluation if the symptoms worsen or if the condition fails to improve as anticipated.   Kirtland Bouchard, MD

## 2021-08-30 ENCOUNTER — Ambulatory Visit (INDEPENDENT_AMBULATORY_CARE_PROVIDER_SITE_OTHER): Payer: Medicare Other | Admitting: Internal Medicine

## 2021-08-30 ENCOUNTER — Encounter: Payer: Self-pay | Admitting: Internal Medicine

## 2021-08-30 VITALS — BP 140/90 | HR 60 | Temp 97.9°F | Resp 16 | Ht 70.0 in | Wt 201.2 lb

## 2021-08-30 DIAGNOSIS — E782 Mixed hyperlipidemia: Secondary | ICD-10-CM | POA: Diagnosis not present

## 2021-08-30 DIAGNOSIS — Z79899 Other long term (current) drug therapy: Secondary | ICD-10-CM | POA: Diagnosis not present

## 2021-08-30 DIAGNOSIS — E559 Vitamin D deficiency, unspecified: Secondary | ICD-10-CM | POA: Diagnosis not present

## 2021-08-30 DIAGNOSIS — R7309 Other abnormal glucose: Secondary | ICD-10-CM | POA: Diagnosis not present

## 2021-08-30 DIAGNOSIS — I10 Essential (primary) hypertension: Secondary | ICD-10-CM | POA: Diagnosis not present

## 2021-08-30 DIAGNOSIS — N1832 Chronic kidney disease, stage 3b: Secondary | ICD-10-CM | POA: Diagnosis not present

## 2021-08-31 LAB — CBC WITH DIFFERENTIAL/PLATELET
Absolute Monocytes: 501 cells/uL (ref 200–950)
Basophils Absolute: 59 cells/uL (ref 0–200)
Basophils Relative: 0.9 %
Eosinophils Absolute: 202 cells/uL (ref 15–500)
Eosinophils Relative: 3.1 %
HCT: 50 % (ref 38.5–50.0)
Hemoglobin: 17 g/dL (ref 13.2–17.1)
Lymphs Abs: 1684 cells/uL (ref 850–3900)
MCH: 30.3 pg (ref 27.0–33.0)
MCHC: 34 g/dL (ref 32.0–36.0)
MCV: 89.1 fL (ref 80.0–100.0)
MPV: 8.9 fL (ref 7.5–12.5)
Monocytes Relative: 7.7 %
Neutro Abs: 4056 cells/uL (ref 1500–7800)
Neutrophils Relative %: 62.4 %
Platelets: 261 10*3/uL (ref 140–400)
RBC: 5.61 10*6/uL (ref 4.20–5.80)
RDW: 13 % (ref 11.0–15.0)
Total Lymphocyte: 25.9 %
WBC: 6.5 10*3/uL (ref 3.8–10.8)

## 2021-08-31 LAB — COMPLETE METABOLIC PANEL WITH GFR
AG Ratio: 1.6 (calc) (ref 1.0–2.5)
ALT: 21 U/L (ref 9–46)
AST: 20 U/L (ref 10–35)
Albumin: 4.3 g/dL (ref 3.6–5.1)
Alkaline phosphatase (APISO): 54 U/L (ref 35–144)
BUN/Creatinine Ratio: 14 (calc) (ref 6–22)
BUN: 26 mg/dL — ABNORMAL HIGH (ref 7–25)
CO2: 26 mmol/L (ref 20–32)
Calcium: 9.8 mg/dL (ref 8.6–10.3)
Chloride: 102 mmol/L (ref 98–110)
Creat: 1.86 mg/dL — ABNORMAL HIGH (ref 0.70–1.28)
Globulin: 2.7 g/dL (calc) (ref 1.9–3.7)
Glucose, Bld: 88 mg/dL (ref 65–99)
Potassium: 5.3 mmol/L (ref 3.5–5.3)
Sodium: 137 mmol/L (ref 135–146)
Total Bilirubin: 0.6 mg/dL (ref 0.2–1.2)
Total Protein: 7 g/dL (ref 6.1–8.1)
eGFR: 37 mL/min/{1.73_m2} — ABNORMAL LOW (ref >=60)

## 2021-08-31 LAB — VITAMIN D 25 HYDROXY (VIT D DEFICIENCY, FRACTURES): Vit D, 25-Hydroxy: 94 ng/mL (ref 30–100)

## 2021-08-31 LAB — LIPID PANEL
Cholesterol: 124 mg/dL (ref <200)
HDL: 34 mg/dL — ABNORMAL LOW (ref >=40)
LDL Cholesterol (Calc): 68 mg/dL (calc)
Non-HDL Cholesterol (Calc): 90 mg/dL (calc) (ref <130)
Total CHOL/HDL Ratio: 3.6 (calc) (ref <5.0)
Triglycerides: 132 mg/dL (ref <150)

## 2021-08-31 LAB — TSH: TSH: 1.93 mIU/L (ref 0.40–4.50)

## 2021-08-31 LAB — HEMOGLOBIN A1C
Hgb A1c MFr Bld: 5.4 % of total Hgb (ref <5.7)
Mean Plasma Glucose: 108 mg/dL
eAG (mmol/L): 6 mmol/L

## 2021-08-31 LAB — MAGNESIUM: Magnesium: 2.3 mg/dL (ref 1.5–2.5)

## 2021-09-13 ENCOUNTER — Encounter: Payer: Self-pay | Admitting: Internal Medicine

## 2021-09-29 ENCOUNTER — Other Ambulatory Visit: Payer: Self-pay | Admitting: Nurse Practitioner

## 2021-10-04 DIAGNOSIS — N1832 Chronic kidney disease, stage 3b: Secondary | ICD-10-CM | POA: Diagnosis not present

## 2021-10-11 DIAGNOSIS — E875 Hyperkalemia: Secondary | ICD-10-CM | POA: Diagnosis not present

## 2021-10-11 DIAGNOSIS — N200 Calculus of kidney: Secondary | ICD-10-CM | POA: Diagnosis not present

## 2021-10-11 DIAGNOSIS — I129 Hypertensive chronic kidney disease with stage 1 through stage 4 chronic kidney disease, or unspecified chronic kidney disease: Secondary | ICD-10-CM | POA: Diagnosis not present

## 2021-10-11 DIAGNOSIS — N1832 Chronic kidney disease, stage 3b: Secondary | ICD-10-CM | POA: Diagnosis not present

## 2021-10-19 ENCOUNTER — Ambulatory Visit (INDEPENDENT_AMBULATORY_CARE_PROVIDER_SITE_OTHER): Payer: Medicare Other

## 2021-10-19 VITALS — Temp 98.0°F

## 2021-10-19 DIAGNOSIS — Z23 Encounter for immunization: Secondary | ICD-10-CM

## 2021-10-27 DIAGNOSIS — I129 Hypertensive chronic kidney disease with stage 1 through stage 4 chronic kidney disease, or unspecified chronic kidney disease: Secondary | ICD-10-CM | POA: Diagnosis not present

## 2021-10-27 DIAGNOSIS — N1832 Chronic kidney disease, stage 3b: Secondary | ICD-10-CM | POA: Diagnosis not present

## 2021-10-28 ENCOUNTER — Encounter: Payer: Self-pay | Admitting: Internal Medicine

## 2021-11-30 ENCOUNTER — Ambulatory Visit: Payer: Medicare Other | Admitting: Nurse Practitioner

## 2021-12-09 ENCOUNTER — Ambulatory Visit (INDEPENDENT_AMBULATORY_CARE_PROVIDER_SITE_OTHER): Payer: Medicare Other | Admitting: Nurse Practitioner

## 2021-12-09 ENCOUNTER — Encounter: Payer: Self-pay | Admitting: Nurse Practitioner

## 2021-12-09 VITALS — BP 160/92 | HR 54 | Temp 97.3°F | Ht 70.0 in | Wt 219.0 lb

## 2021-12-09 DIAGNOSIS — Z634 Disappearance and death of family member: Secondary | ICD-10-CM

## 2021-12-09 DIAGNOSIS — R7309 Other abnormal glucose: Secondary | ICD-10-CM | POA: Diagnosis not present

## 2021-12-09 DIAGNOSIS — K219 Gastro-esophageal reflux disease without esophagitis: Secondary | ICD-10-CM

## 2021-12-09 DIAGNOSIS — N1832 Chronic kidney disease, stage 3b: Secondary | ICD-10-CM | POA: Diagnosis not present

## 2021-12-09 DIAGNOSIS — I10 Essential (primary) hypertension: Secondary | ICD-10-CM | POA: Diagnosis not present

## 2021-12-09 DIAGNOSIS — Z79899 Other long term (current) drug therapy: Secondary | ICD-10-CM

## 2021-12-09 DIAGNOSIS — E669 Obesity, unspecified: Secondary | ICD-10-CM

## 2021-12-09 DIAGNOSIS — E782 Mixed hyperlipidemia: Secondary | ICD-10-CM | POA: Diagnosis not present

## 2021-12-09 NOTE — Progress Notes (Signed)
FOLLOW UP Assessment:    Essential hypertension Elevated in clinic.  Continue to follow with Nephrology as directed. Discussed DASH (Dietary Approaches to Stop Hypertension) DASH diet is lower in sodium than a typical American diet. Cut back on foods that are high in saturated fat, cholesterol, and trans fats. Eat more whole-grain foods, fish, poultry, and nuts Remain active and exercise as tolerated daily.  Monitor BP at home-Call if greater than 130/80 consistently.  Check CMP/CBC Report to ER for any increase in stroke like symptoms, including HA, N/V, paralysis, difficulty speaking, trouble walking, confusion, vision changes, CP, heart palpitations, SOB, diaphoresis.    Gastroesophageal reflux disease, esophagitis presence not specified Continue medication PRN. No suspected reflux complications (Barret/stricture). Lifestyle modification:  wt loss, avoid meals 2-3h before bedtime. Consider eliminating food triggers:  chocolate, caffeine, EtOH, acid/spicy food.  Other abnormal glucose Education: Reviewed 'ABCs' of diabetes management  Discussed goals to be met and/or maintained include A1C (<7) Blood pressure (<130/80) Cholesterol (LDL <70) Continue Eye Exam yearly  Continue Dental Exam Q6 mo Discussed dietary recommendations Discussed Physical Activity recommendations Check A1C   Obesity (BMI 30.0-34.9) Discussed appropriate BMI Diet modification. Physical activity. Encouraged/praised to build confidence.   Medication management All medications discussed and reviewed in full. All questions and concerns regarding medications addressed.     Hyperlipidemia Discussed lifestyle modifications. Recommended diet heavy in fruits and veggies, omega 3's. Decrease consumption of animal meats, cheeses, and dairy products. Remain active and exercise as tolerated. Continue to monitor. Check lipids/TSH   CKD (chronic kidney disease) stage 3b, GFR 30-44 ml/min  (HCC) Discussed how what you eat and drink can aide in kidney protection. Stay well hydrated. Avoid high salt foods. Avoid NSAIDS. Keep BP and BG well controlled.   Take medications as prescribed. Remain active and exercise as tolerated daily. Maintain weight.  Continue to monitor. Check CMP/GFR/Microablumin   Death of Wife  Good support system with family and friends Discussed coping techniques Declines further medication intervention at this time Contact office if s/s become unmanageable.  Orders Placed This Encounter  Procedures   CBC with Differential/Platelet   COMPLETE METABOLIC PANEL WITH GFR     Notify office for further evaluation and treatment, questions or concerns if any reported s/s fail to improve.   The patient was advised to call back or seek an in-person evaluation if any symptoms worsen or if the condition fails to improve as anticipated.   Further disposition pending results of labs. Discussed med's effects and SE's.    I discussed the assessment and treatment plan with the patient. The patient was provided an opportunity to ask questions and all were answered. The patient agreed with the plan and demonstrated an understanding of the instructions.  Discussed med's effects and SE's. Screening labs and tests as requested with regular follow-up as recommended.  I provided 20 minutes of face-to-face time during this encounter including counseling, chart review, and critical decision making was preformed.   Future Appointments  Date Time Provider [place]  03/04/2022  9:00 AM [name], MD [facility] None       Subjective:  Jeffrey Carey is a 76 y.o. male who presents for a 3 month follow up for HTN, hyperlipidemia, glucose management, and vitamin D Def.   Overall he reports doing well today.  He has no additional concerns or complaints.  He is tearful as the Christmas holiday approaches due to the passing of his wife this year 01/2021.   Feels as though  his mood is currently well managed, does not feel depressed, still has want to get out and do things.  Has a friend that he visits often that is supportive.   he has a diagnosis of GERD which is currently managed by lifestyle, hasn't needed omeprazole (OTC) in over 6 months.   BMI is Body mass index is 31.42 kg/m., he has not been working on diet and exercise Wt Readings from Last 3 Encounters:  12/09/21 219 lb (99.3 kg)  08/30/21 201 lb 3.2 oz (91.3 kg)  05/25/21 215 lb 3.2 oz (97.6 kg)   His blood pressure has been controlled at home (130s/low 80s), today their BP is above goal BP: (!) 160/92   He does workout. He denies chest pain, shortness of breath, dizziness.  He follows with Nephrology.  Losartan has been discontinued.    He is on cholesterol medication (pravastatin 40 mg daily) and denies myalgias. His cholesterol is at goal. The cholesterol last visit was:   Lab Results  Component Value Date   CHOL 124 08/30/2021   HDL 34 (L) 08/30/2021   LDLCALC 68 08/30/2021   TRIG 132 08/30/2021   CHOLHDL 3.6 08/30/2021   He has been working on diet and exercise for history of prediabetes (last elevation 5.7% in 2017), and denies increased appetite, nausea, paresthesia of the feet, polydipsia, polyuria, visual disturbances, vomiting and weight loss. Last A1C in the office was:  Lab Results  Component Value Date   HGBA1C 5.4 08/30/2021   He has CKD IIIb without albuminuria monitored at nephrology, Dr. Peeples following annually, we monitor in between, getting 3-4+ bottles of water daily:  Lab Results  Component Value Date   EGFR 37 (L) 08/30/2021   EGFR 45 (L) 05/25/2021   EGFR 44 (L) 02/10/2021    Patient is on Vitamin D supplement.   Lab Results  Component Value Date   VD25OH 94 08/30/2021      Medication Review:   Current Outpatient Medications (Cardiovascular):    atenolol (TENORMIN) 100 MG tablet, TAKE 1 TABLET BY MOUTH EVERY DAY FOR BLOOD PRESSURE    pravastatin (PRAVACHOL) 40 MG tablet, TAKE 1 TABLET BY MOUTH EVERY DAY AT BEDTIME FOR CHOLESTEROL   losartan (COZAAR) 25 MG tablet, TAKE 1 TABLET (25 MG TOTAL) BY MOUTH DAILY. FOR BLOOD PRESSURE AND KIDNEY PROTECTION. (Patient not taking: Reported on 12/09/2021)   Current Outpatient Medications (Analgesics):    aspirin 81 MG tablet, Take 81 mg by mouth at bedtime.    Current Outpatient Medications (Other):    CINNAMON PO, Take 1,000 mg by mouth daily.   Flaxseed, Linseed, (FLAXSEED OIL PO), Take 1 capsule by mouth daily.   Magnesium 250 MG TABS, Take by mouth.   Omega-3 Fatty Acids (FISH OIL PO), Take 1,200 mg by mouth at bedtime.    VITAMIN D PO, Take 5,000 Units by mouth daily.  Allergies: Allergies  Allergen Reactions   Ceftin [Cefuroxime Axetil] Hives   Peanut-Containing Drug Products Other (See Comments)    Break out on nose     Current Problems (verified) has History of colonic polyps; Essential hypertension; Hyperlipidemia, mixed; Abnormal glucose; Vitamin D deficiency; Gastroesophageal reflux disease; Obesity (BMI 30.0-34.9); CKD (chronic kidney disease) stage 3, GFR 30-59 ml/min (HCC); History of prediabetes; and ED (erectile dysfunction) on their problem list.  Screening Tests Immunization History  Administered Date(s) Administered   Influenza Whole 10/10/2012   Influenza, High Dose Seasonal PF 11/07/2016, 10/26/2017, 10/09/2018, 10/16/2019, 10/08/2020, 10/19/2021   Influenza-Unspecified 11/04/2014     PFIZER Comirnaty(Gray Top)Covid-19 Tri-Sucrose Vaccine 07/16/2020   PFIZER(Purple Top)SARS-COV-2 Vaccination 02/14/2019, 03/11/2019, 11/16/2019   Pneumococcal Conjugate-13 02/05/2014   Pneumococcal Polysaccharide-23 07/10/2012   Td 03/07/2006   Tdap 05/25/2021   Health Maintenance  Topic Date Due   Medicare Annual Wellness (AWV)  Never done   Zoster Vaccines- Shingrix (1 of 2) Never done   COVID-19 Vaccine (5 - 2023-24 season) 09/03/2021   COLONOSCOPY (Pts 45-49yrs  Insurance coverage will need to be confirmed)  06/28/2022   DTaP/Tdap/Td (3 - Td or Tdap) 05/26/2031   Pneumonia Vaccine 65+ Years old  Completed   INFLUENZA VACCINE  Completed   Hepatitis C Screening  Completed   HPV VACCINES  Aged Out     Surgical: He  has a past surgical history that includes Laparoscopic cholecystectomy (10/2010); Hammer toe surgery (2012); Anterior cervical decomp/discectomy fusion (N/A, 04/30/2014); and Colonoscopy. Family His family history includes Alzheimer's disease in his father; Colon cancer (age of onset: 75) in his father and mother; Diabetes in his father; Ovarian cancer in his mother; Prostate cancer in his father. Social history  He reports that he has never smoked. He has never used smokeless tobacco. He reports that he does not drink alcohol and does not use drugs.  Objective:   Today's Vitals   12/09/21 0904  BP: (!) 160/92  Pulse: (!) 54  Temp: (!) 97.3 F (36.3 C)  SpO2: 99%  Weight: 219 lb (99.3 kg)  Height: 5' 10" (1.778 m)     Body mass index is 31.42 kg/m.  General appearance: alert, no distress, WD/WN, male HEENT: normocephalic, sclerae anicteric, TMs pearly, nares patent, no discharge or erythema, pharynx normal Oral cavity: MMM, no lesions Neck: supple, no lymphadenopathy, no thyromegaly, no masses Heart: RRR, normal S1, S2, no murmurs Lungs: CTA bilaterally, no wheezes, rhonchi, or rales Abdomen: +bs, soft, non tender, non distended, no masses, no hepatomegaly, no splenomegaly Musculoskeletal: nontender, no swelling, no obvious deformity.  Extremities: no edema, no cyanosis, no clubbing Pulses: 2+ symmetric, upper and lower extremities, normal cap refill Neurological: alert, oriented x 3, CN2-12 intact, strength normal upper extremities and lower extremities, sensation normal throughout, DTRs 2+ throughout, no cerebellar signs, gait normal Psychiatric: normal affect, behavior normal, pleasant   TONYA CRANFORD, NP   12/09/2021   

## 2021-12-10 LAB — COMPLETE METABOLIC PANEL WITH GFR
AG Ratio: 1.6 (calc) (ref 1.0–2.5)
ALT: 17 U/L (ref 9–46)
AST: 19 U/L (ref 10–35)
Albumin: 4.6 g/dL (ref 3.6–5.1)
Alkaline phosphatase (APISO): 60 U/L (ref 35–144)
BUN/Creatinine Ratio: 16 (calc) (ref 6–22)
BUN: 27 mg/dL — ABNORMAL HIGH (ref 7–25)
CO2: 25 mmol/L (ref 20–32)
Calcium: 9.7 mg/dL (ref 8.6–10.3)
Chloride: 103 mmol/L (ref 98–110)
Creat: 1.7 mg/dL — ABNORMAL HIGH (ref 0.70–1.28)
Globulin: 2.8 g/dL (calc) (ref 1.9–3.7)
Glucose, Bld: 91 mg/dL (ref 65–99)
Potassium: 5.3 mmol/L (ref 3.5–5.3)
Sodium: 139 mmol/L (ref 135–146)
Total Bilirubin: 0.5 mg/dL (ref 0.2–1.2)
Total Protein: 7.4 g/dL (ref 6.1–8.1)
eGFR: 42 mL/min/{1.73_m2} — ABNORMAL LOW (ref >=60)

## 2021-12-10 LAB — CBC WITH DIFFERENTIAL/PLATELET
Absolute Monocytes: 527 cells/uL (ref 200–950)
Basophils Absolute: 73 cells/uL (ref 0–200)
Basophils Relative: 0.9 %
Eosinophils Absolute: 259 cells/uL (ref 15–500)
Eosinophils Relative: 3.2 %
HCT: 54.1 % — ABNORMAL HIGH (ref 38.5–50.0)
Hemoglobin: 18.3 g/dL — ABNORMAL HIGH (ref 13.2–17.1)
Lymphs Abs: 1661 cells/uL (ref 850–3900)
MCH: 30.2 pg (ref 27.0–33.0)
MCHC: 33.8 g/dL (ref 32.0–36.0)
MCV: 89.3 fL (ref 80.0–100.0)
MPV: 8.5 fL (ref 7.5–12.5)
Monocytes Relative: 6.5 %
Neutro Abs: 5581 cells/uL (ref 1500–7800)
Neutrophils Relative %: 68.9 %
Platelets: 285 10*3/uL (ref 140–400)
RBC: 6.06 10*6/uL — ABNORMAL HIGH (ref 4.20–5.80)
RDW: 12.7 % (ref 11.0–15.0)
Total Lymphocyte: 20.5 %
WBC: 8.1 10*3/uL (ref 3.8–10.8)

## 2021-12-12 ENCOUNTER — Other Ambulatory Visit: Payer: Self-pay | Admitting: Nurse Practitioner

## 2021-12-12 DIAGNOSIS — N1832 Chronic kidney disease, stage 3b: Secondary | ICD-10-CM

## 2021-12-12 DIAGNOSIS — I10 Essential (primary) hypertension: Secondary | ICD-10-CM

## 2021-12-17 DIAGNOSIS — H02831 Dermatochalasis of right upper eyelid: Secondary | ICD-10-CM | POA: Diagnosis not present

## 2021-12-17 DIAGNOSIS — H02834 Dermatochalasis of left upper eyelid: Secondary | ICD-10-CM | POA: Diagnosis not present

## 2021-12-17 DIAGNOSIS — H0102B Squamous blepharitis left eye, upper and lower eyelids: Secondary | ICD-10-CM | POA: Diagnosis not present

## 2021-12-17 DIAGNOSIS — H1045 Other chronic allergic conjunctivitis: Secondary | ICD-10-CM | POA: Diagnosis not present

## 2021-12-17 DIAGNOSIS — H2513 Age-related nuclear cataract, bilateral: Secondary | ICD-10-CM | POA: Diagnosis not present

## 2021-12-17 DIAGNOSIS — H11122 Conjunctival concretions, left eye: Secondary | ICD-10-CM | POA: Diagnosis not present

## 2021-12-17 DIAGNOSIS — H0102A Squamous blepharitis right eye, upper and lower eyelids: Secondary | ICD-10-CM | POA: Diagnosis not present

## 2021-12-19 ENCOUNTER — Other Ambulatory Visit: Payer: Self-pay | Admitting: Internal Medicine

## 2021-12-19 ENCOUNTER — Encounter: Payer: Self-pay | Admitting: Internal Medicine

## 2021-12-19 MED ORDER — DEXAMETHASONE 4 MG PO TABS: ORAL_TABLET | ORAL | 0 refills | Status: DC

## 2022-01-06 ENCOUNTER — Encounter: Payer: Self-pay | Admitting: Internal Medicine

## 2022-01-12 DIAGNOSIS — N1832 Chronic kidney disease, stage 3b: Secondary | ICD-10-CM | POA: Diagnosis not present

## 2022-01-21 ENCOUNTER — Ambulatory Visit (INDEPENDENT_AMBULATORY_CARE_PROVIDER_SITE_OTHER): Payer: Medicare Other | Admitting: Nurse Practitioner

## 2022-01-21 ENCOUNTER — Encounter: Payer: Self-pay | Admitting: Nurse Practitioner

## 2022-01-21 VITALS — BP 130/80 | HR 57 | Temp 98.1°F | Ht 70.0 in | Wt 217.0 lb

## 2022-01-21 DIAGNOSIS — N1832 Chronic kidney disease, stage 3b: Secondary | ICD-10-CM | POA: Diagnosis not present

## 2022-01-21 DIAGNOSIS — I129 Hypertensive chronic kidney disease with stage 1 through stage 4 chronic kidney disease, or unspecified chronic kidney disease: Secondary | ICD-10-CM | POA: Diagnosis not present

## 2022-01-21 DIAGNOSIS — H6691 Otitis media, unspecified, right ear: Secondary | ICD-10-CM

## 2022-01-21 DIAGNOSIS — I10 Essential (primary) hypertension: Secondary | ICD-10-CM

## 2022-01-21 DIAGNOSIS — E875 Hyperkalemia: Secondary | ICD-10-CM | POA: Diagnosis not present

## 2022-01-21 DIAGNOSIS — E785 Hyperlipidemia, unspecified: Secondary | ICD-10-CM | POA: Diagnosis not present

## 2022-01-21 MED ORDER — AZITHROMYCIN 250 MG PO TABS: ORAL_TABLET | ORAL | 1 refills | Status: DC

## 2022-01-21 NOTE — Progress Notes (Signed)
Assessment and Plan:  Jeffrey Carey was seen today for acute visit.  Diagnoses and all orders for this visit:  Essential hypertension - continue medications, DASH diet, exercise and monitor at home. Call if greater than 130/80.   Otitis of right ear Mucinex q 12 hours Push fluids Continue Flonase Z-pak as directed If no improvement  in the next 5 days notify the office -     azithromycin (ZITHROMAX) 250 MG tablet; Take 2 tablets (500 mg) on  Day 1,  followed by 1 tablet (250 mg) once daily on Days 2 through 5.       Further disposition pending results of labs. Discussed med's effects and SE's.   Over 30 minutes of exam, counseling, chart review, and critical decision making was performed.   Future Appointments  Date Time Provider Jeffrey Carey  03/17/2022 11:00 AM Jeffrey Pinto, MD [facility] None    ------------------------------------------------------------------------------------------------------------------   HPI BP 130/80   Pulse (!) 57   Temp 98.1 F (36.7 C)   Ht '5\' 10"'$  (1.778 m)   Wt 217 lb (98.4 kg)   SpO2 99%   BMI 31.14 kg/m   77 y.o.male presents for persistent head congestion and headache, was treated for Covid 12/19/21 with Dexamethasone taper. Continues to feel like his ears are popping/ clooged/ some tenderness. Denies cough, fevers, nasal congestion.    BP is currently well controlled with Atenolol 100 mg QD.  Denies  chest pain, shortness of breath and dizziness BP Readings from Last 3 Encounters:  01/21/22 130/80  12/09/21 (!) 160/92  08/30/21 (!) 140/90   BMI is Body mass index is 31.14 kg/m.,  Wt Readings from Last 3 Encounters:  01/21/22 217 lb (98.4 kg)  12/09/21 219 lb (99.3 kg)  08/30/21 201 lb 3.2 oz (91.3 kg)     Past Medical History:  Diagnosis Date   Chronic kidney disease    Dyslipidemia    ED (erectile dysfunction)    GERD (gastroesophageal reflux disease)    History of kidney stones    Hyperlipidemia     Hypertension    Radiculopathy 04/30/2014   Vitamin D deficiency      Allergies  Allergen Reactions   Ceftin [Cefuroxime Axetil] Hives   Peanut-Containing Drug Products Other (See Comments)    Break out on nose     Current Outpatient Medications on File Prior to Visit  Medication Sig   aspirin 81 MG tablet Take 81 mg by mouth at bedtime.    atenolol (TENORMIN) 100 MG tablet TAKE 1 TABLET BY MOUTH EVERY DAY FOR BLOOD PRESSURE   CINNAMON PO Take 1,000 mg by mouth daily.   Flaxseed, Linseed, (FLAXSEED OIL PO) Take 1 capsule by mouth daily.   Magnesium 250 MG TABS Take by mouth.   Omega-3 Fatty Acids (FISH OIL PO) Take 1,200 mg by mouth at bedtime.    pravastatin (PRAVACHOL) 40 MG tablet TAKE 1 TABLET BY MOUTH EVERY DAY AT BEDTIME FOR CHOLESTEROL   VITAMIN D PO Take 5,000 Units by mouth daily.   dexamethasone (DECADRON) 4 MG tablet Take 1 tab 3 x /day for 2 days,      then 2 x /day for 2  Days,     then 1 tab daily   No current facility-administered medications on file prior to visit.    ROS: all negative except above.   Physical Exam:  BP 130/80   Pulse (!) 57   Temp 98.1 F (36.7 C)   Ht '5\' 10"'$  (1.778 m)  Wt 217 lb (98.4 kg)   SpO2 99%   BMI 31.14 kg/m   General Appearance: Well nourished, in no apparent distress. Eyes: PERRLA, EOMs, conjunctiva no swelling or erythema Sinuses: No Frontal/maxillary tenderness ENT/Mouth: Ext aud canals clear, R TM with erythema, bulging.  L TM no erythema/bulging. No erythema, swelling, or exudate on post pharynx.  Hearing normal.  Neck: Supple, thyroid normal.  Respiratory: Respiratory effort normal, BS equal bilaterally without rales, rhonchi, wheezing or stridor.  Cardio: RRR with no MRGs. Brisk peripheral pulses without edema.  Abdomen: Soft, + BS.  Non tender, no guarding, rebound, hernias, masses. Lymphatics:Positive right submandibular adenopathy Musculoskeletal: Full ROM, 5/5 strength, normal gait.  Skin: Warm, dry without  rashes, lesions, ecchymosis.  Neuro: Cranial nerves intact. Normal muscle tone, no cerebellar symptoms. Sensation intact.  Psych: Awake and oriented X 3, normal affect, Insight and Judgment appropriate.     Jeffrey Rossetti, NP 11:34 AM Jeffrey Carey

## 2022-01-21 NOTE — Patient Instructions (Signed)
Otitis Media, Adult  Otitis media is a condition in which the middle ear is red and swollen (inflamed) and full of fluid. The middle ear is the part of the ear that contains bones for hearing as well as air that helps send sounds to the brain. The condition usually goes away on its own. What are the causes? This condition is caused by a blockage in the eustachian tube. This tube connects the middle ear to the back of the nose. It normally allows air into the middle ear. The blockage is caused by fluid or swelling. Problems that can cause blockage include: A cold or infection that affects the nose, mouth, or throat. Allergies. An irritant, such as tobacco smoke. Adenoids that have become large. The adenoids are soft tissue located in the back of the throat, behind the nose and the roof of the mouth. Growth or swelling in the upper part of the throat, just behind the nose (nasopharynx). Damage to the ear caused by a change in pressure. This is called barotrauma. What increases the risk? You are more likely to develop this condition if you: Smoke or are exposed to tobacco smoke. Have an opening in the roof of your mouth (cleft palate). Have acid reflux. Have problems in your body's defense system (immune system). What are the signs or symptoms? Symptoms of this condition include: Ear pain. Fever. Problems with hearing. Being tired. Fluid leaking from the ear. Ringing in the ear. How is this treated? This condition can go away on its own within 3-5 days. But if the condition is caused by germs (bacteria) and does not go away on its own, or if it keeps coming back, your doctor may: Give you antibiotic medicines. Give you medicines for pain. Follow these instructions at home: Take over-the-counter and prescription medicines only as told by your doctor. If you were prescribed an antibiotic medicine, take it as told by your doctor. Do not stop taking it even if you start to feel better. Keep  all follow-up visits. Contact a doctor if: You have bleeding from your nose. There is a lump on your neck. You are not feeling better in 5 days. You feel worse instead of better. Get help right away if: You have pain that is not helped with medicine. You have swelling, redness, or pain around your ear. You get a stiff neck. You cannot move part of your face (paralysis). You notice that the bone behind your ear hurts when you touch it. You get a very bad headache. Summary Otitis media means that the middle ear is red, swollen, and full of fluid. This condition usually goes away on its own. If the problem does not go away, treatment may be needed. You may be given medicines to treat the infection or to treat your pain. If you were prescribed an antibiotic medicine, take it as told by your doctor. Do not stop taking it even if you start to feel better. Keep all follow-up visits. This information is not intended to replace advice given to you by your health care provider. Make sure you discuss any questions you have with your health care provider. Document Revised: 03/30/2020 Document Reviewed: 03/30/2020 Elsevier Patient Education  [employer] [place].

## 2022-02-16 ENCOUNTER — Encounter: Payer: Medicare Other | Admitting: Internal Medicine

## 2022-03-04 ENCOUNTER — Encounter: Payer: Medicare Other | Admitting: Internal Medicine

## 2022-03-16 ENCOUNTER — Encounter: Payer: Self-pay | Admitting: Internal Medicine

## 2022-03-16 NOTE — Progress Notes (Signed)
Annual  Screening/Preventative Visit  & Comprehensive Evaluation & Examination   Future Appointments  Date Time Provider Department  03/17/2022                         cpe 11:00 AM Lucky Cowboy, MD [facility]  03/23/2023                         cpe 11:00 AM Lucky Cowboy, MD [facility]            This very nice 77 y.o.  recently widowed WM presents for a Screening /Preventative Visit & comprehensive evaluation and management of multiple medical co-morbidities.  Patient has been followed for HTN, HLD, Prediabetes and Vitamin D Deficiency.       HTN predates circa 2006. Patient's BP has been controlled at home.  Patient has Stage 3b CKD  (GFR 39) .  Today's BP is at goal - 128/80. Patient denies any cardiac symptoms as chest pain, palpitations, shortness of breath, dizziness or ankle swelling.       Patient's hyperlipidemia is controlled with diet and Pravastatin.  Patient denies myalgias or other medication SE's. Last lipids were at goal  : .                                                                                                                                                                                                                                                         Lab Results  Component Value Date   CHOL 124 08/30/2021   HDL 34 (L) 08/30/2021   LDLCALC 68 08/30/2021   TRIG 132 08/30/2021   CHOLHDL 3.6 08/30/2021         Patient has hx/o prediabetes (A1c 5.7% /2011) and patient denies reactive hypoglycemic symptoms, visual blurring, diabetic polys or paresthesias. Last A1c was normal & at goal :   Lab Results  Component Value Date   HGBA1C 5.4 08/30/2021         Finally, patient has history of Vitamin D Deficiency ("24" /2008) and last vitamin D was at goal :   Lab Results  Component Value Date   VD25OH 94 08/30/2021       Current Outpatient Medications  Medication Instructions   Aspirin  81 mg,  Daily at bedtime   atenolol 100 MG tablet  TAKE 1 TABLET EVERY DAY    CINNAMON 1,000 mg Daily   FLAXSEED  1 capsule Daily   Magnesium 250 MG TABS Daily   Omega-3  FISH OIL  1,200 mg Daily at bedtime   pravastatin 40 MG tablet TAKE 1 TABLET EVERY DAY   VITAMIN D    5,000 Units Daily     Allergies  Allergen Reactions   Ceftin [Cefuroxime Axetil] Hives   Peanut-Containing Drug Products Break out on nose         Past Medical History:  Diagnosis Date   Chronic kidney disease    Dyslipidemia    ED (erectile dysfunction)    GERD (gastroesophageal reflux disease)    History of kidney stones    Hyperlipidemia    Hypertension    Radiculopathy 04/30/2014   Vitamin D deficiency      Health Maintenance  Topic Date Due   Zoster Vaccines- Shingrix (1 of 2) Never done   COVID-19 Vaccine (5 - Booster for Pfizer series) 09/10/2020   TETANUS/TDAP  05/04/2021 (Originally 06/14/2016)   COLONOSCOPY 06/28/2022   Pneumonia Vaccine 31+ Years old  Completed   INFLUENZA VACCINE  Completed   Hepatitis C Screening  Completed   HPV VACCINES  Aged Out     Immunization History  Administered Date(s) Administered   Influenza Whole 10/10/2012   Influenza, High Dose  10/09/2018, 10/16/2019, 10/08/2020   Influenza 11/04/2014   PFIZER Cov-19 Vacc 07/16/2020   PFIZER  SARS-COV-2 Vacc 02/14/2019, 03/11/2019, 11/16/2019   Pneumococcal -13 02/05/2014   Pneumococcal -23 07/10/2012   Td 03/07/2006    Last Colon  -  06/28/2019 -  Dr Myrtie Neither - Recc 3 year f/u due July 2024   Past Surgical History:  Procedure Laterality Date   ANTERIOR CERVICAL DECOMP/DISCECTOMY FUSION N/A 04/30/2014   Procedure: ANTERIOR CERVICAL DECOMPRESSION/DISCECTOMY FUSION 1 LEVEL;  Surgeon: Estill Bamberg, MD;  Location: [facility] OR;  Service: Orthopedics;  Laterality: N/A;  Anterior cervical decompression fusion, cervical 7-thoracic 1 with instrumentation and allograft   COLONOSCOPY     HAMMER TOE SURGERY  2012   left foot, 3rd toe   LAPAROSCOPIC CHOLECYSTECTOMY  10/2010      Family History  Problem Relation Age of Onset   Colon cancer Mother 11   Ovarian cancer Mother    Colon cancer Father 44   Diabetes Father    Alzheimer's disease Father    Prostate cancer Father    Stomach cancer Neg Hx    Colon polyps Neg Hx    Esophageal cancer Neg Hx    Rectal cancer Neg Hx      Social History   Tobacco Use   Smoking status: Never   Smokeless tobacco: Never  Vaping Use   Vaping Use: Never used  Substance Use Topics   Alcohol use: No   Drug use: No      ROS Constitutional: Denies fever, chills, weight loss/gain, headaches, insomnia,  night sweats or change in appetite. Does c/o fatigue. Eyes: Denies redness, blurred vision, diplopia, discharge, itchy or watery eyes.  ENT: Denies discharge, congestion, post nasal drip, epistaxis, sore throat, earache, hearing loss, dental pain, Tinnitus, Vertigo, Sinus pain or snoring.  Cardio: Denies chest pain, palpitations, irregular heartbeat, syncope, dyspnea, diaphoresis, orthopnea, PND, claudication or edema Respiratory: denies cough, dyspnea, DOE, pleurisy, hoarseness, laryngitis or wheezing.  Gastrointestinal: Denies dysphagia, heartburn, reflux, water brash, pain, cramps, nausea, vomiting, bloating, diarrhea, constipation,  hematemesis, melena, hematochezia, jaundice or hemorrhoids Genitourinary: Denies dysuria, frequency, discharge, hematuria or flank pain. Has urgency, nocturia x 2-3 & occasional hesitancy. Musculoskeletal: Denies arthralgia, myalgia, stiffness, Jt. Swelling, pain, limp or strain/sprain. Denies Falls. Skin: Denies puritis, rash, hives, warts, acne, eczema or change in skin lesion Neuro: No weakness, tremor, incoordination, spasms, paresthesia or pain Psychiatric: Denies confusion, memory loss or sensory loss. Denies Depression. Endocrine: Denies change in weight, skin, hair change, nocturia, and paresthesia, diabetic polys, visual blurring or hyper / hypo glycemic episodes.  Heme/Lymph: No  excessive bleeding, bruising or enlarged lymph nodes.   Physical Exam  BP 128/80   Pulse 63   Temp 97.6 F (36.4 C)   Resp 17   Ht 5\' 10"  (1.778 m)   Wt 220 lb (99.8 kg)   SpO2 98%   BMI 31.57 kg/m   General Appearance: Well nourished and well groomed and in no apparent distress.  Eyes: PERRLA, EOMs, conjunctiva no swelling or erythema, normal fundi and vessels. Sinuses: No frontal/maxillary tenderness ENT/Mouth: EACs patent / TMs  nl. Nares clear without erythema, swelling, mucoid exudates. Oral hygiene is good. No erythema, swelling, or exudate. Tongue normal, non-obstructing. Tonsils not swollen or erythematous. Hearing normal.  Neck: Supple, thyroid not palpable. No bruits, nodes or JVD. Respiratory: Respiratory effort normal.  BS equal and clear bilateral without rales, rhonci, wheezing or stridor. Cardio: Heart sounds are normal with regular rate and rhythm and no murmurs, rubs or gallops. Peripheral pulses are normal and equal bilaterally without edema. No aortic or femoral bruits. Chest: symmetric with normal excursions and percussion.  Abdomen: Soft, with Nl bowel sounds. Nontender, no guarding, rebound, hernias, masses, or organomegaly.  Lymphatics: Non tender without lymphadenopathy.  Musculoskeletal: Full ROM all peripheral extremities, joint stability, 5/5 strength, and normal gait. Skin: Warm and dry without rashes, lesions, cyanosis, clubbing or  ecchymosis.  Neuro: Cranial nerves intact, reflexes equal bilaterally. Normal muscle tone, no cerebellar symptoms. Sensation intact.  Pysch: Alert and oriented X 3 with normal affect, insight and judgment appropriate.   Assessment and Plan  1. Annual Preventative/Screening Exam    2. Essential hypertension  - EKG 12-Lead - [place], RETROPERITNL ABD,  LTD - CBC with Differential/Platelet - COMPLETE METABOLIC PANEL WITH GFR - Magnesium - TSH  3. Hyperlipidemia, mixed  - EKG 12-Lead - [place], RETROPERITNL ABD,  LTD -  Lipid panel - TSH  4. Abnormal glucose  - EKG 12-Lead - [place], RETROPERITNL ABD,  LTD - Hemoglobin A1c - Insulin, random  5. Vitamin D deficiency  - VITAMIN D 25 Hydroxy   6. Stage 3b chronic kidney disease (HCC)  - PTH, intact and calcium  7. BPH with obstruction/lower urinary tract symptoms  - PSA  8. Prostate cancer screening  - PSA  9. Screening for heart disease  - EKG 12-Lead  10. Screening for AAA (aortic abdominal aneurysm)  - [place], RETROPERITNL ABD,  LTD  11. Medication management  - Urinalysis, Routine w reflex microscopic - Microalbumin / creatinine urine ratio - CBC with Differential/Platelet - COMPLETE METABOLIC PANEL WITH GFR - Magnesium - Lipid panel - TSH - Hemoglobin A1c - Insulin, random - VITAMIN D 25 Hydroxy          Patient was counseled in prudent diet, weight control to achieve/maintain BMI less than 25, BP monitoring, regular exercise and medications as discussed.  Discussed med effects and SE's. Routine screening labs and tests as requested with regular follow-up as recommended. Over 40 minutes of exam, counseling,  chart review and high complex critical decision making was performed   Marinus Maw, MD

## 2022-03-16 NOTE — Patient Instructions (Signed)

## 2022-03-17 ENCOUNTER — Ambulatory Visit (INDEPENDENT_AMBULATORY_CARE_PROVIDER_SITE_OTHER): Payer: Medicare Other | Admitting: Internal Medicine

## 2022-03-17 ENCOUNTER — Encounter: Payer: Self-pay | Admitting: Internal Medicine

## 2022-03-17 VITALS — BP 128/80 | HR 63 | Temp 97.6°F | Resp 17 | Ht 70.0 in | Wt 220.0 lb

## 2022-03-17 DIAGNOSIS — Z79899 Other long term (current) drug therapy: Secondary | ICD-10-CM | POA: Diagnosis not present

## 2022-03-17 DIAGNOSIS — N138 Other obstructive and reflux uropathy: Secondary | ICD-10-CM

## 2022-03-17 DIAGNOSIS — Z0000 Encounter for general adult medical examination without abnormal findings: Secondary | ICD-10-CM | POA: Diagnosis not present

## 2022-03-17 DIAGNOSIS — R7309 Other abnormal glucose: Secondary | ICD-10-CM

## 2022-03-17 DIAGNOSIS — I700 Atherosclerosis of aorta: Secondary | ICD-10-CM

## 2022-03-17 DIAGNOSIS — Z136 Encounter for screening for cardiovascular disorders: Secondary | ICD-10-CM

## 2022-03-17 DIAGNOSIS — E782 Mixed hyperlipidemia: Secondary | ICD-10-CM

## 2022-03-17 DIAGNOSIS — E559 Vitamin D deficiency, unspecified: Secondary | ICD-10-CM

## 2022-03-17 DIAGNOSIS — F5101 Primary insomnia: Secondary | ICD-10-CM

## 2022-03-17 DIAGNOSIS — N1832 Chronic kidney disease, stage 3b: Secondary | ICD-10-CM | POA: Diagnosis not present

## 2022-03-17 DIAGNOSIS — Z0001 Encounter for general adult medical examination with abnormal findings: Secondary | ICD-10-CM

## 2022-03-17 DIAGNOSIS — Z125 Encounter for screening for malignant neoplasm of prostate: Secondary | ICD-10-CM

## 2022-03-17 DIAGNOSIS — I10 Essential (primary) hypertension: Secondary | ICD-10-CM

## 2022-03-17 DIAGNOSIS — H6503 Acute serous otitis media, bilateral: Secondary | ICD-10-CM

## 2022-03-17 MED ORDER — DEXAMETHASONE 4 MG PO TABS: ORAL_TABLET | ORAL | 0 refills | Status: DC

## 2022-03-17 MED ORDER — TRAZODONE HCL 150 MG PO TABS: ORAL_TABLET | ORAL | 1 refills | Status: DC

## 2022-03-18 LAB — URINALYSIS, ROUTINE W REFLEX MICROSCOPIC
Bacteria, UA: NONE SEEN /HPF
Bilirubin Urine: NEGATIVE
Glucose, UA: NEGATIVE
Hyaline Cast: NONE SEEN /LPF
Ketones, ur: NEGATIVE
Leukocytes,Ua: NEGATIVE
Nitrite: NEGATIVE
Protein, ur: NEGATIVE
Specific Gravity, Urine: 1.009 (ref 1.001–1.035)
Squamous Epithelial / HPF: NONE SEEN /HPF (ref <=5)
WBC, UA: NONE SEEN /HPF (ref 0–5)
pH: 5.5 (ref 5.0–8.0)

## 2022-03-18 LAB — COMPLETE METABOLIC PANEL WITH GFR
AG Ratio: 1.5 (calc) (ref 1.0–2.5)
ALT: 20 U/L (ref 9–46)
AST: 20 U/L (ref 10–35)
Albumin: 4.4 g/dL (ref 3.6–5.1)
Alkaline phosphatase (APISO): 54 U/L (ref 35–144)
BUN/Creatinine Ratio: 17 (calc) (ref 6–22)
BUN: 28 mg/dL — ABNORMAL HIGH (ref 7–25)
CO2: 28 mmol/L (ref 20–32)
Calcium: 10 mg/dL (ref 8.6–10.3)
Chloride: 105 mmol/L (ref 98–110)
Creat: 1.66 mg/dL — ABNORMAL HIGH (ref 0.70–1.28)
Globulin: 2.9 g/dL (calc) (ref 1.9–3.7)
Glucose, Bld: 66 mg/dL (ref 65–99)
Potassium: 5 mmol/L (ref 3.5–5.3)
Sodium: 142 mmol/L (ref 135–146)
Total Bilirubin: 0.8 mg/dL (ref 0.2–1.2)
Total Protein: 7.3 g/dL (ref 6.1–8.1)
eGFR: 43 mL/min/{1.73_m2} — ABNORMAL LOW (ref >=60)

## 2022-03-18 LAB — CBC WITH DIFFERENTIAL/PLATELET
Absolute Monocytes: 661 cells/uL (ref 200–950)
Basophils Absolute: 70 cells/uL (ref 0–200)
Basophils Relative: 0.8 %
Eosinophils Absolute: 244 cells/uL (ref 15–500)
Eosinophils Relative: 2.8 %
HCT: 50.4 % — ABNORMAL HIGH (ref 38.5–50.0)
Hemoglobin: 16.9 g/dL (ref 13.2–17.1)
Lymphs Abs: 1888 cells/uL (ref 850–3900)
MCH: 29.6 pg (ref 27.0–33.0)
MCHC: 33.5 g/dL (ref 32.0–36.0)
MCV: 88.4 fL (ref 80.0–100.0)
MPV: 8.9 fL (ref 7.5–12.5)
Monocytes Relative: 7.6 %
Neutro Abs: 5838 cells/uL (ref 1500–7800)
Neutrophils Relative %: 67.1 %
Platelets: 277 10*3/uL (ref 140–400)
RBC: 5.7 10*6/uL (ref 4.20–5.80)
RDW: 13.7 % (ref 11.0–15.0)
Total Lymphocyte: 21.7 %
WBC: 8.7 10*3/uL (ref 3.8–10.8)

## 2022-03-18 LAB — MICROALBUMIN / CREATININE URINE RATIO
Creatinine, Urine: 76 mg/dL (ref 20–320)
Microalb Creat Ratio: 5 mcg/mg creat (ref <30)
Microalb, Ur: 0.4 mg/dL

## 2022-03-18 LAB — HEMOGLOBIN A1C
Hgb A1c MFr Bld: 5.7 % of total Hgb — ABNORMAL HIGH (ref <5.7)
Mean Plasma Glucose: 117 mg/dL
eAG (mmol/L): 6.5 mmol/L

## 2022-03-18 LAB — INSULIN, RANDOM: Insulin: 48.9 u[IU]/mL — ABNORMAL HIGH

## 2022-03-18 LAB — TSH: TSH: 1.4 mIU/L (ref 0.40–4.50)

## 2022-03-18 LAB — MAGNESIUM: Magnesium: 2.1 mg/dL (ref 1.5–2.5)

## 2022-03-18 LAB — LIPID PANEL
Cholesterol: 124 mg/dL (ref <200)
HDL: 33 mg/dL — ABNORMAL LOW (ref >=40)
LDL Cholesterol (Calc): 65 mg/dL (calc)
Non-HDL Cholesterol (Calc): 91 mg/dL (calc) (ref <130)
Total CHOL/HDL Ratio: 3.8 (calc) (ref <5.0)
Triglycerides: 187 mg/dL — ABNORMAL HIGH (ref <150)

## 2022-03-18 LAB — VITAMIN D 25 HYDROXY (VIT D DEFICIENCY, FRACTURES): Vit D, 25-Hydroxy: 98 ng/mL (ref 30–100)

## 2022-03-18 LAB — PTH, INTACT AND CALCIUM
Calcium: 10 mg/dL (ref 8.6–10.3)
PTH: 42 pg/mL (ref 16–77)

## 2022-03-18 LAB — PSA: PSA: 3.69 ng/mL (ref <=4.00)

## 2022-04-27 DIAGNOSIS — L570 Actinic keratosis: Secondary | ICD-10-CM | POA: Diagnosis not present

## 2022-04-27 DIAGNOSIS — L820 Inflamed seborrheic keratosis: Secondary | ICD-10-CM | POA: Diagnosis not present

## 2022-04-27 DIAGNOSIS — L821 Other seborrheic keratosis: Secondary | ICD-10-CM | POA: Diagnosis not present

## 2022-05-11 ENCOUNTER — Other Ambulatory Visit: Payer: Self-pay | Admitting: Nurse Practitioner

## 2022-05-11 DIAGNOSIS — I10 Essential (primary) hypertension: Secondary | ICD-10-CM

## 2022-05-11 DIAGNOSIS — E782 Mixed hyperlipidemia: Secondary | ICD-10-CM

## 2022-05-24 ENCOUNTER — Encounter: Payer: Self-pay | Admitting: Gastroenterology

## 2022-06-08 ENCOUNTER — Encounter: Payer: Self-pay | Admitting: Gastroenterology

## 2022-06-23 ENCOUNTER — Encounter: Payer: Self-pay | Admitting: Gastroenterology

## 2022-06-23 ENCOUNTER — Ambulatory Visit (AMBULATORY_SURGERY_CENTER): Payer: Medicare Other | Admitting: *Deleted

## 2022-06-23 VITALS — Ht 70.0 in | Wt 220.0 lb

## 2022-06-23 DIAGNOSIS — Z86010 Personal history of colonic polyps: Secondary | ICD-10-CM

## 2022-06-23 MED ORDER — NA SULFATE-K SULFATE-MG SULF 17.5-3.13-1.6 GM/177ML PO SOLN
1.0000 | Freq: Once | ORAL | 0 refills | Status: AC
Start: 2022-06-23 — End: 2022-06-23

## 2022-06-23 NOTE — Progress Notes (Signed)

## 2022-07-11 ENCOUNTER — Ambulatory Visit: Payer: Medicare Other | Admitting: Nurse Practitioner

## 2022-07-13 NOTE — Progress Notes (Unsigned)
MEDICARE ANNUAL WELLNESS VISIT AND FOLLOW UP Assessment:    Annual Medicare Wellness Visit Due annually  Health maintenance reviewed Colonoscopy 2021- due 2024 scheduled for next Thursday  Essential hypertension Monitor blood pressure at home; call if consistently over 130/80 Continue DASH diet.   Reminder to go to the ER if any CP, SOB, nausea, dizziness, severe HA, changes vision/speech, left arm numbness and tingling and jaw pain. - CBC  Gastroesophageal reflux disease, esophagitis presence not specified Currently well controlled with medications Discussed diet, avoiding triggers and other lifestyle changes  Vitamin D deficiency At goal at recent check; continue to recommend supplementation for goal of 70-100 Defer vitamin D level  Abnormal glucose Recent A1Cs at goal Discussed diet/exercise, weight management  Defer A1C; check CMP  Obesity (BMI 30.0-34.9) Long discussion about weight loss, diet, and exercise Recommended diet heavy in fruits and veggies and low in animal meats, cheeses, and dairy products, appropriate calorie intake Patient will work on cutting sugar, watching portions Discussed appropriate weight for height and initial goal (<200 lb) Follow up at next visit  Medication management CBC, CMP/GFR, magnesium, TSH  Hyperlipidemia Continue medications: pravastatin 40 mg daily Continue low cholesterol diet and exercise.  Check lipid panel.   History of colonic polyps  UTD colonoscopy; high fiber diet encouraged  CKD (chronic kidney disease) stage 3b, GFR 30-44 ml/min (HCC) Increase fluids, avoid NSAIDS, monitor sugars, will monitor Nephrology following - CMP/GFR  Erectile dysfunction No longer needing med at this time. Sildenafil worked well if needing to restart.      Over 30 minutes of exam, counseling, chart review, and critical decision making was performed  Future Appointments  Date Time Provider Department Center  07/21/2022  8:30 AM  Danis, Andreas Blower, MD [facility]  10/12/2022  9:30 AM Lucky Cowboy, MD [facility] None  01/16/2023  9:30 AM Raynelle Dick, NP [facility] None  04/19/2023 10:00 AM Lucky Cowboy, MD [facility] None  07/18/2023  2:30 PM Raynelle Dick, NP [facility] None     Plan:   During the course of the visit the patient was educated and counseled about appropriate screening and preventive services including:   Pneumococcal vaccine  Influenza vaccine Prevnar 13 Td vaccine Screening electrocardiogram Colorectal cancer screening Diabetes screening Glaucoma screening Nutrition counseling    Subjective:  Jeffrey Carey is a 77 y.o. male who presents for Medicare Annual Wellness Visit and 3 month follow up for HTN, hyperlipidemia, glucose management, and vitamin D Def.   His wife was diagnosed with breast cancer in 2020, mets to liver and passed early 2023 after brief hospice. Daughter lives close by and does things with friends.   He did have a fall last week, foot caught on cement statue in garden and he fell onto his right shoulder/face. No injuries.    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 He is doing lots of yard work. He is limiting red meat and does try to limit simple carbs.  Wt Readings from Last 3 Encounters:  07/14/22 224 lb 12.8 oz (102 kg)  06/23/22 220 lb (99.8 kg)  03/17/22 220 lb (99.8 kg)   His blood pressure has been controlled at home (120s/low 80s), currently on Atenolol 100 mg every day, today their BP is BP: 138/80  BP Readings from Last 3 Encounters:  07/14/22 138/80  03/17/22 128/80  01/21/22 130/80  He does workout. He denies chest pain, shortness of breath, dizziness.   He is on cholesterol medication (pravastatin 40 mg daily) and denies myalgias. His cholesterol is at goal. The cholesterol last visit was:    Lab Results  Component Value Date   CHOL 124 03/17/2022   HDL 33 (L) 03/17/2022   LDLCALC 65 03/17/2022   TRIG 187 (H) 03/17/2022   CHOLHDL 3.8 03/17/2022   He has been working on diet and exercise for history of prediabetes (last elevation 5.7% in 2017), and denies increased appetite, nausea, paresthesia of the feet, polydipsia, polyuria, visual disturbances, vomiting and weight loss. Last A1C in the office was:  Lab Results  Component Value Date   HGBA1C 5.7 (H) 03/17/2022   He has CKD IIIb without albuminuria monitored at nephrology, Dr. Valentino Nose following annually, we monitor in between, getting 4-5 20 ounce  bottles of water daily:  Lab Results  Component Value Date   EGFR 43 (L) 03/17/2022   EGFR 42 (L) 12/09/2021   EGFR 37 (L) 08/30/2021    Patient is on Vitamin D supplement.   Lab Results  Component Value Date   VD25OH 98 03/17/2022      Medication Review:   Current Outpatient Medications (Cardiovascular):    atenolol (TENORMIN) 100 MG tablet, TAKE 1 TABLET BY MOUTH EVERY DAY FOR BLOOD PRESSURE   pravastatin (PRAVACHOL) 40 MG tablet, TAKE 1 TABLET BY MOUTH EVERY DAY AT BEDTIME FOR CHOLESTEROL   Current Outpatient Medications (Analgesics):    Acetaminophen (TYLENOL PO), Take by mouth. PRN   aspirin 81 MG tablet, Take 81 mg by mouth at bedtime.    Current Outpatient Medications (Other):    CINNAMON PO, Take 1,000 mg by mouth daily.   Flaxseed, Linseed, (FLAXSEED OIL PO), Take 1 capsule by mouth daily.   Magnesium 250 MG TABS, Take by mouth.   Omega-3 Fatty Acids (FISH OIL PO), Take 1,200 mg by mouth at bedtime.    VITAMIN D PO, Take 5,000 Units by mouth daily.  Allergies: Allergies  Allergen Reactions   Ceftin [Cefuroxime Axetil] Hives   Peanut-Containing Drug Products Other (See Comments)    Break out on nose     Current Problems (verified) has History of colonic polyps; Essential hypertension; Hyperlipidemia, mixed; Abnormal glucose; Vitamin D  deficiency; Gastroesophageal reflux disease; Obesity (BMI 30.0-34.9); CKD (chronic kidney disease) stage 3, GFR 30-59 ml/min (HCC); History of prediabetes; and ED (erectile dysfunction) on their problem list.  Screening Tests Immunization History  Administered Date(s) Administered   Influenza Whole 10/10/2012   Influenza, High Dose Seasonal PF 11/07/2016, 10/26/2017, 10/09/2018, 10/16/2019, 10/08/2020, 10/19/2021   Influenza-Unspecified 11/04/2014   PFIZER Comirnaty(Gray Top)Covid-19 Tri-Sucrose Vaccine 07/16/2020   PFIZER(Purple Top)SARS-COV-2 Vaccination 02/14/2019, 03/11/2019, 11/16/2019   Pneumococcal Conjugate-13 02/05/2014   Pneumococcal Polysaccharide-23 07/10/2012   Td 03/07/2006   Tdap 05/25/2021   Health Maintenance  Topic Date Due   Colonoscopy  06/28/2022   COVID-19 Vaccine (5 - 2023-24 season) 07/30/2022 (Originally 09/03/2021)   Zoster Vaccines- Shingrix (1 of 2) 10/14/2022 (Originally 08/03/1964)   INFLUENZA VACCINE  08/04/2022   Medicare Annual Wellness (AWV)  07/14/2023   DTaP/Tdap/Td (3 - Td or Tdap) 05/26/2031   Pneumonia Vaccine 72+ Years old  Completed   Hepatitis C Screening  Completed   HPV VACCINES  Aged Out     Names of Other Physician/Practitioners you currently use: 1. [facility] here for primary care 2.  Dr. Dione Booze, eye doctor, last visit 12/2021 3. Dr. Bennett Scrape, dentist, last visit 05/2022  Patient Care Team: Lucky Cowboy, MD as PCP - General (Internal Medicine) Ihor Gully, MD (Inactive) as Consulting Physician (Urology) [facility] (Optometry) Valentino Nose Bernestine Amass, MD as Consulting Physician (Nephrology) Juanetta Gosling, DDS (Dentistry)  Surgical: He  has a past surgical history that includes Laparoscopic cholecystectomy (10/2010); Hammer toe surgery (2012); Anterior cervical decomp/discectomy fusion (N/A, 04/30/2014); Colonoscopy; and Upper gastrointestinal endoscopy. Family His family history includes  Alzheimer's disease in his father; Colon cancer (age of onset: 56) in his father and mother; Diabetes in his father; Ovarian cancer in his mother; Prostate cancer in his father. Social history  He reports that he has never smoked. He has never used smokeless tobacco. He reports that he does not drink alcohol and does not use drugs.  MEDICARE WELLNESS OBJECTIVES: Physical activity:   He does lots of yard work Cardiac risk factors: Cardiac Risk Factors include: advanced age (>64men, >87 women);dyslipidemia;hypertension;male gender;obesity (BMI >30kg/m2);sedentary lifestyle Depression/mood screen:      07/14/2022    2:19 PM  Depression screen PHQ 2/9  Decreased Interest 0  Down, Depressed, Hopeless 0  PHQ - 2 Score 0    ADLs:     07/14/2022    2:18 PM  In your present state of health, do you have any difficulty performing the following activities:  Hearing? 0  Vision? 0  Difficulty concentrating or making decisions? 0  Walking or climbing stairs? 0  Dressing or bathing? 0  Doing errands, shopping? 0     Cognitive Testing  Alert? Yes  Normal Appearance?Yes  Oriented to person? Yes  Place? Yes   Time? Yes  Recall of three objects?  Yes  Can perform simple calculations? Yes  Displays appropriate judgment?Yes  Can read the correct time from a watch face?Yes  EOL planning: Does Patient Have a Medical Advance Directive?: Yes Type of Advance Directive: Healthcare Power of Attorney, Living will Does patient want to make changes to medical advance directive?: No - Patient declined Copy of Healthcare Power of Attorney in Chart?: No - copy requested   Objective:   Today's Vitals   07/14/22 1346  BP: 138/80  Pulse: (!) 57  Temp: 97.7 F (36.5 C)  SpO2: 97%  Weight: 224 lb 12.8 oz (102 kg)  Height: 5\' 10"  (1.778 m)     Body mass index is 32.26 kg/m.  General appearance: alert, no distress, WD/WN, male HEENT: normocephalic, sclerae anicteric, TMs pearly, nares patent, no  discharge or erythema, pharynx normal Oral cavity: MMM, no lesions Neck: supple, no lymphadenopathy, no thyromegaly, no masses Heart: RRR, normal S1, S2, no murmurs Lungs: CTA bilaterally, no wheezes, rhonchi, or rales Abdomen: +bs, soft, non tender, non distended, no masses, no hepatomegaly, no splenomegaly Musculoskeletal: nontender, no swelling, no obvious deformity.  Extremities: no edema, no cyanosis, no clubbing Pulses: 2+ symmetric, upper and lower extremities, normal cap refill Neurological: alert, oriented x 3, CN2-12 intact, strength normal upper extremities and lower extremities, sensation normal throughout, DTRs 2+ throughout, no cerebellar signs, gait normal Psychiatric: normal affect, behavior normal, pleasant   Medicare Attestation I have personally reviewed: The patient's medical and social history Their use of alcohol, tobacco or illicit drugs Their current medications and supplements The patient's functional ability including ADLs,fall risks, home safety risks, cognitive, and hearing and visual impairment Diet and physical activities Evidence for depression or mood disorders  The patient's weight, height, BMI, and visual acuity have  been recorded in the chart.  I have made referrals, counseling, and provided education to the patient based on review of the above and I have provided the patient with a written personalized care plan for preventive services.     Raynelle Dick, NP   07/14/2022

## 2022-07-14 ENCOUNTER — Encounter: Payer: Self-pay | Admitting: Nurse Practitioner

## 2022-07-14 ENCOUNTER — Ambulatory Visit (INDEPENDENT_AMBULATORY_CARE_PROVIDER_SITE_OTHER): Payer: Medicare Other | Admitting: Nurse Practitioner

## 2022-07-14 VITALS — BP 138/80 | HR 57 | Temp 97.7°F | Ht 70.0 in | Wt 224.8 lb

## 2022-07-14 DIAGNOSIS — Z0000 Encounter for general adult medical examination without abnormal findings: Secondary | ICD-10-CM

## 2022-07-14 DIAGNOSIS — N529 Male erectile dysfunction, unspecified: Secondary | ICD-10-CM

## 2022-07-14 DIAGNOSIS — R7309 Other abnormal glucose: Secondary | ICD-10-CM | POA: Diagnosis not present

## 2022-07-14 DIAGNOSIS — I10 Essential (primary) hypertension: Secondary | ICD-10-CM | POA: Diagnosis not present

## 2022-07-14 DIAGNOSIS — R6889 Other general symptoms and signs: Secondary | ICD-10-CM

## 2022-07-14 DIAGNOSIS — E782 Mixed hyperlipidemia: Secondary | ICD-10-CM | POA: Diagnosis not present

## 2022-07-14 DIAGNOSIS — E669 Obesity, unspecified: Secondary | ICD-10-CM

## 2022-07-14 DIAGNOSIS — Z860100 Personal history of colon polyps, unspecified: Secondary | ICD-10-CM

## 2022-07-14 DIAGNOSIS — N1832 Chronic kidney disease, stage 3b: Secondary | ICD-10-CM | POA: Diagnosis not present

## 2022-07-14 DIAGNOSIS — E66811 Obesity, class 1: Secondary | ICD-10-CM

## 2022-07-14 DIAGNOSIS — Z79899 Other long term (current) drug therapy: Secondary | ICD-10-CM | POA: Diagnosis not present

## 2022-07-14 DIAGNOSIS — K219 Gastro-esophageal reflux disease without esophagitis: Secondary | ICD-10-CM | POA: Diagnosis not present

## 2022-07-14 DIAGNOSIS — E559 Vitamin D deficiency, unspecified: Secondary | ICD-10-CM | POA: Diagnosis not present

## 2022-07-14 DIAGNOSIS — Z0001 Encounter for general adult medical examination with abnormal findings: Secondary | ICD-10-CM | POA: Diagnosis not present

## 2022-07-14 NOTE — Patient Instructions (Signed)

## 2022-07-15 LAB — COMPLETE METABOLIC PANEL WITH GFR
AG Ratio: 1.8 (calc) (ref 1.0–2.5)
ALT: 20 U/L (ref 9–46)
AST: 19 U/L (ref 10–35)
Albumin: 4.5 g/dL (ref 3.6–5.1)
Alkaline phosphatase (APISO): 58 U/L (ref 35–144)
BUN/Creatinine Ratio: 12 (calc) (ref 6–22)
BUN: 19 mg/dL (ref 7–25)
CO2: 29 mmol/L (ref 20–32)
Calcium: 9.9 mg/dL (ref 8.6–10.3)
Chloride: 103 mmol/L (ref 98–110)
Creat: 1.63 mg/dL — ABNORMAL HIGH (ref 0.70–1.28)
Globulin: 2.5 g/dL (calc) (ref 1.9–3.7)
Glucose, Bld: 81 mg/dL (ref 65–99)
Potassium: 5.1 mmol/L (ref 3.5–5.3)
Sodium: 140 mmol/L (ref 135–146)
Total Bilirubin: 0.5 mg/dL (ref 0.2–1.2)
Total Protein: 7 g/dL (ref 6.1–8.1)
eGFR: 44 mL/min/{1.73_m2} — ABNORMAL LOW (ref >=60)

## 2022-07-15 LAB — LIPID PANEL
Cholesterol: 127 mg/dL (ref <200)
HDL: 37 mg/dL — ABNORMAL LOW (ref >=40)
LDL Cholesterol (Calc): 64 mg/dL (calc)
Non-HDL Cholesterol (Calc): 90 mg/dL (calc) (ref <130)
Total CHOL/HDL Ratio: 3.4 (calc) (ref <5.0)
Triglycerides: 183 mg/dL — ABNORMAL HIGH (ref <150)

## 2022-07-15 LAB — CBC WITH DIFFERENTIAL/PLATELET
Absolute Monocytes: 647 cells/uL (ref 200–950)
Basophils Absolute: 70 cells/uL (ref 0–200)
Basophils Relative: 0.9 %
Eosinophils Absolute: 218 cells/uL (ref 15–500)
Eosinophils Relative: 2.8 %
HCT: 51.5 % — ABNORMAL HIGH (ref 38.5–50.0)
Hemoglobin: 17.3 g/dL — ABNORMAL HIGH (ref 13.2–17.1)
Lymphs Abs: 1825 cells/uL (ref 850–3900)
MCH: 29.8 pg (ref 27.0–33.0)
MCHC: 33.6 g/dL (ref 32.0–36.0)
MCV: 88.6 fL (ref 80.0–100.0)
MPV: 9 fL (ref 7.5–12.5)
Monocytes Relative: 8.3 %
Neutro Abs: 5039 cells/uL (ref 1500–7800)
Neutrophils Relative %: 64.6 %
Platelets: 278 10*3/uL (ref 140–400)
RBC: 5.81 10*6/uL — ABNORMAL HIGH (ref 4.20–5.80)
RDW: 12.6 % (ref 11.0–15.0)
Total Lymphocyte: 23.4 %
WBC: 7.8 10*3/uL (ref 3.8–10.8)

## 2022-07-15 LAB — MAGNESIUM: Magnesium: 2.3 mg/dL (ref 1.5–2.5)

## 2022-07-15 LAB — TSH: TSH: 2.2 mIU/L (ref 0.40–4.50)

## 2022-07-21 ENCOUNTER — Ambulatory Visit (AMBULATORY_SURGERY_CENTER): Payer: Medicare Other | Admitting: Gastroenterology

## 2022-07-21 ENCOUNTER — Encounter: Payer: Self-pay | Admitting: Gastroenterology

## 2022-07-21 VITALS — BP 135/79 | HR 51 | Temp 97.7°F | Resp 16 | Ht 70.0 in | Wt 220.0 lb

## 2022-07-21 DIAGNOSIS — D122 Benign neoplasm of ascending colon: Secondary | ICD-10-CM

## 2022-07-21 DIAGNOSIS — Z86010 Personal history of colonic polyps: Secondary | ICD-10-CM

## 2022-07-21 DIAGNOSIS — N189 Chronic kidney disease, unspecified: Secondary | ICD-10-CM | POA: Diagnosis not present

## 2022-07-21 DIAGNOSIS — D123 Benign neoplasm of transverse colon: Secondary | ICD-10-CM

## 2022-07-21 DIAGNOSIS — Z09 Encounter for follow-up examination after completed treatment for conditions other than malignant neoplasm: Secondary | ICD-10-CM | POA: Diagnosis not present

## 2022-07-21 DIAGNOSIS — E785 Hyperlipidemia, unspecified: Secondary | ICD-10-CM | POA: Diagnosis not present

## 2022-07-21 DIAGNOSIS — K635 Polyp of colon: Secondary | ICD-10-CM | POA: Diagnosis not present

## 2022-07-21 MED ORDER — SODIUM CHLORIDE 0.9 % IV SOLN: 500.0000 mL | Freq: Once | INTRAVENOUS | Status: DC

## 2022-07-21 NOTE — Progress Notes (Signed)
History and Physical:  This patient presents for endoscopic testing for: Encounter Diagnosis  Name Primary?   Personal history of colonic polyps Yes    Surveillance exam today - Hx TA polyps 2021, TA and SSP 2018, TA 2013 Patient denies chronic abdominal pain, rectal bleeding, constipation or diarrhea.   Patient is otherwise without complaints or active issues today.   Past Medical History: Past Medical History:  Diagnosis Date   Chronic kidney disease    Dyslipidemia    ED (erectile dysfunction)    GERD (gastroesophageal reflux disease)    History of kidney stones    Hyperlipidemia    Hypertension    Radiculopathy 04/30/2014   Vitamin D deficiency      Past Surgical History: Past Surgical History:  Procedure Laterality Date   ANTERIOR CERVICAL DECOMP/DISCECTOMY FUSION N/A 04/30/2014   Procedure: ANTERIOR CERVICAL DECOMPRESSION/DISCECTOMY FUSION 1 LEVEL;  Surgeon: Estill Bamberg, MD;  Location: [facility] OR;  Service: Orthopedics;  Laterality: N/A;  Anterior cervical decompression fusion, cervical 7-thoracic 1 with instrumentation and allograft   COLONOSCOPY     HAMMER TOE SURGERY  2012   left foot, 3rd toe   LAPAROSCOPIC CHOLECYSTECTOMY  10/2010   UPPER GASTROINTESTINAL ENDOSCOPY      Allergies: Allergies  Allergen Reactions   Ceftin [Cefuroxime Axetil] Hives   Peanut-Containing Drug Products Other (See Comments)    Break out on nose     Outpatient Meds: Current Outpatient Medications  Medication Sig Dispense Refill   Acetaminophen (TYLENOL PO) Take by mouth. PRN     aspirin 81 MG tablet Take 81 mg by mouth at bedtime.      atenolol (TENORMIN) 100 MG tablet TAKE 1 TABLET BY MOUTH EVERY DAY FOR BLOOD PRESSURE 90 tablet 3   CINNAMON PO Take 1,000 mg by mouth daily.     Flaxseed, Linseed, (FLAXSEED OIL PO) Take 1 capsule by mouth daily.     Magnesium 250 MG TABS Take by mouth.     Omega-3 Fatty Acids (FISH OIL PO) Take 1,200 mg by mouth at bedtime.      pravastatin  (PRAVACHOL) 40 MG tablet TAKE 1 TABLET BY MOUTH EVERY DAY AT BEDTIME FOR CHOLESTEROL 90 tablet 3   VITAMIN D PO Take 5,000 Units by mouth daily.     Current Facility-Administered Medications  Medication Dose Route Frequency Provider Last Rate Last Admin   0.9 %  sodium chloride infusion  500 mL Intravenous Once Danis, Starr Lake III, MD          ___________________________________________________________________ Objective   Exam:  BP (!) 182/91   Pulse (!) 56   Temp 97.7 F (36.5 C)   Ht 5\' 10"  (1.778 m)   Wt 220 lb (99.8 kg)   SpO2 97%   BMI 31.57 kg/m   CV: regular , S1/S2 Resp: clear to auscultation bilaterally, normal RR and effort noted GI: soft, no tenderness, with active bowel sounds.   Assessment: Encounter Diagnosis  Name Primary?   Personal history of colonic polyps Yes     Plan: Colonoscopy   The benefits and risks of the planned procedure were described in detail with the patient or (when appropriate) their health care proxy.  Risks were outlined as including, but not limited to, bleeding, infection, perforation, adverse medication reaction leading to cardiac or pulmonary decompensation, pancreatitis (if ERCP).  The limitation of incomplete mucosal visualization was also discussed.  No guarantees or warranties were given.  The patient is appropriate for an endoscopic procedure in the  ambulatory setting.   - Amada Jupiter, MD

## 2022-07-21 NOTE — Op Note (Signed)
[facility] Patient Name: Jeffrey Carey Procedure Date: 07/21/2022 8:26 AM MRN: [id number] Endoscopist: Sherilyn Cooter L. Myrtie Neither , MD, [id number] Age: 77 Referring MD:  Date of Birth: March 17, 1945 Gender: Male Account #: [id number] Procedure:                Colonoscopy Indications:              Surveillance: Personal history of adenomatous                            polyps on last colonoscopy 3 years ago Medicines:                Monitored Anesthesia Care Procedure:                Pre-Anesthesia Assessment:                           - Prior to the procedure, a History and Physical                            was performed, and patient medications and                            allergies were reviewed. The patient's tolerance of                            previous anesthesia was also reviewed. The risks                            and benefits of the procedure and the sedation                            options and risks were discussed with the patient.                            All questions were answered, and informed consent                            was obtained. Prior Anticoagulants: The patient has                            taken no anticoagulant or antiplatelet agents. ASA                            Grade Assessment: II - A patient with mild systemic                            disease. After reviewing the risks and benefits,                            the patient was deemed in satisfactory condition to                            undergo the procedure.  After obtaining informed consent, the colonoscope                            was passed under direct vision. Throughout the                            procedure, the patient's blood pressure, pulse, and                            oxygen saturations were monitored continuously. The                            CF [id number] #[id number] was introduced through the anus                            and advanced to the the  cecum, identified by                            appendiceal orifice and ileocecal valve. The                            colonoscopy was performed without difficulty. The                            patient tolerated the procedure well. The quality                            of the bowel preparation was good. The ileocecal                            valve, appendiceal orifice, and rectum were                            photographed. Scope In: 8:44:44 AM Scope Out: 8:58:00 AM Scope Withdrawal Time: 0 hours 9 minutes 41 seconds  Total Procedure Duration: 0 hours 13 minutes 16 seconds  Findings:                 The perianal and digital rectal examinations were                            normal.                           Repeat examination of right colon under NBI                            performed.                           Three sessile and semi-sessile polyps were found in                            the proximal transverse colon, distal transverse  colon and ascending colon. The polyps were 4 to 5                            mm in size. These polyps were removed with a cold                            snare. Resection and retrieval were complete.                           Multiple diverticula were found in the left colon.                           Internal hemorrhoids were found.                           The exam was otherwise without abnormality on                            direct and retroflexion views. Complications:            No immediate complications. Estimated Blood Loss:     Estimated blood loss was minimal. Impression:               - Three 4 to 5 mm polyps in the proximal transverse                            colon, in the distal transverse colon and in the                            ascending colon, removed with a cold snare.                            Resected and retrieved.                           - Diverticulosis in the left colon.                            - Internal hemorrhoids.                           - The examination was otherwise normal on direct                            and retroflexion views. Recommendation:           - Patient has a contact number available for                            emergencies. The signs and symptoms of potential                            delayed complications were discussed with the  patient. Return to normal activities tomorrow.                            Written discharge instructions were provided to the                            patient.                           - Resume previous diet.                           - Continue present medications.                           - Await pathology results.                           - Repeat colonoscopy is recommended for                            surveillance. The colonoscopy date will be                            determined after pathology results from today's                            exam become available for review. Tyge Somers L. Myrtie Neither, MD 07/21/2022 9:02:56 AM This report has been signed electronically.

## 2022-07-21 NOTE — Progress Notes (Signed)
Called to room to assist during endoscopic procedure.  Patient ID and intended procedure confirmed with present staff. Received instructions for my participation in the procedure from the performing physician.  

## 2022-07-21 NOTE — Progress Notes (Signed)
A/O x 3, gd SR's, VSS, report to RN

## 2022-07-21 NOTE — Patient Instructions (Signed)
Resume all of your previous medications today as ordered.  Read al of your handouts given to you by your recovery room nurse.  YOU HAD AN ENDOSCOPIC PROCEDURE TODAY AT THE [facility]:   Refer to the procedure report that was given to you for any specific questions about what was found during the examination.  If the procedure report does not answer your questions, please call your gastroenterologist to clarify.  If you requested that your care partner not be given the details of your procedure findings, then the procedure report has been included in a sealed envelope for you to review at your convenience later.  YOU SHOULD EXPECT: Some feelings of bloating in the abdomen. Passage of more gas than usual.  Walking can help get rid of the air that was put into your GI tract during the procedure and reduce the bloating. If you had a lower endoscopy (such as a colonoscopy or flexible sigmoidoscopy) you may notice spotting of blood in your stool or on the toilet paper. If you underwent a bowel prep for your procedure, you may not have a normal bowel movement for a few days.  Please Note:  You might notice some irritation and congestion in your nose or some drainage.  This is from the oxygen used during your procedure.  There is no need for concern and it should clear up in a day or so.  SYMPTOMS TO REPORT IMMEDIATELY:  Following lower endoscopy (colonoscopy or flexible sigmoidoscopy):  Excessive amounts of blood in the stool  Significant tenderness or worsening of abdominal pains  Swelling of the abdomen that is new, acute  Fever of 100F or higher   For urgent or emergent issues, a gastroenterologist can be reached at any hour by calling [phone]. Do not use MyChart messaging for urgent concerns.    DIET:  We do recommend a small meal at first, but then you may proceed to your regular diet.  Drink plenty of fluids but you should avoid alcoholic beverages for 24 hours. Try to eat  a high fiber diet, and drink plenty of water.  ACTIVITY:  You should plan to take it easy for the rest of today and you should NOT DRIVE or use heavy machinery until tomorrow (because of the sedation medicines used during the test).    FOLLOW UP: Our staff will call the number listed on your records the next business day following your procedure.  We will call around 7:15- 8:00 am to check on you and address any questions or concerns that you may have regarding the information given to you following your procedure. If we do not reach you, we will leave a message.     If any biopsies were taken you will be contacted by phone or by letter within the next 1-3 weeks.  Please call us at [phone] if you have not heard about the biopsies in 3 weeks.    SIGNATURES/CONFIDENTIALITY: You and/or your care partner have signed paperwork which will be entered into your electronic medical record.  These signatures attest to the fact that that the information above on your After Visit Summary has been reviewed and is understood.  Full responsibility of the confidentiality of this discharge information lies with you and/or your care-partner.

## 2022-07-22 ENCOUNTER — Telehealth: Payer: Self-pay | Admitting: *Deleted

## 2022-07-22 NOTE — Telephone Encounter (Signed)
  Follow up Call-     07/21/2022    7:40 AM  Call back number  Post procedure Call Back phone  # [phone]  Permission to leave phone message Yes     Patient questions:  Do you have a fever, pain , or abdominal swelling? No. Pain Score  0 *  Have you tolerated food without any problems? Yes.    Have you been able to return to your normal activities? Yes.    Do you have any questions about your discharge instructions: Diet   No. Medications  No. Follow up visit  No.  Do you have questions or concerns about your Care? No.  Actions: * If pain score is 4 or above: No action needed, pain <4.

## 2022-07-26 ENCOUNTER — Encounter: Payer: Self-pay | Admitting: Gastroenterology

## 2022-09-17 ENCOUNTER — Other Ambulatory Visit: Payer: Self-pay | Admitting: Internal Medicine

## 2022-09-17 DIAGNOSIS — F5101 Primary insomnia: Secondary | ICD-10-CM

## 2022-10-12 ENCOUNTER — Ambulatory Visit: Payer: Medicare Other | Admitting: Internal Medicine

## 2022-10-17 ENCOUNTER — Other Ambulatory Visit: Payer: Self-pay | Admitting: Internal Medicine

## 2022-10-17 ENCOUNTER — Ambulatory Visit (INDEPENDENT_AMBULATORY_CARE_PROVIDER_SITE_OTHER): Payer: Medicare Other | Admitting: Internal Medicine

## 2022-10-17 ENCOUNTER — Encounter: Payer: Self-pay | Admitting: Internal Medicine

## 2022-10-17 VITALS — BP 130/80 | HR 60 | Temp 97.9°F | Resp 16 | Ht 70.0 in | Wt 222.2 lb

## 2022-10-17 DIAGNOSIS — E782 Mixed hyperlipidemia: Secondary | ICD-10-CM

## 2022-10-17 DIAGNOSIS — E559 Vitamin D deficiency, unspecified: Secondary | ICD-10-CM

## 2022-10-17 DIAGNOSIS — N1832 Chronic kidney disease, stage 3b: Secondary | ICD-10-CM

## 2022-10-17 DIAGNOSIS — Z79899 Other long term (current) drug therapy: Secondary | ICD-10-CM

## 2022-10-17 DIAGNOSIS — R7309 Other abnormal glucose: Secondary | ICD-10-CM

## 2022-10-17 DIAGNOSIS — I10 Essential (primary) hypertension: Secondary | ICD-10-CM

## 2022-10-17 DIAGNOSIS — Z23 Encounter for immunization: Secondary | ICD-10-CM

## 2022-10-17 NOTE — Progress Notes (Signed)
Future Appointments  Date Time Provider Department  10/17/2022               6 mo ov  3:30 PM Lucky Cowboy, MD [facility]  01/16/2023                  9 mo ov  9:30 AM Raynelle Dick, NP [facility]  04/19/2023                 cpe 10:00 AM Lucky Cowboy, MD [facility]  07/18/2023                 wellness  2:30 PM Raynelle Dick, NP [facility]    History of Present Illness:      This very nice 77 y.o. WWM presents for 6  month follow up with HTN, HLD, Pre-Diabetes and Vitamin D Deficiency.  (Widowed Jan 23, 2021 from Breast Cancer)        Patient is treated for HTN since 2006  & BP has been controlled at home. Today's BP is  high normal at 130/80 .  Patient has CKD3b (GFR 44). Patient has had no complaints of any cardiac type chest pain, palpitations, dyspnea / orthopnea / PND, dizziness, claudication, or dependent edema.        Hyperlipidemia is controlled with diet & Pravastatin . Patient denies myalgias or other med SE's. Last Lipids were at goal except elevated Trig's :  Lab Results  Component Value Date   CHOL 127 07/14/2022   HDL 37 (L) 07/14/2022   LDLCALC 64 07/14/2022   TRIG 183 (H) 07/14/2022   CHOLHDL 3.4 07/14/2022     Also, the patient has history of PreDiabetes (A1c 5.7% /2011) and has had no symptoms of reactive hypoglycemia, diabetic polys, paresthesias or visual blurring.  Last A1c was near  goal :  Lab Results  Component Value Date   HGBA1C 5.7 (H) 03/17/2022                                                      Further, the patient also has history of Vitamin D Deficiency and supplements vitamin D without any suspected side-effects. Last vitamin D was at goal :  Lab Results  Component Value Date   VD25OH 98 03/17/2022      Current Outpatient Medications  Medication Instructions   Acetaminophen PRN   Aspirin    81 mg Daily   atenolol  100 MG tablet TAKE 1 TABLET EVERY DAY    CINNAMON    1,000 mg,  Daily   FLAXSEED OIL 1 capsule   Daily   Magnesium 250 MG TABS Daily   Omega-3 FISH OIL 1,200 mg  Daily    pravastatin  40 MG tablet TAKE 1 TABLET  AT BEDTIME    trazodone 150 MG tablet  Take  1/2-1 tab 1-2 hrs before Bedtime  as needed   VITAMIN D    5,000 Units  Daily     Allergies  Allergen Reactions   Ceftin [Cefuroxime Axetil] Hives   Peanut-Containing Drug Products Other (See Comments)    Break out on nose      PMHx:   Past Medical History:  Diagnosis Date   Chronic kidney disease    Dyslipidemia    ED (erectile dysfunction)    GERD (gastroesophageal  reflux disease)    History of kidney stones    Hyperlipidemia    Hypertension    Radiculopathy 04/30/2014   Vitamin D deficiency      Immunization History  Administered Date(s) Administered   Influenza Whole 10/10/2012   Influenza, High Dose  10/09/2018, 10/16/2019   Influenza 11/04/2014   PFIZER Comirnaty Covid-19 Vacc 07/16/2020   PFIZER SARS-COV-2 Vacc 02/14/2019, 03/11/2019, 11/16/2019   Pneumococcal - 13 02/05/2014   Pneumococcal - 23 07/10/2012   Td 03/07/2006     Past Surgical History:  Procedure Laterality Date   ANT CERVICAL DECOMP/DISCECTOMY FUSION N/A 04/30/2014   Procedure: ANT CERVICAL DECOMPRESSION/DISCECTOMY FUSION 1 LEVEL;  Surgeon: Estill Bamberg, MD;  Location: [facility] OR;  Service: Orthopedics;  Laterality: N/A;  Anterior cervical decompression fusion, cervical 7-thoracic 1 with instrumentation and allograft   COLONOSCOPY     HAMMER TOE SURGERY  2012   left foot, 3rd toe   LAPAROSCOPIC CHOLECYSTECTOMY  10/2010    FHx:    Reviewed / unchanged   SHx:    Reviewed / unchanged    Systems Review:  Constitutional: Denies fever, chills, wt changes, headaches, insomnia, fatigue, night sweats, change in appetite. Eyes: Denies redness, blurred vision, diplopia, discharge, itchy, watery eyes.  ENT: Denies discharge, congestion, post nasal drip, epistaxis, sore throat, earache, hearing loss, dental pain, tinnitus, vertigo, sinus pain,  snoring.  CV: Denies chest pain, palpitations, irregular heartbeat, syncope, dyspnea, diaphoresis, orthopnea, PND, claudication or edema. Respiratory: denies cough, dyspnea, DOE, pleurisy, hoarseness, laryngitis, wheezing.  Gastrointestinal: Denies dysphagia, odynophagia, heartburn, reflux, water brash, abdominal pain or cramps, nausea, vomiting, bloating, diarrhea, constipation, hematemesis, melena, hematochezia  or hemorrhoids. Genitourinary: Denies dysuria, frequency, urgency, nocturia, hesitancy, discharge, hematuria or flank pain. Musculoskeletal: Denies arthralgias, myalgias, stiffness, jt. swelling, pain, limping or strain/sprain.  Skin: Denies pruritus, rash, hives, warts, acne, eczema or change in skin lesion(s). Neuro: No weakness, tremor, incoordination, spasms, paresthesia or pain. Psychiatric: Denies confusion, memory loss or sensory loss. Endo: Denies change in weight, skin or hair change.  Heme/Lymph: No excessive bleeding, bruising or enlarged lymph nodes.   Physical Exam  BP 130/80   Pulse 60   Temp 97.9 F (36.6 C)   Resp 16   Ht 5\' 10"  (1.778 m)   Wt 222 lb 3.2 oz (100.8 kg)   SpO2 99%   BMI 31.88 kg/m   Appears  well nourished, well groomed  and in no distress.  Eyes: PERRLA, EOMs, conjunctiva no swelling or erythema. Sinuses: No frontal/maxillary tenderness ENT/Mouth: EAC's clear, TM's nl w/o erythema, bulging. Nares clear w/o erythema, swelling, exudates. Oropharynx clear without erythema or exudates. Oral hygiene is good. Tongue normal, non obstructing. Hearing intact.  Neck: Supple. Thyroid not palpable. Car 2+/2+ without bruits, nodes or JVD. Chest: Respirations nl with BS clear & equal w/o rales, rhonchi, wheezing or stridor.  Cor: Heart sounds normal w/ regular rate and rhythm without sig. murmurs, gallops, clicks or rubs. Peripheral pulses normal and equal  without edema.  Abdomen: Soft & bowel sounds normal. Non-tender w/o guarding, rebound, hernias,  masses or organomegaly.  Lymphatics: Unremarkable.  Musculoskeletal: Full ROM all peripheral extremities, joint stability, 5/5 strength and normal gait.  Skin: Warm, dry without exposed rashes, lesions or ecchymosis apparent.  Neuro: Cranial nerves intact, reflexes equal bilaterally. Sensory-motor testing grossly intact. Tendon reflexes grossly intact.  Pysch: Alert & oriented x 3.  Insight and judgement nl & appropriate. No ideations.  Assessment and Plan:   1. Essential hypertension   -  Continue medication, monitor blood pressure at home.  - Continue DASH diet.  Reminder to go to the ER if any CP,  SOB, nausea, dizziness, severe HA, changes vision/speech.    - CBC with Differential/Platelet - COMPLETE METABOLIC PANEL WITH GFR - Magnesium - TSH   2. Hyperlipidemia, mixed   - Continue diet/meds, exercise,& lifestyle modifications.  - Continue monitor periodic cholesterol/liver & renal functions     - Lipid panel - TSH   3. Abnormal glucose  - Continue diet, exercise  - Lifestyle modifications.  - Monitor appropriate labs   - Hemoglobin A1c   4. Vitamin D deficiency   - Continue supplementation    - VITAMIN D 25 Hydroxy    5. Stage 3b chronic kidney disease (HCC)  - COMPLETE METABOLIC PANEL WITH GFR   6. Medication management - CBC with Differential/Platelet - COMPLETE METABOLIC PANEL WITH GFR - Magnesium - Lipid panel - TSH - Hemoglobin A1c - VITAMIN D 25 Hydroxy           Discussed  regular exercise, BP monitoring, weight control to achieve/maintain BMI less than 25 and discussed med and SE's. Recommended labs to assess and monitor clinical status with further disposition pending results of labs.  I discussed the assessment and treatment plan with the patient. The patient was provided an opportunity to ask questions and all were answered. The patient agreed with the plan and demonstrated an understanding of the instructions.  I provided over 30 minutes  of exam, counseling, chart review and  complex critical decision making.        The patient was advised to call back or seek an in-person evaluation if the symptoms worsen or if the condition fails to improve as anticipated.   Marinus Maw, MD

## 2022-10-17 NOTE — Patient Instructions (Signed)

## 2022-10-18 LAB — CBC WITH DIFFERENTIAL/PLATELET
Absolute Monocytes: 524 {cells}/uL (ref 200–950)
Basophils Absolute: 69 {cells}/uL (ref 0–200)
Basophils Relative: 0.9 %
Eosinophils Absolute: 270 {cells}/uL (ref 15–500)
Eosinophils Relative: 3.5 %
HCT: 53 % — ABNORMAL HIGH (ref 38.5–50.0)
Hemoglobin: 17.8 g/dL — ABNORMAL HIGH (ref 13.2–17.1)
Lymphs Abs: 2187 {cells}/uL (ref 850–3900)
MCH: 29.7 pg (ref 27.0–33.0)
MCHC: 33.6 g/dL (ref 32.0–36.0)
MCV: 88.3 fL (ref 80.0–100.0)
MPV: 8.9 fL (ref 7.5–12.5)
Monocytes Relative: 6.8 %
Neutro Abs: 4651 {cells}/uL (ref 1500–7800)
Neutrophils Relative %: 60.4 %
Platelets: 290 10*3/uL (ref 140–400)
RBC: 6 10*6/uL — ABNORMAL HIGH (ref 4.20–5.80)
RDW: 13 % (ref 11.0–15.0)
Total Lymphocyte: 28.4 %
WBC: 7.7 10*3/uL (ref 3.8–10.8)

## 2022-10-18 LAB — HEMOGLOBIN A1C
Hgb A1c MFr Bld: 5.7 %{Hb} — ABNORMAL HIGH (ref <5.7)
Mean Plasma Glucose: 117 mg/dL
eAG (mmol/L): 6.5 mmol/L

## 2022-10-18 LAB — COMPLETE METABOLIC PANEL WITH GFR
AG Ratio: 1.7 (calc) (ref 1.0–2.5)
ALT: 18 U/L (ref 9–46)
AST: 15 U/L (ref 10–35)
Albumin: 4.6 g/dL (ref 3.6–5.1)
Alkaline phosphatase (APISO): 59 U/L (ref 35–144)
BUN/Creatinine Ratio: 13 (calc) (ref 6–22)
BUN: 21 mg/dL (ref 7–25)
CO2: 29 mmol/L (ref 20–32)
Calcium: 9.8 mg/dL (ref 8.6–10.3)
Chloride: 102 mmol/L (ref 98–110)
Creat: 1.65 mg/dL — ABNORMAL HIGH (ref 0.70–1.28)
Globulin: 2.7 g/dL (ref 1.9–3.7)
Glucose, Bld: 108 mg/dL — ABNORMAL HIGH (ref 65–99)
Potassium: 5.3 mmol/L (ref 3.5–5.3)
Sodium: 141 mmol/L (ref 135–146)
Total Bilirubin: 0.5 mg/dL (ref 0.2–1.2)
Total Protein: 7.3 g/dL (ref 6.1–8.1)
eGFR: 43 mL/min/{1.73_m2} — ABNORMAL LOW (ref >=60)

## 2022-10-18 LAB — LIPID PANEL
Cholesterol: 130 mg/dL (ref <200)
HDL: 37 mg/dL — ABNORMAL LOW (ref >=40)
LDL Cholesterol (Calc): 69 mg/dL
Non-HDL Cholesterol (Calc): 93 mg/dL (ref <130)
Total CHOL/HDL Ratio: 3.5 (calc) (ref <5.0)
Triglycerides: 159 mg/dL — ABNORMAL HIGH (ref <150)

## 2022-10-18 LAB — MAGNESIUM: Magnesium: 2.2 mg/dL (ref 1.5–2.5)

## 2022-10-18 LAB — TSH: TSH: 2.11 m[IU]/L (ref 0.40–4.50)

## 2022-10-18 LAB — INSULIN, RANDOM: Insulin: 154.7 u[IU]/mL — ABNORMAL HIGH

## 2022-10-18 LAB — VITAMIN D 25 HYDROXY (VIT D DEFICIENCY, FRACTURES): Vit D, 25-Hydroxy: 90 ng/mL (ref 30–100)

## 2022-10-18 NOTE — Progress Notes (Signed)
<>*<>*<>*<>*<>*<>*<>*<>*<>*<>*<>*<>*<>*<>*<>*<>*<>*<>*<>*<>*<>*<>*<>*<>*<> <>*<>*<>*<>*<>*<>*<>*<>*<>*<>*<>*<>*<>*<>*<>*<>*<>*<>*<>*<>*<>*<>*<>*<>*<>  -  Test results slightly outside the reference range are not unusual. If there is anything important, I will review this with you,  otherwise it is considered normal test values.  If you have further questions,  please do not hesitate to contact me at the office or via My Chart.   <>*<>*<>*<>*<>*<>*<>*<>*<>*<>*<>*<>*<>*<>*<>*<>*<>*<>*<>*<>*<>*<>*<>*<>*<> <>*<>*<>*<>*<>*<>*<>*<>*<>*<>*<>*<>*<>*<>*<>*<>*<>*<>*<>*<>*<>*<>*<>*<>*<>  -   Kidney functions remain Stable  - Stage 3b   -  Very important to drink adequate amounts of fluids to prevent permanent damage    - Recommend drink at least 6 bottles (16 ounces) of fluids /water /day = 96 Oz ~100 oz  - 100 oz = 3,000 cc or 3 liters / day  - >> That's 1 &1/2 bottles of a 2 liter soda bottle /day !   <>*<>*<>*<>*<>*<>*<>*<>*<>*<>*<>*<>*<>*<>*<>*<>*<>*<>*<>*<>*<>*<>*<>*<>*<> <>*<>*<>*<>*<>*<>*<>*<>*<>*<>*<>*<>*<>*<>*<>*<>*<>*<>*<>*<>*<>*<>*<>*<>*<>  -   Chol = 130   &   LDL = 69   - Both     Excellent   - Very low risk for Heart Attack  / Stroke  <>*<>*<>*<>*<>*<>*<>*<>*<>*<>*<>*<>*<>*<>*<>*<>*<>*<>*<>*<>*<>*<>*<>*<>*<> <>*<>*<>*<>*<>*<>*<>*<>*<>*<>*<>*<>*<>*<>*<>*<>*<>*<>*<>*<>*<>*<>*<>*<>*<>  -   A1c = 5.7% is borderline elevated 12 week average blood sugar, so    - Avoid Sweets, Candy & White Stuff   - White Rice, White [place], White Flour  - Breads &  Pasta  <>*<>*<>*<>*<>*<>*<>*<>*<>*<>*<>*<>*<>*<>*<>*<>*<>*<>*<>*<>*<>*<>*<>*<>*<> <>*<>*<>*<>*<>*<>*<>*<>*<>*<>*<>*<>*<>*<>*<>*<>*<>*<>*<>*<>*<>*<>*<>*<>*<>  -   Insulin = 154.7 shows insulin resistance )( Normal is less than 20 ! )  - a sign of early diabetes and   associated with a 300 % greater risk for   heart attacks, strokes, cancer & Alzheimer type vascular dementia   - All this can be cured  and prevented with losing  weight   - get Dr Francis Dowse Fuhrman's book 'the End of Diabetes" and   "the End of Dieting"   <>*<>*<>*<>*<>*<>*<>*<>*<>*<>*<>*<>*<>*<>*<>*<>*<>*<>*<>*<>*<>*<>*<>*<>*<> <>*<>*<>*<>*<>*<>*<>*<>*<>*<>*<>*<>*<>*<>*<>*<>*<>*<>*<>*<>*<>*<>*<>*<>*<>  -   Vitamin D = 90 - Excellent - Please keep dosage same  <>*<>*<>*<>*<>*<>*<>*<>*<>*<>*<>*<>*<>*<>*<>*<>*<>*<>*<>*<>*<>*<>*<>*<>*<> <>*<>*<>*<>*<>*<>*<>*<>*<>*<>*<>*<>*<>*<>*<>*<>*<>*<>*<>*<>*<>*<>*<>*<>*<>  -   All Else - CBC - Kidneys - Electrolytes - Liver - Magnesium & Thyroid    - all  Normal / OK  <>*<>*<>*<>*<>*<>*<>*<>*<>*<>*<>*<>*<>*<>*<>*<>*<>*<>*<>*<>*<>*<>*<>*<>*<> <>*<>*<>*<>*<>*<>*<>*<>*<>*<>*<>*<>*<>*<>*<>*<>*<>*<>*<>*<>*<>*<>*<>*<>*<>

## 2022-12-19 ENCOUNTER — Other Ambulatory Visit: Payer: Self-pay

## 2022-12-19 ENCOUNTER — Encounter: Payer: Self-pay | Admitting: Nurse Practitioner

## 2022-12-19 ENCOUNTER — Ambulatory Visit (INDEPENDENT_AMBULATORY_CARE_PROVIDER_SITE_OTHER): Payer: Medicare Other | Admitting: Nurse Practitioner

## 2022-12-19 VITALS — BP 150/72 | HR 60 | Temp 97.9°F | Ht 70.0 in | Wt 223.6 lb

## 2022-12-19 DIAGNOSIS — J069 Acute upper respiratory infection, unspecified: Secondary | ICD-10-CM | POA: Diagnosis not present

## 2022-12-19 DIAGNOSIS — I10 Essential (primary) hypertension: Secondary | ICD-10-CM | POA: Diagnosis not present

## 2022-12-19 MED ORDER — DEXAMETHASONE 4 MG PO TABS
ORAL_TABLET | ORAL | 0 refills | Status: DC
Start: 2022-12-19 — End: 2023-01-19

## 2022-12-19 NOTE — Progress Notes (Signed)
Assessment and Plan:  Jeffrey Carey was seen today for acute visit.  Diagnoses and all orders for this visit:  Essential hypertension - continue medications: Atenolol 100 mg every day  - Continue DASH diet, exercise and monitor at home. Call if greater than 130/80.    Viral URI Push fluids Mucinex to thin mucus secretions Dexamethasone taper as directed If no improvement in the next 4 days notify the office -     dexamethasone (DECADRON) 4 MG tablet; Take 3 tabs for 3 days, 2 tabs for 3 days 1 tab for 5 days. Take with food.       Further disposition pending results of labs. Discussed med's effects and SE's.   Over 30 minutes of exam, counseling, chart review, and critical decision making was performed.   Future Appointments  Date Time Provider Department Center  12/19/2022  2:45 PM Raynelle Dick, NP [facility] None  01/19/2023  3:30 PM Raynelle Dick, NP [facility] None  04/19/2023 10:00 AM Lucky Cowboy, MD [facility] None  07/25/2023  3:30 PM Raynelle Dick, NP [facility] None    ------------------------------------------------------------------------------------------------------------------   HPI BP (!) 150/72   Pulse 60   Temp 97.9 F (36.6 C)   Ht 5\' 10"  (1.778 m)   Wt 223 lb 9.6 oz (101.4 kg)   SpO2 95%   BMI 32.08 kg/m   77 y.o.male presents for complaints of deep nonproductive cough with some wheezing x 3 days.  Denies production of mucus.  Has had a little sore throat and runny nose of clear mucus Denies nausea, vomiting, diarrhea, fever, nasal congestion and body aches. He has been using Tylenol, Robitussin and Nyquil with no relief.   BP well controlled with Atenolol 100 mg every day  BP Readings from Last 3 Encounters:  12/19/22 (!) 150/72  10/17/22 130/80  07/21/22 135/79  Denies headaches, chest pain, shortness of breath and dizziness   BMI is Body mass index is 32.08 kg/m., he has not been working on diet and exercise. Wt Readings from  Last 3 Encounters:  12/19/22 223 lb 9.6 oz (101.4 kg)  10/17/22 222 lb 3.2 oz (100.8 kg)  07/21/22 220 lb (99.8 kg)     Past Medical History:  Diagnosis Date   Chronic kidney disease    Dyslipidemia    ED (erectile dysfunction)    GERD (gastroesophageal reflux disease)    History of kidney stones    Hyperlipidemia    Hypertension    Radiculopathy 04/30/2014   Vitamin D deficiency      Allergies  Allergen Reactions   Ceftin [Cefuroxime Axetil] Hives   Peanut-Containing Drug Products Other (See Comments)    Break out on nose     Current Outpatient Medications on File Prior to Visit  Medication Sig   Acetaminophen (TYLENOL PO) Take by mouth. PRN   aspirin 81 MG tablet Take 81 mg by mouth at bedtime.    atenolol (TENORMIN) 100 MG tablet TAKE 1 TABLET BY MOUTH EVERY DAY FOR BLOOD PRESSURE   CINNAMON PO Take 1,000 mg by mouth daily.   Flaxseed, Linseed, (FLAXSEED OIL PO) Take 1 capsule by mouth daily.   Fluticasone Propionate (FLONASE NA) Place into the nose.   Magnesium 250 MG TABS Take by mouth.   Omega-3 Fatty Acids (FISH OIL PO) Take 1,200 mg by mouth at bedtime.    pravastatin (PRAVACHOL) 40 MG tablet TAKE 1 TABLET BY MOUTH EVERY DAY AT BEDTIME FOR CHOLESTEROL   Pseudoeph-Doxylamine-DM-APAP (NYQUIL PO) Take by mouth.  Pseudoephedrine-DM-GG (ROBITUSSIN COLD & COUGH PO) Take by mouth.   VITAMIN D PO Take 5,000 Units by mouth daily.   traZODone (DESYREL) 150 MG tablet Take  1/2 to 1 tablet    1 to 2 hours   before Bedtime  as needed for Sleep (Patient not taking: Reported on 12/19/2022)   No current facility-administered medications on file prior to visit.    ROS: all negative except above.   Physical Exam:  BP (!) 150/72   Pulse 60   Temp 97.9 F (36.6 C)   Ht 5\' 10"  (1.778 m)   Wt 223 lb 9.6 oz (101.4 kg)   SpO2 95%   BMI 32.08 kg/m   General Appearance: Well nourished, in no apparent distress. Eyes: PERRLA, EOMs, conjunctiva no swelling or  erythema Sinuses: No Frontal/maxillary tenderness ENT/Mouth: Ext aud canals clear, TMs without erythema, bulging. No erythema, swelling, or exudate on post pharynx.  Hearing normal.  Neck: Supple, thyroid normal.  Respiratory: Respiratory effort normal, BS equal bilaterally without rales, rhonchi, wheezing or stridor.  Cardio: RRR with no MRGs. Brisk peripheral pulses without edema.  Abdomen: Soft, + BS.   Lymphatics: Non tender without lymphadenopathy.  Musculoskeletal: Full ROM, 5/5 strength, normal gait.  Skin: Warm, dry without rashes, lesions, ecchymosis.  Neuro: Cranial nerves intact. Normal muscle tone, no cerebellar symptoms. Sensation intact.  Psych: Awake and oriented X 3, normal affect, Insight and Judgment appropriate.     Raynelle Dick, NP 2:34 PM [facility]

## 2022-12-19 NOTE — Patient Instructions (Signed)
Mucinex daily as needed Push fluids Dexamethasone taper as directed. If no improvement in the next 4 days notify the office  Upper Respiratory Infection, Adult An upper respiratory infection (URI) affects the nose, throat, and upper airways that lead to the lungs. The most common type of URI is often called the common cold. URIs usually get better on their own, without medical treatment. What are the causes? A URI is caused by a germ (virus). You may catch these germs by: Breathing in droplets from an infected person's cough or sneeze. Touching something that has the germ on it (is contaminated) and then touching your mouth, nose, or eyes. What increases the risk? You are more likely to get a URI if: You are very young or very old. You have close contact with others, such as at work, school, or a health care facility. You smoke. You have long-term (chronic) heart or lung disease. You have a weakened disease-fighting system (immune system). You have nasal allergies or asthma. You have a lot of stress. You have poor nutrition. What are the signs or symptoms? Runny or stuffy (congested) nose. Cough. Sneezing. Sore throat. Headache. Feeling tired (fatigue). Fever. Not wanting to eat as much as usual. Pain in your forehead, behind your eyes, and over your cheekbones (sinus pain). Muscle aches. Redness or irritation of the eyes. Pressure in the ears or face. How is this treated? URIs usually get better on their own within 7-10 days. Medicines cannot cure URIs, but your doctor may recommend certain medicines to help relieve symptoms, such as: Over-the-counter cold medicines. Medicines to reduce coughing (cough suppressants). Coughing is a type of defense against infection that helps to clear the nose, throat, windpipe, and lungs (respiratory system). Take these medicines only as told by your doctor. Medicines to lower your fever. Follow these instructions at home: Activity Rest as  needed. If you have a fever, stay home from work or school until your fever is gone, or until your doctor says you may return to work or school. You should stay home until you cannot spread the infection anymore (you are not contagious). Your doctor may have you wear a face mask so you have less risk of spreading the infection. Relieving symptoms Rinse your mouth often with salt water. To make salt water, dissolve -1 tsp (3-6 g) of salt in 1 cup (237 mL) of warm water. Use a cool-mist humidifier to add moisture to the air. This can help you breathe more easily. Eating and drinking  Drink enough fluid to keep your pee (urine) pale yellow. Eat soups and other clear broths. General instructions  Take over-the-counter and prescription medicines only as told by your doctor. Do not smoke or use any products that contain nicotine or tobacco. If you need help quitting, ask your doctor. Avoid being where people are smoking (avoid secondhand smoke). Stay up to date on all your shots (immunizations), and get the flu shot every year. Keep all follow-up visits. How to prevent the spread of infection to others  Wash your hands with soap and water for at least 20 seconds. If you cannot use soap and water, use hand sanitizer. Avoid touching your mouth, face, eyes, or nose. Cough or sneeze into a tissue or your sleeve or elbow. Do not cough or sneeze into your hand or into the air. Contact a doctor if: You are getting worse, not better. You have any of these: A fever or chills. Brown or red mucus in your nose. Yellow or  brown fluid (discharge)coming from your nose. Pain in your face, especially when you bend forward. Swollen neck glands. Pain when you swallow. White areas in the back of your throat. Get help right away if: You have shortness of breath that gets worse. You have very bad or constant: Headache. Ear pain. Pain in your forehead, behind your eyes, and over your cheekbones (sinus  pain). Chest pain. You have long-lasting (chronic) lung disease along with any of these: Making high-pitched whistling sounds when you breathe, most often when you breathe out (wheezing). Long-lasting cough (more than 14 days). Coughing up blood. A change in your usual mucus. You have a stiff neck. You have changes in your: Vision. Hearing. Thinking. Mood. These symptoms may be an emergency. Get help right away. Call 911. Do not wait to see if the symptoms will go away. Do not drive yourself to the hospital. Summary An upper respiratory infection (URI) is caused by a germ (virus). The most common type of URI is often called the common cold. URIs usually get better within 7-10 days. Take over-the-counter and prescription medicines only as told by your doctor. This information is not intended to replace advice given to you by your health care provider. Make sure you discuss any questions you have with your health care provider. Document Revised: 07/22/2020 Document Reviewed: 07/22/2020 Elsevier Patient Education  2024 [employer].

## 2022-12-30 ENCOUNTER — Encounter: Payer: Self-pay | Admitting: Internal Medicine

## 2023-01-01 ENCOUNTER — Other Ambulatory Visit: Payer: Self-pay | Admitting: Nurse Practitioner

## 2023-01-01 DIAGNOSIS — J069 Acute upper respiratory infection, unspecified: Secondary | ICD-10-CM

## 2023-01-01 MED ORDER — AZITHROMYCIN 250 MG PO TABS
ORAL_TABLET | ORAL | 1 refills | Status: DC
Start: 2023-01-01 — End: 2023-01-19

## 2023-01-01 MED ORDER — PROMETHAZINE-DM 6.25-15 MG/5ML PO SYRP
5.0000 mL | ORAL_SOLUTION | Freq: Four times a day (QID) | ORAL | 1 refills | Status: DC | PRN
Start: 2023-01-01 — End: 2023-03-17

## 2023-01-16 ENCOUNTER — Ambulatory Visit: Payer: Medicare Other | Admitting: Nurse Practitioner

## 2023-01-16 DIAGNOSIS — N1832 Chronic kidney disease, stage 3b: Secondary | ICD-10-CM | POA: Diagnosis not present

## 2023-01-18 NOTE — Progress Notes (Signed)
3 MONTH FOLLOW UP Assessment:    Essential hypertension Monitor blood pressure at home; call if consistently over 130/80 Continue DASH diet.   Reminder to go to the ER if any CP, SOB, nausea, dizziness, severe HA, changes vision/speech, left arm numbness and tingling and jaw pain.  Gastroesophageal reflux disease, esophagitis presence not specified Well managed on current medications, PPI PRN <2/week Discussed diet, avoiding triggers and other lifestyle changes  Vitamin D deficiency At goal at recent check; continue to recommend supplementation for goal of 70-100 Defer vitamin D level  Other abnormal glucose Recent A1Cs at goal Discussed diet/exercise, weight management  Check CMP  Obesity (BMI 30.0-34.9) Long discussion about weight loss, diet, and exercise Recommended diet heavy in fruits and veggies and low in animal meats, cheeses, and dairy products, appropriate calorie intake Patient will work on cutting sugar, watching portions, increase walking Discussed appropriate weight for height and initial goal (<200 lb) Follow up at next visit  Hyperlipidemia Continue medications: pravastatin 40 mg daily Continue low cholesterol diet and exercise.  Check lipid panel.   CKD (chronic kidney disease) stage 3b, GFR 30-44 ml/min (HCC) Increase fluids, avoid NSAIDS, monitor sugars, has been stable, will monitor CMP each visit Nephrology following annually   Medication management -     CBC with Differential/Platelet -     COMPLETE METABOLIC PANEL WITH GFR -     Lipid panel -     Magnesium -     Pathologist smear review  Erythrocytosis Has occurred over several years -     CBC with Differential/Platelet -     Pathologist smear review  Post-nasal drainage -     ipratropium (ATROVENT) 0.03 % nasal spray; Place 2 sprays into the nose 3 (three) times daily.  Redness of eye left Occurs once a month and lasts 2-3 days and resolves Take picture and show to eye doctor at  upcoming appointment     Over 30 minutes of exam, counseling, chart review, and critical decision making was performed  Future Appointments  Date Time Provider Department Center  04/19/2023 10:00 AM Lucky Cowboy, MD [facility] None  07/25/2023  3:30 PM Raynelle Dick, NP [facility] None    Subjective:  Jeffrey Carey is a 78 y.o. male who presents for 3 month follow up for HTN, hyperlipidemia, glucose management, and vitamin D Def.   he has a diagnosis of GERD which is currently managed by omeprazole 20 mg PRN, <2/week he reports symptoms is currently well controlled, and denies breakthrough reflux, burning in chest, hoarseness or cough.    He has redness of left eye that occurs once a month and will last 2-3 days and then resolve. Denies itching/irritation. He will have more tears from the eye when it occurs.   Continues to have a lot of post nasal drip which is worse when he lays down to sleep at night.   BMI is Body mass index is 31.77 kg/m., he has been working on diet and exercise, admits has been snacking too much, working in yard, [employer] several, plans to restart walking with he wife. He does weigh himself regularly.  Wt Readings from Last 3 Encounters:  01/19/23 221 lb 6.4 oz (100.4 kg)  12/19/22 223 lb 9.6 oz (101.4 kg)  10/17/22 222 lb 3.2 oz (100.8 kg)   His blood pressure has been controlled at home (130s/low 80s) with Atenolol 100 mg every day , today their BP is BP: 128/74 BP Readings from Last 3 Encounters:  01/19/23 128/74  12/19/22 (!) 150/72  10/17/22 130/80  He does not workout. He denies chest pain, shortness of breath, dizziness.   He is on cholesterol medication (pravastatin 40 mg daily) and denies myalgias. His cholesterol is at goal. The cholesterol last visit was:   Lab Results  Component Value Date   CHOL 130 10/17/2022   HDL 37 (L) 10/17/2022   LDLCALC 69 10/17/2022   TRIG 159 (H) 10/17/2022   CHOLHDL 3.5 10/17/2022   He has been working on  diet and exercise for history of prediabetes (last elevation 5.7% in 2017), and denies increased appetite, nausea, paresthesia of the feet, polydipsia, polyuria, visual disturbances, vomiting and weight loss. Last A1C in the office was:  Lab Results  Component Value Date   HGBA1C 5.7 (H) 10/17/2022   He has CKD IIIb without albuminuria monitored at nephrology, Dr. Valentino Nose recently released to annual follow up, getting 3-4 bottles of water in daily:  Lab Results  Component Value Date   GFRNONAA 40 (L) 05/19/2020   GFRNONAA 42 (L) 05/04/2020   GFRNONAA 36 (L) 02/03/2020    Patient is on Vitamin D supplement.   Lab Results  Component Value Date   VD25OH 90 10/17/2022     He has been running elevated RBC and hemoglobin/hematocrit Lab Results  Component Value Date   WBC 7.7 10/17/2022   HGB 17.8 (H) 10/17/2022   HCT 53.0 (H) 10/17/2022   MCV 88.3 10/17/2022   PLT 290 10/17/2022  RBC: 6.0  Medication Review:   Current Outpatient Medications (Cardiovascular):    atenolol (TENORMIN) 100 MG tablet, TAKE 1 TABLET BY MOUTH EVERY DAY FOR BLOOD PRESSURE   pravastatin (PRAVACHOL) 40 MG tablet, TAKE 1 TABLET BY MOUTH EVERY DAY AT BEDTIME FOR CHOLESTEROL  Current Outpatient Medications (Respiratory):    Fluticasone Propionate (FLONASE NA), Place into the nose.   promethazine-dextromethorphan (PROMETHAZINE-DM) 6.25-15 MG/5ML syrup, Take 5 mLs by mouth 4 (four) times daily as needed for cough.   Pseudoeph-Doxylamine-DM-APAP (NYQUIL PO), Take by mouth. (Patient not taking: Reported on 01/19/2023)   Pseudoephedrine-DM-GG (ROBITUSSIN COLD & COUGH PO), Take by mouth. (Patient not taking: Reported on 01/19/2023)  Current Outpatient Medications (Analgesics):    Acetaminophen (TYLENOL PO), Take by mouth. PRN   aspirin 81 MG tablet, Take 81 mg by mouth at bedtime.    Current Outpatient Medications (Other):    CINNAMON PO, Take 1,000 mg by mouth daily.   Flaxseed, Linseed, (FLAXSEED OIL PO), Take 1  capsule by mouth daily.   Magnesium 250 MG TABS, Take by mouth.   Omega-3 Fatty Acids (FISH OIL PO), Take 1,200 mg by mouth at bedtime.    VITAMIN D PO, Take 5,000 Units by mouth daily.  Allergies: Allergies  Allergen Reactions   Ceftin [Cefuroxime Axetil] Hives   Peanut-Containing Drug Products Other (See Comments)    Break out on nose     Current Problems (verified) has History of colonic polyps; Essential hypertension; Hyperlipidemia, mixed; Abnormal glucose; Vitamin D deficiency; Gastroesophageal reflux disease; Obesity (BMI 30.0-34.9); CKD (chronic kidney disease) stage 3, GFR 30-59 ml/min (HCC); History of prediabetes; and ED (erectile dysfunction) on their problem list.  Surgical: He  has a past surgical history that includes Laparoscopic cholecystectomy (10/2010); Hammer toe surgery (2012); Anterior cervical decomp/discectomy fusion (N/A, 04/30/2014); Colonoscopy; and Upper gastrointestinal endoscopy. Family His family history includes Alzheimer's disease in his father; Colon cancer (age of onset: 19) in his father and mother; Diabetes in his father; Ovarian cancer in his mother;  Prostate cancer in his father. Social history  He reports that he has never smoked. He has never used smokeless tobacco. He reports that he does not drink alcohol and does not use drugs.  Review of Systems  Constitutional:  Negative for malaise/fatigue and weight loss.  HENT:  Negative for hearing loss and tinnitus.        Post nasal drip  Eyes:  Positive for redness (left eye, no pain or itching). Negative for blurred vision and double vision.  Respiratory:  Negative for cough, shortness of breath and wheezing.   Cardiovascular:  Negative for chest pain, palpitations, orthopnea, claudication and leg swelling.  Gastrointestinal:  Negative for abdominal pain, blood in stool, constipation, diarrhea, heartburn, melena, nausea and vomiting.  Genitourinary: Negative.   Musculoskeletal:  Negative for joint  pain and myalgias.  Skin:  Negative for rash.  Neurological:  Negative for dizziness, tingling, sensory change, weakness and headaches.  Endo/Heme/Allergies:  Negative for polydipsia.  Psychiatric/Behavioral: Negative.    All other systems reviewed and are negative.    Objective:   Today's Vitals   01/19/23 1525  BP: 128/74  Pulse: (!) 58  Temp: 98 F (36.7 C)  SpO2: 99%  Weight: 221 lb 6.4 oz (100.4 kg)  Height: 5\' 10"  (1.778 m)     Body mass index is 31.77 kg/m.  General appearance: alert, no distress, WD/WN, male HEENT: normocephalic, sclerae erythematous on left side TMs pearly, nares patent, no discharge or erythema, pharynx normal Oral cavity: MMM, no lesions Neck: supple, no lymphadenopathy, no thyromegaly, no masses Heart: RRR, normal S1, S2, no murmurs Lungs: CTA bilaterally, no wheezes, rhonchi, or rales Abdomen: +bs, soft, non tender, non distended, no masses, no hepatomegaly, no splenomegaly Musculoskeletal: nontender, no swelling, no obvious deformity.  Extremities: no edema, no cyanosis, no clubbing Pulses: 2+ symmetric, upper and lower extremities, normal cap refill Neurological: alert, oriented x 3, CN2-12 intact, strength normal upper extremities and lower extremities, sensation normal throughout, DTRs 2+ throughout, no cerebellar signs, gait normal Psychiatric: normal affect, behavior normal, pleasant    Raynelle Dick, NP   01/19/2023

## 2023-01-19 ENCOUNTER — Ambulatory Visit (INDEPENDENT_AMBULATORY_CARE_PROVIDER_SITE_OTHER): Payer: Medicare Other | Admitting: Nurse Practitioner

## 2023-01-19 ENCOUNTER — Encounter: Payer: Self-pay | Admitting: Nurse Practitioner

## 2023-01-19 VITALS — BP 128/74 | HR 58 | Temp 98.0°F | Ht 70.0 in | Wt 221.4 lb

## 2023-01-19 DIAGNOSIS — I10 Essential (primary) hypertension: Secondary | ICD-10-CM | POA: Diagnosis not present

## 2023-01-19 DIAGNOSIS — R0982 Postnasal drip: Secondary | ICD-10-CM | POA: Diagnosis not present

## 2023-01-19 DIAGNOSIS — D751 Secondary polycythemia: Secondary | ICD-10-CM | POA: Diagnosis not present

## 2023-01-19 DIAGNOSIS — R7309 Other abnormal glucose: Secondary | ICD-10-CM | POA: Diagnosis not present

## 2023-01-19 DIAGNOSIS — H5789 Other specified disorders of eye and adnexa: Secondary | ICD-10-CM

## 2023-01-19 DIAGNOSIS — E559 Vitamin D deficiency, unspecified: Secondary | ICD-10-CM

## 2023-01-19 DIAGNOSIS — Z79899 Other long term (current) drug therapy: Secondary | ICD-10-CM | POA: Diagnosis not present

## 2023-01-19 DIAGNOSIS — E782 Mixed hyperlipidemia: Secondary | ICD-10-CM

## 2023-01-19 DIAGNOSIS — E66811 Obesity, class 1: Secondary | ICD-10-CM

## 2023-01-19 DIAGNOSIS — N1832 Chronic kidney disease, stage 3b: Secondary | ICD-10-CM

## 2023-01-19 MED ORDER — IPRATROPIUM BROMIDE 0.03 % NA SOLN: 2.0000 | Freq: Three times a day (TID) | NASAL | 2 refills | Status: DC

## 2023-01-19 NOTE — Addendum Note (Signed)
Addended by: Anda Kraft E on: 01/19/2023 03:58 PM   Modules accepted: Orders

## 2023-01-19 NOTE — Patient Instructions (Signed)

## 2023-01-20 LAB — COMPLETE METABOLIC PANEL WITH GFR
AG Ratio: 1.7 (calc) (ref 1.0–2.5)
ALT: 26 U/L (ref 9–46)
AST: 20 U/L (ref 10–35)
Albumin: 4.1 g/dL (ref 3.6–5.1)
Alkaline phosphatase (APISO): 59 U/L (ref 35–144)
BUN/Creatinine Ratio: 13 (calc) (ref 6–22)
BUN: 21 mg/dL (ref 7–25)
CO2: 29 mmol/L (ref 20–32)
Calcium: 9.4 mg/dL (ref 8.6–10.3)
Chloride: 102 mmol/L (ref 98–110)
Creat: 1.58 mg/dL — ABNORMAL HIGH (ref 0.70–1.28)
Globulin: 2.4 g/dL (ref 1.9–3.7)
Glucose, Bld: 80 mg/dL (ref 65–99)
Potassium: 5.3 mmol/L (ref 3.5–5.3)
Sodium: 139 mmol/L (ref 135–146)
Total Bilirubin: 0.4 mg/dL (ref 0.2–1.2)
Total Protein: 6.5 g/dL (ref 6.1–8.1)
eGFR: 45 mL/min/{1.73_m2} — ABNORMAL LOW (ref >=60)

## 2023-01-20 LAB — CBC WITH DIFFERENTIAL/PLATELET
Absolute Lymphocytes: 1842 {cells}/uL (ref 850–3900)
Absolute Monocytes: 711 {cells}/uL (ref 200–950)
Basophils Absolute: 48 {cells}/uL (ref 0–200)
Basophils Relative: 0.7 %
Eosinophils Absolute: 421 {cells}/uL (ref 15–500)
Eosinophils Relative: 6.1 %
HCT: 48 % (ref 38.5–50.0)
Hemoglobin: 16.1 g/dL (ref 13.2–17.1)
MCH: 29.8 pg (ref 27.0–33.0)
MCHC: 33.5 g/dL (ref 32.0–36.0)
MCV: 88.7 fL (ref 80.0–100.0)
MPV: 8.7 fL (ref 7.5–12.5)
Monocytes Relative: 10.3 %
Neutro Abs: 3878 {cells}/uL (ref 1500–7800)
Neutrophils Relative %: 56.2 %
Platelets: 320 10*3/uL (ref 140–400)
RBC: 5.41 10*6/uL (ref 4.20–5.80)
RDW: 13.1 % (ref 11.0–15.0)
Total Lymphocyte: 26.7 %
WBC: 6.9 10*3/uL (ref 3.8–10.8)

## 2023-01-20 LAB — LIPID PANEL
Cholesterol: 121 mg/dL (ref <200)
HDL: 40 mg/dL (ref >=40)
LDL Cholesterol (Calc): 61 mg/dL
Non-HDL Cholesterol (Calc): 81 mg/dL (ref <130)
Total CHOL/HDL Ratio: 3 (calc) (ref <5.0)
Triglycerides: 114 mg/dL (ref <150)

## 2023-01-20 LAB — MAGNESIUM: Magnesium: 2.1 mg/dL (ref 1.5–2.5)

## 2023-01-23 DIAGNOSIS — E785 Hyperlipidemia, unspecified: Secondary | ICD-10-CM | POA: Diagnosis not present

## 2023-01-23 DIAGNOSIS — I129 Hypertensive chronic kidney disease with stage 1 through stage 4 chronic kidney disease, or unspecified chronic kidney disease: Secondary | ICD-10-CM | POA: Diagnosis not present

## 2023-01-23 DIAGNOSIS — N1832 Chronic kidney disease, stage 3b: Secondary | ICD-10-CM | POA: Diagnosis not present

## 2023-01-23 LAB — COMPREHENSIVE METABOLIC PANEL
Albumin: 4.3 (ref 3.5–5.0)
Calcium: 9.9 (ref 8.7–10.7)
eGFR: 43

## 2023-01-23 LAB — BASIC METABOLIC PANEL
BUN: 20 (ref 4–21)
CO2: 31 — AB (ref 13–22)
Chloride: 102 (ref 99–108)
Creatinine: 1.6 — AB (ref 0.6–1.3)
Glucose: 108
Potassium: 4.8 meq/L (ref 3.5–5.1)
Sodium: 140 (ref 137–147)

## 2023-01-23 LAB — CBC AND DIFFERENTIAL: Hemoglobin: 17 (ref 13.5–17.5)

## 2023-03-17 ENCOUNTER — Ambulatory Visit (INDEPENDENT_AMBULATORY_CARE_PROVIDER_SITE_OTHER): Payer: Medicare Other | Admitting: Family Medicine

## 2023-03-17 ENCOUNTER — Encounter: Payer: Self-pay | Admitting: Family Medicine

## 2023-03-17 VITALS — BP 138/78 | HR 54 | Temp 97.1°F | Ht 68.11 in | Wt 218.0 lb

## 2023-03-17 DIAGNOSIS — I10 Essential (primary) hypertension: Secondary | ICD-10-CM | POA: Diagnosis not present

## 2023-03-17 DIAGNOSIS — E782 Mixed hyperlipidemia: Secondary | ICD-10-CM

## 2023-03-17 DIAGNOSIS — N1832 Chronic kidney disease, stage 3b: Secondary | ICD-10-CM

## 2023-03-17 NOTE — Progress Notes (Signed)
 Assessment/Plan:      Chronic Kidney Disease Stage 3B   Chronic kidney disease stage 3B with well-managed kidney function. GFR was 45 in January 2025. Lisinopril was discontinued by the nephrologist as it did not impact kidney function. Currently on atenolol for hypertension, which is generally well-controlled at home, though slightly elevated today at 138/70.   - Request notes from Dr. Melanee Spry at [facility] to review his thought process.   - Monitor blood pressure at home and maintain a log.   - Follow up in four months to recheck blood pressure and discuss further management.    Hypertension   Hypertension managed with atenolol 100 mg daily. Home blood pressure readings are generally between 120-130/70, though today's reading was 138/70, slightly above target.   - Monitor blood pressure at home and maintain a log.   - Follow up in four months to recheck blood pressure and discuss further management.    Hyperlipidemia   Hyperlipidemia managed with pravastatin 40 mg daily. January 2025 cholesterol panel showed LDL at 61 and total cholesterol at 121, indicating good control.    General Health Maintenance   Takes magnesium, fish oil, cinnamon, flax seed, and vitamin D as recommended. No vitamin D deficiency. Uses Flonase as needed for seasonal allergies. Due for regular eye screening, next available in September.   - Request cancellation list for earlier eye appointment with Dr. Lucas Mallow.   - Continue current vitamin regimen.        Medications Discontinued During This Encounter  Medication Reason   ipratropium (ATROVENT) 0.03 % nasal spray    promethazine-dextromethorphan (PROMETHAZINE-DM) 6.25-15 MG/5ML syrup Completed Course   Pseudoeph-Doxylamine-DM-APAP (NYQUIL PO) Completed Course   Pseudoephedrine-DM-GG (ROBITUSSIN COLD & COUGH PO) Completed Course    No follow-ups on file.    Subjective:   Encounter date: 03/17/2023  Jeffrey Carey is a 78 y.o. male who has History  of colonic polyps; Essential hypertension; Hyperlipidemia, mixed; Abnormal glucose; Vitamin D deficiency; Gastroesophageal reflux disease; Obesity (BMI 30.0-34.9); CKD (chronic kidney disease) stage 3, GFR 30-59 ml/min (HCC); History of prediabetes; and ED (erectile dysfunction) on their problem list..   He  has a past medical history of Chronic kidney disease, Dyslipidemia, ED (erectile dysfunction), GERD (gastroesophageal reflux disease), History of kidney stones, Hyperlipidemia, Hypertension, Radiculopathy (04/30/2014), and Vitamin D deficiency.Marland Kitchen   He presents with chief complaint of Establish Care .   Discussed the use of AI scribe software for clinical note transcription with the patient, who gave verbal consent to proceed.  History of Present Illness   Jeffrey Carey is a 78 year old male with stage 3B chronic kidney disease who presents to establish care.  He has been managing his health independently since the passing of his wife two years ago. He finds it challenging to maintain a healthy diet while living alone.  He has stage 3B chronic kidney disease and follows up annually with a nephrologist. His last visit was in January 2025, with lab work showing stable kidney function. He avoids NSAIDs and was previously on lisinopril, which was discontinued. He currently takes atenolol 100 mg daily for hypertension, with home blood pressure readings typically between 120-130/70-80 mmHg.  He has a history of hyperlipidemia, managed with pravastatin 40 mg daily. He also takes aspirin 81 mg daily and uses Flonase as needed for seasonal allergies.  He takes several vitamins, including magnesium, fish oil, cinnamon, flax seed, and vitamin D, as recommended by a previous physician. There is no known  history of vitamin D deficiency.  No chest pain, shortness of breath, palpitations, or urinary problems. He has no current eye problems and is awaiting a routine eye screening appointment.          Past Surgical History:  Procedure Laterality Date   ANTERIOR CERVICAL DECOMP/DISCECTOMY FUSION N/A 04/30/2014   Procedure: ANTERIOR CERVICAL DECOMPRESSION/DISCECTOMY FUSION 1 LEVEL;  Surgeon: Estill Bamberg, MD;  Location: [facility] OR;  Service: Orthopedics;  Laterality: N/A;  Anterior cervical decompression fusion, cervical 7-thoracic 1 with instrumentation and allograft   COLONOSCOPY     HAMMER TOE SURGERY  2012   left foot, 3rd toe   LAPAROSCOPIC CHOLECYSTECTOMY  10/2010   UPPER GASTROINTESTINAL ENDOSCOPY      Outpatient Medications Prior to Visit  Medication Sig Dispense Refill   Acetaminophen (TYLENOL PO) Take by mouth. PRN     aspirin 81 MG tablet Take 81 mg by mouth at bedtime.      atenolol (TENORMIN) 100 MG tablet TAKE 1 TABLET BY MOUTH EVERY DAY FOR BLOOD PRESSURE 90 tablet 3   CINNAMON PO Take 1,000 mg by mouth daily.     Flaxseed, Linseed, (FLAXSEED OIL PO) Take 1 capsule by mouth daily.     Fluticasone Propionate (FLONASE NA) Place into the nose.     Magnesium 250 MG TABS Take by mouth.     Omega-3 Fatty Acids (FISH OIL PO) Take 1,200 mg by mouth at bedtime.      pravastatin (PRAVACHOL) 40 MG tablet TAKE 1 TABLET BY MOUTH EVERY DAY AT BEDTIME FOR CHOLESTEROL 90 tablet 3   VITAMIN D PO Take 5,000 Units by mouth daily.     ipratropium (ATROVENT) 0.03 % nasal spray Place 2 sprays into the nose 3 (three) times daily. (Patient not taking: Reported on 03/17/2023) 30 mL 2   promethazine-dextromethorphan (PROMETHAZINE-DM) 6.25-15 MG/5ML syrup Take 5 mLs by mouth 4 (four) times daily as needed for cough. (Patient not taking: Reported on 03/17/2023) 240 mL 1   Pseudoeph-Doxylamine-DM-APAP (NYQUIL PO) Take by mouth. (Patient not taking: Reported on 01/19/2023)     Pseudoephedrine-DM-GG (ROBITUSSIN COLD & COUGH PO) Take by mouth. (Patient not taking: Reported on 01/19/2023)     No facility-administered medications prior to visit.    Family History  Problem Relation Age of Onset    Colon cancer Mother 64   Ovarian cancer Mother    Colon cancer Father 48   Diabetes Father    Alzheimer's disease Father    Prostate cancer Father    Stomach cancer Neg Hx    Colon polyps Neg Hx    Esophageal cancer Neg Hx    Rectal cancer Neg Hx     Social History   Socioeconomic History   Marital status: Married    Spouse name: Not on file   Number of children: 1   Years of education: Not on file   Highest education level: Not on file  Occupational History   Not on file  Tobacco Use   Smoking status: Never   Smokeless tobacco: Never  Vaping Use   Vaping status: Never Used  Substance and Sexual Activity   Alcohol use: No   Drug use: No   Sexual activity: Not on file  Other Topics Concern   Not on file  Social History Narrative   Lives with wife.  Was a Psychologist, educational.     Social Drivers of Corporate investment banker Strain: Not on file  Food Insecurity: Not on file  Transportation Needs: Not on file  Physical Activity: Not on file  Stress: Not on file  Social Connections: Not on file  Intimate Partner Violence: Not on file                                                                                                  Objective:  Physical Exam: BP 138/78 (BP Location: Left Arm, Patient Position: Sitting, Cuff Size: Normal)   Pulse (!) 54   Temp (!) 97.1 F (36.2 C) (Temporal)   Ht 5' 8.11" (1.73 m)   Wt 218 lb (98.9 kg)   SpO2 97%   BMI 33.04 kg/m     Physical Exam Constitutional:      Appearance: Normal appearance.  HENT:     Head: Normocephalic and atraumatic.     Right Ear: Hearing normal.     Left Ear: Hearing normal.     Nose: Nose normal.  Eyes:     General: No scleral icterus.       Right eye: No discharge.        Left eye: No discharge.     Extraocular Movements: Extraocular movements intact.  Cardiovascular:     Rate and Rhythm: Normal rate and regular rhythm.     Heart sounds: Normal heart sounds.  Pulmonary:      Effort: Pulmonary effort is normal.     Breath sounds: Normal breath sounds.  Abdominal:     Palpations: Abdomen is soft.     Tenderness: There is no abdominal tenderness.  Skin:    General: Skin is warm.     Findings: No rash.  Neurological:     General: No focal deficit present.     Mental Status: He is alert.     Cranial Nerves: No cranial nerve deficit.  Psychiatric:        Mood and Affect: Mood normal.        Behavior: Behavior normal.        Thought Content: Thought content normal.        Judgment: Judgment normal.     No results found.  Recent Results (from the past 2160 hours)  CBC with Differential/Platelet     Status: None   Collection Time: 01/19/23  4:01 PM  Result Value Ref Range   WBC 6.9 3.8 - 10.8 Thousand/uL   RBC 5.41 4.20 - 5.80 Million/uL   Hemoglobin 16.1 13.2 - 17.1 g/dL   HCT 13.2 44.0 - 10.2 %   MCV 88.7 80.0 - 100.0 fL   MCH 29.8 27.0 - 33.0 pg   MCHC 33.5 32.0 - 36.0 g/dL    Comment: For adults, a slight decrease in the calculated MCHC value (in the range of 30 to 32 g/dL) is most likely not clinically significant; however, it should be interpreted with caution in correlation with other red cell parameters and the patient's clinical condition.    RDW 13.1 11.0 - 15.0 %   Platelets 320 140 - 400 Thousand/uL   MPV 8.7 7.5 - 12.5 fL   Neutro Abs 3,878 1,500 - 7,800 cells/uL  Absolute Lymphocytes 1,842 850 - 3,900 cells/uL   Absolute Monocytes 711 200 - 950 cells/uL   Eosinophils Absolute 421 15 - 500 cells/uL   Basophils Absolute 48 0 - 200 cells/uL   Neutrophils Relative % 56.2 %   Total Lymphocyte 26.7 %   Monocytes Relative 10.3 %   Eosinophils Relative 6.1 %   Basophils Relative 0.7 %  COMPLETE METABOLIC PANEL WITH GFR     Status: Abnormal   Collection Time: 01/19/23  4:01 PM  Result Value Ref Range   Glucose, Bld 80 65 - 99 mg/dL    Comment: .            Fasting reference interval .    BUN 21 7 - 25 mg/dL   Creat 7.84 (H)  6.96 - 1.28 mg/dL   eGFR 45 (L) > OR = 60 mL/min/1.77m2   BUN/Creatinine Ratio 13 6 - 22 (calc)   Sodium 139 135 - 146 mmol/L   Potassium 5.3 3.5 - 5.3 mmol/L   Chloride 102 98 - 110 mmol/L   CO2 29 20 - 32 mmol/L   Calcium 9.4 8.6 - 10.3 mg/dL   Total Protein 6.5 6.1 - 8.1 g/dL   Albumin 4.1 3.6 - 5.1 g/dL   Globulin 2.4 1.9 - 3.7 g/dL (calc)   AG Ratio 1.7 1.0 - 2.5 (calc)   Total Bilirubin 0.4 0.2 - 1.2 mg/dL   Alkaline phosphatase (APISO) 59 35 - 144 U/L   AST 20 10 - 35 U/L   ALT 26 9 - 46 U/L  Lipid panel     Status: None   Collection Time: 01/19/23  4:01 PM  Result Value Ref Range   Cholesterol 121 <200 mg/dL   HDL 40 > OR = 40 mg/dL   Triglycerides 295 <284 mg/dL   LDL Cholesterol (Calc) 61 mg/dL (calc)    Comment: Reference range: <100 . Desirable range <100 mg/dL for primary prevention;   <70 mg/dL for patients with CHD or diabetic patients  with > or = 2 CHD risk factors. Marland Kitchen LDL-C is now calculated using the Martin-Hopkins  calculation, which is a validated novel method providing  better accuracy than the Friedewald equation in the  estimation of LDL-C.  Horald Pollen et al. Lenox Ahr. 1324;401(02): 2061-2068  (http://education.[facility].com/faq/FAQ164)    Total CHOL/HDL Ratio 3.0 <5.0 (calc)   Non-HDL Cholesterol (Calc) 81 <725 mg/dL (calc)    Comment: For patients with diabetes plus 1 major ASCVD risk  factor, treating to a non-HDL-C goal of <100 mg/dL  (LDL-C of <36 mg/dL) is considered a therapeutic  option.   Magnesium     Status: None   Collection Time: 01/19/23  4:01 PM  Result Value Ref Range   Magnesium 2.1 1.5 - 2.5 mg/dL        Garner Nash, MD, MS

## 2023-03-23 ENCOUNTER — Encounter: Payer: Medicare Other | Admitting: Internal Medicine

## 2023-03-28 ENCOUNTER — Ambulatory Visit (INDEPENDENT_AMBULATORY_CARE_PROVIDER_SITE_OTHER): Admitting: Family Medicine

## 2023-03-28 ENCOUNTER — Encounter: Payer: Self-pay | Admitting: Family Medicine

## 2023-03-28 VITALS — BP 128/86 | HR 71 | Temp 97.8°F | Wt 218.6 lb

## 2023-03-28 DIAGNOSIS — R1030 Lower abdominal pain, unspecified: Secondary | ICD-10-CM | POA: Diagnosis not present

## 2023-03-28 DIAGNOSIS — K219 Gastro-esophageal reflux disease without esophagitis: Secondary | ICD-10-CM | POA: Diagnosis not present

## 2023-03-28 DIAGNOSIS — R0601 Orthopnea: Secondary | ICD-10-CM

## 2023-03-28 DIAGNOSIS — R140 Abdominal distension (gaseous): Secondary | ICD-10-CM | POA: Diagnosis not present

## 2023-03-28 DIAGNOSIS — R001 Bradycardia, unspecified: Secondary | ICD-10-CM

## 2023-03-28 DIAGNOSIS — E782 Mixed hyperlipidemia: Secondary | ICD-10-CM

## 2023-03-28 DIAGNOSIS — N1832 Chronic kidney disease, stage 3b: Secondary | ICD-10-CM | POA: Diagnosis not present

## 2023-03-28 DIAGNOSIS — I10 Essential (primary) hypertension: Secondary | ICD-10-CM

## 2023-03-28 LAB — CBC WITH DIFFERENTIAL/PLATELET
Basophils Absolute: 0.1 10*3/uL (ref 0.0–0.1)
Basophils Relative: 0.6 % (ref 0.0–3.0)
Eosinophils Absolute: 0.1 10*3/uL (ref 0.0–0.7)
Eosinophils Relative: 1.6 % (ref 0.0–5.0)
HCT: 52.1 % — ABNORMAL HIGH (ref 39.0–52.0)
Hemoglobin: 17.4 g/dL — ABNORMAL HIGH (ref 13.0–17.0)
Lymphocytes Relative: 16 % (ref 12.0–46.0)
Lymphs Abs: 1.5 10*3/uL (ref 0.7–4.0)
MCHC: 33.4 g/dL (ref 30.0–36.0)
MCV: 91.5 fl (ref 78.0–100.0)
Monocytes Absolute: 0.5 10*3/uL (ref 0.1–1.0)
Monocytes Relative: 5.9 % (ref 3.0–12.0)
Neutro Abs: 6.9 10*3/uL (ref 1.4–7.7)
Neutrophils Relative %: 75.9 % (ref 43.0–77.0)
Platelets: 305 10*3/uL (ref 150.0–400.0)
RBC: 5.69 Mil/uL (ref 4.22–5.81)
RDW: 14.6 % (ref 11.5–15.5)
WBC: 9.1 10*3/uL (ref 4.0–10.5)

## 2023-03-28 LAB — PSA: PSA: 7.36 ng/mL — ABNORMAL HIGH (ref 0.10–4.00)

## 2023-03-28 LAB — COMPREHENSIVE METABOLIC PANEL
ALT: 22 U/L (ref 0–53)
AST: 20 U/L (ref 0–37)
Albumin: 4.6 g/dL (ref 3.5–5.2)
Alkaline Phosphatase: 62 U/L (ref 39–117)
BUN: 19 mg/dL (ref 6–23)
CO2: 29 meq/L (ref 19–32)
Calcium: 9.9 mg/dL (ref 8.4–10.5)
Chloride: 100 meq/L (ref 96–112)
Creatinine, Ser: 1.69 mg/dL — ABNORMAL HIGH (ref 0.40–1.50)
GFR: 38.91 mL/min — ABNORMAL LOW (ref >60.00)
Glucose, Bld: 97 mg/dL (ref 70–99)
Potassium: 4.5 meq/L (ref 3.5–5.1)
Sodium: 138 meq/L (ref 135–145)
Total Bilirubin: 0.8 mg/dL (ref 0.2–1.2)
Total Protein: 7.5 g/dL (ref 6.0–8.3)

## 2023-03-28 LAB — BRAIN NATRIURETIC PEPTIDE: Pro B Natriuretic peptide (BNP): 64 pg/mL (ref 0.0–100.0)

## 2023-03-28 MED ORDER — SIMETHICONE 125 MG PO CAPS: ORAL_CAPSULE | ORAL | 0 refills | Status: AC

## 2023-03-28 MED ORDER — ESOMEPRAZOLE MAGNESIUM 40 MG PO CPDR: 40.0000 mg | DELAYED_RELEASE_CAPSULE | Freq: Every day | ORAL | 2 refills | Status: DC

## 2023-03-28 NOTE — Patient Instructions (Addendum)
 VISIT SUMMARY:  Jeffrey Carey, a 78 year old male, visited today due to abdominal discomfort and difficulty breathing, particularly at night. His symptoms are similar to past gallbladder issues, but he has no chest pain, leg swelling, or significant weight changes. He also has a history of Chronic Kidney Disease (CKD) Stage 3B, which is currently well-managed.  YOUR PLAN:  -ABDOMINAL DISCOMFORT WITH DYSPNEA: Your abdominal discomfort and difficulty breathing at night may be due to GERD or heartburn, and we are also considering potential heart issues. GERD is a condition where stomach acid frequently flows back into the tube connecting your mouth and stomach, causing discomfort. We have prescribed Nexium for GERD and recommend simethicone for bloating. Please avoid heavy, greasy meals before bed and try elevating the head of your bed. We have ordered several tests, including an EKG, chest X-ray, BNP test, urine studies, metabolic panel, blood counts, sleep study, and an echocardiogram. You will also be referred to a cardiologist. Additionally, we have ordered an H. pylori breath test to rule out any bacterial infection.  For chest xray, go to:    [facility] at [facility] [address] [place], [place], [place] [address] Phone: [phone]   -CHRONIC KIDNEY DISEASE (CKD) STAGE 3B: Chronic Kidney Disease Stage 3B means your kidneys are moderately damaged and not working as well as they should. Your condition is currently well-managed, and we will continue to monitor your kidney function with periodic metabolic panels.  -GENERAL HEALTH MAINTENANCE: We discussed lifestyle modifications to help manage your GERD symptoms, including dietary changes and elevating the head of your bed. You do not consume alcohol and have minimal caffeine intake, which is good for your overall health.  INSTRUCTIONS:  Please follow up with the cardiologist as referred. Complete the ordered tests, including the EKG, chest X-ray,  BNP test, urine studies, metabolic panel, blood counts, echocardiogram, and H. pylori breath test. Continue monitoring your kidney function with periodic metabolic panels.

## 2023-03-28 NOTE — Progress Notes (Signed)
 Assessment/Plan:    Assessment & Plan Abdominal Discomfort with Dyspnea Jeffrey Carey presents with abdominal discomfort and dyspnea, particularly at night. Symptoms are reminiscent of previous gallbladder issues resolved post-cholecystectomy. Differential diagnosis includes GERD, heartburn, BPH with urinary obstruction, and heart failure or other potential cardiac issues. He experiences orthopnea and bloating, but no chest pain, leg swelling, or significant weight changes. Concern exists for heart failure due to orthopnea and dyspnea, but absence of edema or weight gain is noted. GERD is likely given bloating and partial relief with Mylanta. EKG with sinus bradycardia but no obvious signs of hypertrophy or ischemia. No history of sleep study, recommend screening for sleep apnea - Order EKG, chest X-ray, BNP test, urine studies, metabolic panel, and blood counts - Order echocardiogram - Refer to cardiologist - Prescribe Nexium for GERD - Recommend simethicone for bloating - Sleep Study - Advise against heavy, greasy meals before bed - Recommend elevating head of bed - Order H. pylori breath test  Chronic Kidney Disease (CKD) Stage 3B Conner has CKD stage 3B with a GFR of 43 and creatinine level of 1.6. The condition is well-managed based on recent lab work from January. - Monitor kidney function with periodic metabolic panels  General Health Maintenance Advised on lifestyle modifications to manage GERD symptoms, including dietary changes and bed elevation. He does not consume alcohol and has minimal caffeine intake. - Advise on lifestyle modifications to manage GERD symptoms      There are no discontinued medications.  Return in 4 weeks (on 04/25/2023).    Subjective:   Encounter date: 03/28/2023  Jeffrey Carey is a 78 y.o. male who has History of colonic polyps; Essential hypertension; Hyperlipidemia, mixed; Abnormal glucose; Vitamin D deficiency; Gastroesophageal reflux disease;  Obesity (BMI 30.0-34.9); CKD (chronic kidney disease) stage 3, GFR 30-59 ml/min (HCC); History of prediabetes; and ED (erectile dysfunction) on their problem list..   He  has a past medical history of Chronic kidney disease, Dyslipidemia, ED (erectile dysfunction), GERD (gastroesophageal reflux disease), History of kidney stones, Hyperlipidemia, Hypertension, Radiculopathy (04/30/2014), and Vitamin D deficiency.Marland Kitchen   He presents with chief complaint of Abdominal concerns (Lower stomach pressure. Feels like pt has to take a deep breath for relief. X 1 week.) .   Discussed the use of AI scribe software for clinical note transcription with the patient, who gave verbal consent to proceed.  History of Present Illness Jeffrey Carey is a 78 year old male who presents with abdominal discomfort and difficulty breathing.  He experiences a persistent feeling of fullness in the lower abdomen, described as an ache, which often leads to difficulty taking deep breaths. This sensation is similar to a panic attack and wakes him up at night, prompting him to walk around for relief. He recalls a similar episode in the past related to his gallbladder, which was removed, and he had been symptom-free until now. The discomfort is most pronounced when lying down at night and is alleviated by sitting up or propping himself with pillows. No shortness of breath, chest pain, or pain upon pressing the stomach. He denies leg swelling, chest pain, or weight gain.  He notes frequent urination, attributed to high water intake of approximately five to six bottles a day. No blood in the urine, back pain, or difficulty urinating. He denies diarrhea, constipation, blood in the stool, or trouble urinating. He has one bowel movement a day, which is formed but not too hard.  He occasionally uses Mylanta at night when symptoms  are severe, which provides some relief but does not completely resolve the symptoms. He has not had any recent upper  endoscopy findings of GERD or reflux disease, although he had a similar episode of bloating and epigastric discomfort in the past, around 2011, which resolved with treatment for H. pylori.                           Past Surgical History:  Procedure Laterality Date   ANTERIOR CERVICAL DECOMP/DISCECTOMY FUSION N/A 04/30/2014   Procedure: ANTERIOR CERVICAL DECOMPRESSION/DISCECTOMY FUSION 1 LEVEL;  Surgeon: Estill Bamberg, MD;  Location: [facility] OR;  Service: Orthopedics;  Laterality: N/A;  Anterior cervical decompression fusion, cervical 7-thoracic 1 with instrumentation and allograft   COLONOSCOPY     HAMMER TOE SURGERY  2012   left foot, 3rd toe   LAPAROSCOPIC CHOLECYSTECTOMY  10/2010   UPPER GASTROINTESTINAL ENDOSCOPY      Outpatient Medications Prior to Visit  Medication Sig Dispense Refill   Acetaminophen (TYLENOL PO) Take by mouth. PRN     aspirin 81 MG tablet Take 81 mg by mouth at bedtime.      atenolol (TENORMIN) 100 MG tablet TAKE 1 TABLET BY MOUTH EVERY DAY FOR BLOOD PRESSURE 90 tablet 3   CINNAMON PO Take 1,000 mg by mouth daily.     Flaxseed, Linseed, (FLAXSEED OIL PO) Take 1 capsule by mouth daily.     Fluticasone Propionate (FLONASE NA) Place into the nose.     Magnesium 250 MG TABS Take by mouth.     Omega-3 Fatty Acids (FISH OIL PO) Take 1,200 mg by mouth at bedtime.      pravastatin (PRAVACHOL) 40 MG tablet TAKE 1 TABLET BY MOUTH EVERY DAY AT BEDTIME FOR CHOLESTEROL 90 tablet 3   VITAMIN D PO Take 5,000 Units by mouth daily.     No facility-administered medications prior to visit.    Family History  Problem Relation Age of Onset   Colon cancer Mother 38   Ovarian cancer Mother    Colon cancer Father 67   Diabetes Father    Alzheimer's disease Father    Prostate cancer Father    Stomach cancer Neg Hx    Colon polyps Neg Hx    Esophageal cancer Neg Hx    Rectal cancer Neg Hx     Social History   Socioeconomic History   Marital status: Married    Spouse name:  Not on file   Number of children: 1   Years of education: Not on file   Highest education level: Not on file  Occupational History   Not on file  Tobacco Use   Smoking status: Never   Smokeless tobacco: Never  Vaping Use   Vaping status: Never Used  Substance and Sexual Activity   Alcohol use: No   Drug use: No   Sexual activity: Not on file  Other Topics Concern   Not on file  Social History Narrative   Lives with wife.  Was a Psychologist, educational.     Social Drivers of Corporate investment banker Strain: Not on file  Food Insecurity: Not on file  Transportation Needs: Not on file  Physical Activity: Not on file  Stress: Not on file  Social Connections: Not on file  Intimate Partner Violence: Not on file  Objective:  Physical Exam: BP 128/86   Pulse 71   Temp 97.8 F (36.6 C) (Temporal)   Wt 218 lb 9.6 oz (99.2 kg)   SpO2 97%   BMI 33.13 kg/m   Wt Readings from Last 3 Encounters:  03/28/23 218 lb 9.6 oz (99.2 kg)  03/17/23 218 lb (98.9 kg)  01/19/23 221 lb 6.4 oz (100.4 kg)    Physical Exam VITALS: BP- 128/68 GENERAL: Alert, cooperative, well developed, no acute distress. HEENT: Normocephalic, normal oropharynx, moist mucous membranes. CHEST: Clear to auscultation bilaterally, no wheezes, rhonchi, or crackles. CARDIOVASCULAR: Normal heart rate and rhythm, S1 and S2 normal without murmurs. ABDOMEN: Soft, non-tender, non-distended, without organomegaly, normal bowel sounds. RECTAL: Prostate normal size and consistency, non-tender. EXTREMITIES: No cyanosis or edema. NEUROLOGICAL: Cranial nerves grossly intact, moves all extremities without gross motor or sensory deficit.     No results found.  Recent Results (from the past 2160 hours)  CBC with Differential/Platelet     Status: None   Collection Time: 01/19/23  4:01 PM  Result Value Ref Range    WBC 6.9 3.8 - 10.8 Thousand/uL   RBC 5.41 4.20 - 5.80 Million/uL   Hemoglobin 16.1 13.2 - 17.1 g/dL   HCT 16.1 09.6 - 04.5 %   MCV 88.7 80.0 - 100.0 fL   MCH 29.8 27.0 - 33.0 pg   MCHC 33.5 32.0 - 36.0 g/dL    Comment: For adults, a slight decrease in the calculated MCHC value (in the range of 30 to 32 g/dL) is most likely not clinically significant; however, it should be interpreted with caution in correlation with other red cell parameters and the patient's clinical condition.    RDW 13.1 11.0 - 15.0 %   Platelets 320 140 - 400 Thousand/uL   MPV 8.7 7.5 - 12.5 fL   Neutro Abs 3,878 1,500 - 7,800 cells/uL   Absolute Lymphocytes 1,842 850 - 3,900 cells/uL   Absolute Monocytes 711 200 - 950 cells/uL   Eosinophils Absolute 421 15 - 500 cells/uL   Basophils Absolute 48 0 - 200 cells/uL   Neutrophils Relative % 56.2 %   Total Lymphocyte 26.7 %   Monocytes Relative 10.3 %   Eosinophils Relative 6.1 %   Basophils Relative 0.7 %  COMPLETE METABOLIC PANEL WITH GFR     Status: Abnormal   Collection Time: 01/19/23  4:01 PM  Result Value Ref Range   Glucose, Bld 80 65 - 99 mg/dL    Comment: .            Fasting reference interval .    BUN 21 7 - 25 mg/dL   Creat 4.09 (H) 8.11 - 1.28 mg/dL   eGFR 45 (L) > OR = 60 mL/min/1.63m2   BUN/Creatinine Ratio 13 6 - 22 (calc)   Sodium 139 135 - 146 mmol/L   Potassium 5.3 3.5 - 5.3 mmol/L   Chloride 102 98 - 110 mmol/L   CO2 29 20 - 32 mmol/L   Calcium 9.4 8.6 - 10.3 mg/dL   Total Protein 6.5 6.1 - 8.1 g/dL   Albumin 4.1 3.6 - 5.1 g/dL   Globulin 2.4 1.9 - 3.7 g/dL (calc)   AG Ratio 1.7 1.0 - 2.5 (calc)   Total Bilirubin 0.4 0.2 - 1.2 mg/dL   Alkaline phosphatase (APISO) 59 35 - 144 U/L   AST 20 10 - 35 U/L   ALT 26 9 - 46 U/L  Lipid panel     Status: None  Collection Time: 01/19/23  4:01 PM  Result Value Ref Range   Cholesterol 121 <200 mg/dL   HDL 40 > OR = 40 mg/dL   Triglycerides 295 <284 mg/dL   LDL Cholesterol (Calc) 61  mg/dL (calc)    Comment: Reference range: <100 . Desirable range <100 mg/dL for primary prevention;   <70 mg/dL for patients with CHD or diabetic patients  with > or = 2 CHD risk factors. Marland Kitchen LDL-C is now calculated using the Martin-Hopkins  calculation, which is a validated novel method providing  better accuracy than the Friedewald equation in the  estimation of LDL-C.  Horald Pollen et al. Lenox Ahr. 1324;401(02): 2061-2068  (http://education.[facility].com/faq/FAQ164)    Total CHOL/HDL Ratio 3.0 <5.0 (calc)   Non-HDL Cholesterol (Calc) 81 <725 mg/dL (calc)    Comment: For patients with diabetes plus 1 major ASCVD risk  factor, treating to a non-HDL-C goal of <100 mg/dL  (LDL-C of <36 mg/dL) is considered a therapeutic  option.   Magnesium     Status: None   Collection Time: 01/19/23  4:01 PM  Result Value Ref Range   Magnesium 2.1 1.5 - 2.5 mg/dL  CBC and differential     Status: None   Collection Time: 01/23/23 12:00 AM  Result Value Ref Range   Hemoglobin 17.0 13.5 - 17.5  Basic metabolic panel     Status: Abnormal   Collection Time: 01/23/23 12:00 AM  Result Value Ref Range   Glucose 108    BUN 20 4 - 21   CO2 31 (A) 13 - 22   Creatinine 1.6 (A) 0.6 - 1.3   Potassium 4.8 3.5 - 5.1 mEq/L   Sodium 140 137 - 147   Chloride 102 99 - 108  Comprehensive metabolic panel     Status: None   Collection Time: 01/23/23 12:00 AM  Result Value Ref Range   eGFR 43    Calcium 9.9 8.7 - 10.7   Albumin 4.3 3.5 - 5.0        Garner Nash, MD, MS

## 2023-03-30 ENCOUNTER — Encounter: Payer: Self-pay | Admitting: Family Medicine

## 2023-03-30 ENCOUNTER — Ambulatory Visit (INDEPENDENT_AMBULATORY_CARE_PROVIDER_SITE_OTHER)
Admission: RE | Admit: 2023-03-30 | Discharge: 2023-03-30 | Disposition: A | Discharge status: Home / Self Care | Source: Ambulatory Visit | Attending: Family Medicine | Admitting: Family Medicine

## 2023-03-30 ENCOUNTER — Other Ambulatory Visit: Payer: Self-pay | Admitting: Family Medicine

## 2023-03-30 DIAGNOSIS — R0601 Orthopnea: Secondary | ICD-10-CM

## 2023-03-30 DIAGNOSIS — R972 Elevated prostate specific antigen [PSA]: Secondary | ICD-10-CM

## 2023-03-30 LAB — URINALYSIS W MICROSCOPIC + REFLEX CULTURE
Bacteria, UA: NONE SEEN /HPF
Bilirubin Urine: NEGATIVE
Glucose, UA: NEGATIVE
Hyaline Cast: NONE SEEN /LPF
Ketones, ur: NEGATIVE
Leukocyte Esterase: NEGATIVE
Nitrites, Initial: NEGATIVE
Protein, ur: NEGATIVE
RBC / HPF: NONE SEEN /HPF (ref 0–2)
Specific Gravity, Urine: 1.013 (ref 1.001–1.035)
Squamous Epithelial / HPF: NONE SEEN /HPF (ref <=5)
WBC, UA: NONE SEEN /HPF (ref 0–5)
pH: 6 (ref 5.0–8.0)

## 2023-03-30 LAB — H. PYLORI BREATH TEST: H. pylori Breath Test: NOT DETECTED

## 2023-03-30 LAB — NO CULTURE INDICATED

## 2023-03-30 LAB — THYROID PANEL WITH TSH

## 2023-04-10 ENCOUNTER — Encounter: Payer: Self-pay | Admitting: Family Medicine

## 2023-04-14 ENCOUNTER — Ambulatory Visit (HOSPITAL_COMMUNITY)
Admission: RE | Admit: 2023-04-14 | Discharge: 2023-04-14 | Disposition: A | Discharge status: Home / Self Care | Source: Ambulatory Visit | Attending: Family Medicine | Admitting: Family Medicine

## 2023-04-14 DIAGNOSIS — R0609 Other forms of dyspnea: Secondary | ICD-10-CM | POA: Diagnosis not present

## 2023-04-14 DIAGNOSIS — E785 Hyperlipidemia, unspecified: Secondary | ICD-10-CM | POA: Diagnosis not present

## 2023-04-14 DIAGNOSIS — I129 Hypertensive chronic kidney disease with stage 1 through stage 4 chronic kidney disease, or unspecified chronic kidney disease: Secondary | ICD-10-CM | POA: Diagnosis not present

## 2023-04-14 DIAGNOSIS — R0601 Orthopnea: Secondary | ICD-10-CM | POA: Insufficient documentation

## 2023-04-14 DIAGNOSIS — N183 Chronic kidney disease, stage 3 unspecified: Secondary | ICD-10-CM | POA: Diagnosis not present

## 2023-04-14 DIAGNOSIS — R0600 Dyspnea, unspecified: Secondary | ICD-10-CM | POA: Diagnosis present

## 2023-04-14 LAB — ECHOCARDIOGRAM COMPLETE
AR max vel: 1.77 cm2
AV Area VTI: 2.19 cm2
AV Area mean vel: 1.97 cm2
AV Mean grad: 2 mmHg
AV Peak grad: 4.2 mmHg
Ao pk vel: 1.02 m/s
Area-P 1/2: 1.51 cm2
Calc EF: 59.8 %
MV VTI: 1.7 cm2
S' Lateral: 2.9 cm
Single Plane A2C EF: 56.3 %
Single Plane A4C EF: 60.6 %

## 2023-04-14 NOTE — Progress Notes (Signed)
  Echocardiogram 2D Echocardiogram has been performed.  Ocie Doyne RDCS 04/14/2023, 10:46 AM

## 2023-04-19 ENCOUNTER — Encounter: Payer: Medicare Other | Admitting: Internal Medicine

## 2023-04-19 ENCOUNTER — Encounter: Payer: Self-pay | Admitting: Family Medicine

## 2023-04-28 ENCOUNTER — Ambulatory Visit (INDEPENDENT_AMBULATORY_CARE_PROVIDER_SITE_OTHER): Admitting: Family Medicine

## 2023-04-28 VITALS — BP 134/82 | HR 77 | Temp 97.8°F | Wt 216.4 lb

## 2023-04-28 DIAGNOSIS — K5790 Diverticulosis of intestine, part unspecified, without perforation or abscess without bleeding: Secondary | ICD-10-CM

## 2023-04-28 DIAGNOSIS — I5189 Other ill-defined heart diseases: Secondary | ICD-10-CM | POA: Insufficient documentation

## 2023-04-28 DIAGNOSIS — R1030 Lower abdominal pain, unspecified: Secondary | ICD-10-CM | POA: Diagnosis not present

## 2023-04-28 DIAGNOSIS — K5904 Chronic idiopathic constipation: Secondary | ICD-10-CM | POA: Diagnosis not present

## 2023-04-28 DIAGNOSIS — R972 Elevated prostate specific antigen [PSA]: Secondary | ICD-10-CM | POA: Diagnosis not present

## 2023-04-28 DIAGNOSIS — K219 Gastro-esophageal reflux disease without esophagitis: Secondary | ICD-10-CM | POA: Diagnosis not present

## 2023-04-28 DIAGNOSIS — K648 Other hemorrhoids: Secondary | ICD-10-CM | POA: Insufficient documentation

## 2023-04-28 MED ORDER — POLYETHYLENE GLYCOL 3350 17 GM/SCOOP PO POWD: 17.0000 g | Freq: Two times a day (BID) | ORAL | 0 refills | Status: AC | PRN

## 2023-04-28 NOTE — Progress Notes (Signed)
 Assessment/Plan:    Assessment & Plan Constipation Chronic constipation with sensation of incomplete evacuation. Recent colonoscopy revealed small polyps, diverticulosis, and hemorrhoids, likely contributing to symptoms. No obstructing mass or malignancy. Current use of Miralax provides partial relief. - Increase Miralax to two capfuls daily with 8-16 ounces of fluid. - Encourage increased intake of fruits, vegetables, and fiber. - Consider adding probiotics from over-the-counter tablets or natural sources like yogurt. - Advise to return if symptoms persist despite increased Miralax dosage.  Diverticulosis and hemorrhoids Diverticulosis and hemorrhoids noted on colonoscopy, likely contributing to chronic constipation symptoms.  GERD Bloating and abdominal discomfort improved with heartburn medication and dietary modifications. Negative for H. pylori infection.  Grade 1 diastolic dysfunction Echocardiogram shows grade 1 diastolic dysfunction with mild left ventricular wall thickening and valve calcification. Ejection fraction is 55-60%. No significant stenosis. Follow-up with cardiologist scheduled for July. - Follow up with cardiologist in July.  Dermatitis Recent diagnosis of dermatitis by dermatologist. Awaiting prescribed cream from pharmacy.      There are no discontinued medications.  Return if symptoms worsen or fail to improve.    Subjective:   Encounter date: 04/28/2023  Jeffrey Carey is a 78 y.o. male who has History of colonic polyps; Essential hypertension; Hyperlipidemia, mixed; Abnormal glucose; Vitamin D  deficiency; Gastroesophageal reflux disease; Obesity (BMI 30.0-34.9); CKD (chronic kidney disease) stage 3, GFR 30-59 ml/min (HCC); History of prediabetes; ED (erectile dysfunction); Grade I diastolic dysfunction; Internal hemorrhoids; Diverticulosis; Chronic idiopathic constipation; Lower abdominal pain; and Elevated PSA, less than 10 ng/ml on their problem  list..   He  has a past medical history of Chronic kidney disease, Dyslipidemia, ED (erectile dysfunction), GERD (gastroesophageal reflux disease), History of kidney stones, Hyperlipidemia, Hypertension, Radiculopathy (04/30/2014), and Vitamin D  deficiency.Clementine Soulliere Aas   He presents with chief complaint of Medical Management of Chronic Issues (Follow up lower abdominal pain) .   Discussed the use of AI scribe software for clinical note transcription with the patient, who gave verbal consent to proceed.  History of Present Illness Jeffrey Carey is a 78 year old male who presents with lower abdominal discomfort and incomplete bowel movements.  He experiences lower abdominal discomfort and a sensation of incomplete bowel movements despite having daily bowel movements. The discomfort sometimes improves after a bowel movement. He uses Miralax in his coffee, which provides some relief, but he still feels incomplete. He drinks about four bottles of water daily. He also mentions eating smaller meals and trying Gas-X, which has helped with bloating. No urinary tract infection symptoms. Negative for H. pylori.  He has a history of high blood pressure and chronic kidney disease. An echocardiogram showed a normal ejection fraction of 55-60% and some thickening of the left ventricle wall. Calcifications were noted on one of the heart valves, but no significant stenosis was found. He has an upcoming appointment with a cardiologist in July.  He mentions a previous colonoscopy that revealed small polyps, diverticulosis, and hemorrhoids, which are often associated with chronic constipation.  He reports a recent dermatology visit where he was diagnosed with dermatitis and prescribed a cream, which he has not yet received from the pharmacy.     ROS  Past Surgical History:  Procedure Laterality Date   ANTERIOR CERVICAL DECOMP/DISCECTOMY FUSION N/A 04/30/2014   Procedure: ANTERIOR CERVICAL DECOMPRESSION/DISCECTOMY  FUSION 1 LEVEL;  Surgeon: Virl Grimes, MD;  Location: [facility] OR;  Service: Orthopedics;  Laterality: N/A;  Anterior cervical decompression fusion, cervical 7-thoracic 1 with instrumentation and allograft  COLONOSCOPY     HAMMER TOE SURGERY  2012   left foot, 3rd toe   LAPAROSCOPIC CHOLECYSTECTOMY  10/2010   UPPER GASTROINTESTINAL ENDOSCOPY      Outpatient Medications Prior to Visit  Medication Sig Dispense Refill   Acetaminophen  (TYLENOL  PO) Take by mouth. PRN     aspirin 81 MG tablet Take 81 mg by mouth at bedtime.      atenolol  (TENORMIN ) 100 MG tablet TAKE 1 TABLET BY MOUTH EVERY DAY FOR BLOOD PRESSURE 90 tablet 3   CINNAMON PO Take 1,000 mg by mouth daily.     esomeprazole  (NEXIUM ) 40 MG capsule Take 1 capsule (40 mg total) by mouth daily before breakfast. 30 minutes before meal 30 capsule 2   Flaxseed, Linseed, (FLAXSEED OIL PO) Take 1 capsule by mouth daily.     Fluticasone  Propionate (FLONASE  NA) Place into the nose.     Magnesium  250 MG TABS Take by mouth.     Omega-3 Fatty Acids (FISH OIL PO) Take 1,200 mg by mouth at bedtime.      pravastatin  (PRAVACHOL ) 40 MG tablet TAKE 1 TABLET BY MOUTH EVERY DAY AT BEDTIME FOR CHOLESTEROL 90 tablet 3   Simethicone  125 MG CAPS 125 to 250 mg orally as needed after meals and at bedtime for bloating 28 capsule 0   VITAMIN D  PO Take 5,000 Units by mouth daily.     No facility-administered medications prior to visit.    Family History  Problem Relation Age of Onset   Colon cancer Mother 37   Ovarian cancer Mother    Colon cancer Father 72   Diabetes Father    Alzheimer's disease Father    Prostate cancer Father    Stomach cancer Neg Hx    Colon polyps Neg Hx    Esophageal cancer Neg Hx    Rectal cancer Neg Hx     Social History   Socioeconomic History   Marital status: Married    Spouse name: Not on file   Number of children: 1   Years of education: Not on file   Highest education level: Not on file  Occupational History   Not  on file  Tobacco Use   Smoking status: Never   Smokeless tobacco: Never  Vaping Use   Vaping status: Never Used  Substance and Sexual Activity   Alcohol use: No   Drug use: No   Sexual activity: Not on file  Other Topics Concern   Not on file  Social History Narrative   Lives with wife.  Was a Psychologist, educational.     Social Drivers of Corporate investment banker Strain: Not on file  Food Insecurity: Not on file  Transportation Needs: Not on file  Physical Activity: Not on file  Stress: Not on file  Social Connections: Not on file  Intimate Partner Violence: Not on file                                                                                                  Objective:  Physical Exam: BP 134/82   Pulse  77   Temp 97.8 F (36.6 C) (Temporal)   Wt 216 lb 6.4 oz (98.2 kg)   SpO2 97%   BMI 32.80 kg/m    Physical Exam GENERAL: Alert, cooperative, well developed, no acute distress HEENT: Normocephalic, normal oropharynx, moist mucous membranes CHEST: Clear to auscultation bilaterally, no wheezes, rhonchi, or crackles CARDIOVASCULAR: Normal heart rate and rhythm, S1 and S2 normal without murmurs ABDOMEN: Soft, non-tender, non-distended, without organomegaly, normal bowel sounds EXTREMITIES: No cyanosis or edema NEUROLOGICAL: Cranial nerves grossly intact, moves all extremities without gross motor or sensory deficit     ECHOCARDIOGRAM COMPLETE Result Date: 04/14/2023    ECHOCARDIOGRAM REPORT   Patient Name:   DERVIN VORE Date of Exam: 04/14/2023 Medical Rec #:  [id number]      Height:       68.1 in Accession #:    [id number]     Weight:       218.6 lb Date of Birth:  Nov 01, 1945       BSA:          2.125 m Patient Age:    77 years       BP:           128/86 mmHg Patient Gender: M              HR:           60 bpm. Exam Location:  Outpatient Procedure: 2D Echo, Cardiac Doppler and Color Doppler (Both Spectral and Color            Flow Doppler were utilized  during procedure). Indications:    Dyspnea  History:        Patient has no prior history of Echocardiogram examinations.                 Risk Factors:Hypertension and Dyslipidemia. CKD stage 3.  Sonographer:    Juanita Shaw Referring Phys: Catheryn Cluck IMPRESSIONS  1. Left ventricular ejection fraction, by estimation, is 55 to 60%. The left ventricle has normal function. Left ventricular endocardial border not optimally defined to evaluate regional wall motion. There is mild left ventricular hypertrophy of the basal-septal segment. Left ventricular diastolic parameters are consistent with Grade I diastolic dysfunction (impaired relaxation).  2. Right ventricular systolic function is normal. The right ventricular size is normal. Tricuspid regurgitation signal is inadequate for assessing PA pressure.  3. The mitral valve is normal in structure. No evidence of mitral valve regurgitation. No evidence of mitral stenosis.  4. The aortic valve is tricuspid. Aortic valve regurgitation is not visualized. Aortic valve sclerosis/calcification is present, without any evidence of aortic stenosis. Aortic valve area, by VTI measures 2.19 cm. Aortic valve mean gradient measures 2.0 mmHg. Aortic valve Vmax measures 1.02 m/s. FINDINGS  Left Ventricle: Left ventricular ejection fraction, by estimation, is 55 to 60%. The left ventricle has normal function. Left ventricular endocardial border not optimally defined to evaluate regional wall motion. The left ventricular internal cavity size was normal in size. There is mild left ventricular hypertrophy of the basal-septal segment. Left ventricular diastolic parameters are consistent with Grade I diastolic dysfunction (impaired relaxation). Normal left ventricular filling pressure. Right Ventricle: The right ventricular size is normal. No increase in right ventricular wall thickness. Right ventricular systolic function is normal. Tricuspid regurgitation signal is inadequate for  assessing PA pressure. Left Atrium: Left atrial size was normal in size. Right Atrium: Right atrial size was normal in size. Pericardium: There is no evidence of pericardial effusion.  Mitral Valve: The mitral valve is normal in structure. No evidence of mitral valve regurgitation. No evidence of mitral valve stenosis. MV peak gradient, 3.6 mmHg. The mean mitral valve gradient is 1.0 mmHg. Tricuspid Valve: The tricuspid valve is normal in structure. Tricuspid valve regurgitation is trivial. No evidence of tricuspid stenosis. Aortic Valve: The aortic valve is tricuspid. Aortic valve regurgitation is not visualized. Aortic valve sclerosis/calcification is present, without any evidence of aortic stenosis. Aortic valve mean gradient measures 2.0 mmHg. Aortic valve peak gradient measures 4.2 mmHg. Aortic valve area, by VTI measures 2.19 cm. Pulmonic Valve: The pulmonic valve was normal in structure. Pulmonic valve regurgitation is not visualized. No evidence of pulmonic stenosis. Aorta: The aortic root is normal in size and structure. Venous: The inferior vena cava was not well visualized. IAS/Shunts: No atrial level shunt detected by color flow Doppler.  LEFT VENTRICLE PLAX 2D LVIDd:         4.50 cm      Diastology LVIDs:         2.90 cm      LV e' medial:    6.85 cm/s LV PW:         1.00 cm      LV E/e' medial:  7.1 LV IVS:        1.00 cm      LV e' lateral:   6.42 cm/s LVOT diam:     1.80 cm      LV E/e' lateral: 7.6 LV SV:         46 LV SV Index:   22 LVOT Area:     2.54 cm  LV Volumes (MOD) LV vol d, MOD A2C: 90.7 ml LV vol d, MOD A4C: 109.0 ml LV vol s, MOD A2C: 39.6 ml LV vol s, MOD A4C: 43.0 ml LV SV MOD A2C:     51.1 ml LV SV MOD A4C:     109.0 ml LV SV MOD BP:      61.3 ml RIGHT VENTRICLE RV Basal diam:  3.30 cm RV Mid diam:    2.30 cm RV S prime:     14.40 cm/s TAPSE (M-mode): 3.0 cm LEFT ATRIUM             Index        RIGHT ATRIUM           Index LA diam:        4.00 cm 1.88 cm/m   RA Area:     13.20 cm  LA Vol (A2C):   36.3 ml 17.08 ml/m  RA Volume:   31.00 ml  14.59 ml/m LA Vol (A4C):   31.3 ml 14.73 ml/m LA Biplane Vol: 35.4 ml 16.66 ml/m  AORTIC VALVE                    PULMONIC VALVE AV Area (Vmax):    1.77 cm     PV Vmax:       0.87 m/s AV Area (Vmean):   1.97 cm     PV Peak grad:  3.0 mmHg AV Area (VTI):     2.19 cm AV Vmax:           102.00 cm/s AV Vmean:          68.000 cm/s AV VTI:            0.210 m AV Peak Grad:      4.2 mmHg AV Mean Grad:      2.0  mmHg LVOT Vmax:         71.10 cm/s LVOT Vmean:        52.600 cm/s LVOT VTI:          0.181 m LVOT/AV VTI ratio: 0.86  AORTA Ao Root diam: 3.20 cm Ao Asc diam:  3.00 cm MITRAL VALVE MV Area (PHT): 1.51 cm    SHUNTS MV Area VTI:   1.70 cm    Systemic VTI:  0.18 m MV Peak grad:  3.6 mmHg    Systemic Diam: 1.80 cm MV Mean grad:  1.0 mmHg MV Vmax:       0.95 m/s MV Vmean:      46.2 cm/s MV Decel Time: 504 msec MV E velocity: 48.70 cm/s MV A velocity: 99.60 cm/s MV E/A ratio:  0.49 Gaylyn Keas MD Electronically signed by Gaylyn Keas MD Signature Date/Time: 04/14/2023/10:57:34 AM    Final    DG Chest 2 View Result Date: 04/08/2023 CLINICAL DATA:  dyspnea EXAM: CHEST - 2 VIEW COMPARISON:  02/16/2016. FINDINGS: Cardiac silhouette is unremarkable. No pneumothorax or pleural effusion. The lungs are clear. There are thoracic degenerative changes. IMPRESSION: No acute cardiopulmonary process. Electronically Signed   By: Sydell Eva M.D.   On: 04/08/2023 20:43    Recent Results (from the past 2160 hours)  H. pylori breath test     Status: None   Collection Time: 03/28/23  1:51 PM  Result Value Ref Range   H. pylori Breath Test NOT DETECTED NOT DETECTED    Comment: . Antimicrobials, proton pump inhibitors, and bismuth preparations are known to suppress H. pylori, and  ingestion of these prior to H. pylori diagnostic testing may lead to false negative results. If clinically  indicated, the test may be repeated on a new specimen obtained two  weeks after discontinuing treatment. However, a positive result is still clinically valid.   Comp Met (CMET)     Status: Abnormal   Collection Time: 03/28/23  1:51 PM  Result Value Ref Range   Sodium 138 135 - 145 mEq/L   Potassium 4.5 3.5 - 5.1 mEq/L   Chloride 100 96 - 112 mEq/L   CO2 29 19 - 32 mEq/L   Glucose, Bld 97 70 - 99 mg/dL   BUN 19 6 - 23 mg/dL   Creatinine, Ser 4.09 (H) 0.40 - 1.50 mg/dL   Total Bilirubin 0.8 0.2 - 1.2 mg/dL   Alkaline Phosphatase 62 39 - 117 U/L   AST 20 0 - 37 U/L   ALT 22 0 - 53 U/L   Total Protein 7.5 6.0 - 8.3 g/dL   Albumin 4.6 3.5 - 5.2 g/dL   GFR 81.19 (L) >14.78 mL/min    Comment: Calculated using the CKD-EPI Creatinine Equation (2021)   Calcium 9.9 8.4 - 10.5 mg/dL  CBC w/Diff     Status: Abnormal   Collection Time: 03/28/23  1:51 PM  Result Value Ref Range   WBC 9.1 4.0 - 10.5 K/uL   RBC 5.69 4.22 - 5.81 Mil/uL   Hemoglobin 17.4 (H) 13.0 - 17.0 g/dL   HCT 29.5 (H) 62.1 - 30.8 %   MCV 91.5 78.0 - 100.0 fl   MCHC 33.4 30.0 - 36.0 g/dL   RDW 65.7 84.6 - 96.2 %   Platelets 305.0 150.0 - 400.0 K/uL   Neutrophils Relative % 75.9 43.0 - 77.0 %   Lymphocytes Relative 16.0 12.0 - 46.0 %   Monocytes Relative 5.9 3.0 - 12.0 %  Eosinophils Relative 1.6 0.0 - 5.0 %   Basophils Relative 0.6 0.0 - 3.0 %   Neutro Abs 6.9 1.4 - 7.7 K/uL   Lymphs Abs 1.5 0.7 - 4.0 K/uL   Monocytes Absolute 0.5 0.1 - 1.0 K/uL   Eosinophils Absolute 0.1 0.0 - 0.7 K/uL   Basophils Absolute 0.1 0.0 - 0.1 K/uL  B Nat Peptide     Status: None   Collection Time: 03/28/23  1:51 PM  Result Value Ref Range   Pro B Natriuretic peptide (BNP) 64.0 0.0 - 100.0 pg/mL  Urinalysis w microscopic + reflex cultur     Status: Abnormal   Collection Time: 03/28/23  1:51 PM   Specimen: Other  Result Value Ref Range   Color, Urine YELLOW YELLOW   APPearance CLEAR CLEAR   Specific Gravity, Urine 1.013 1.001 - 1.035   pH 6.0 5.0 - 8.0   Glucose, UA NEGATIVE NEGATIVE   Bilirubin  Urine NEGATIVE NEGATIVE   Ketones, ur NEGATIVE NEGATIVE   Hgb urine dipstick 1+ (A) NEGATIVE   Protein, ur NEGATIVE NEGATIVE   Nitrites, Initial NEGATIVE NEGATIVE   Leukocyte Esterase NEGATIVE NEGATIVE   WBC, UA NONE SEEN 0 - 5 /HPF   RBC / HPF NONE SEEN 0 - 2 /HPF   Squamous Epithelial / HPF NONE SEEN < OR = 5 /HPF   Bacteria, UA NONE SEEN NONE SEEN /HPF   Hyaline Cast NONE SEEN NONE SEEN /LPF   Note      Comment: This urine was analyzed for the presence of WBC,  RBC, bacteria, casts, and other formed elements.  Only those elements seen were reported. . .   PSA     Status: Abnormal   Collection Time: 03/28/23  1:51 PM  Result Value Ref Range   PSA 7.36 (H) 0.10 - 4.00 ng/mL    Comment: Test performed using Access Hybritech PSA Assay, a parmagnetic partical, chemiluminecent immunoassay.  Thyroid  Panel With TSH     Status: None   Collection Time: 03/28/23  1:51 PM  Result Value Ref Range   T3 Uptake CANCELED     Comment: TEST NOT PERFORMED . No serum received.  Result canceled by the ancillary.    TSH CANCELED     Comment: TEST NOT PERFORMED . No serum received.  Result canceled by the ancillary.   REFLEXIVE URINE CULTURE     Status: None   Collection Time: 03/28/23  1:51 PM  Result Value Ref Range   Reflexve Urine Culture      Comment: NO CULTURE INDICATED  ECHOCARDIOGRAM COMPLETE     Status: None   Collection Time: 04/14/23 10:46 AM  Result Value Ref Range   S' Lateral 2.90 cm   AV Area VTI 2.19 cm2   AV Mean grad 2.0 mmHg   Single Plane A4C EF 60.6 %   Single Plane A2C EF 56.3 %   Calc EF 59.8 %   AV Area mean vel 1.97 cm2   Area-P 1/2 1.51 cm2   AR max vel 1.77 cm2   AV Peak grad 4.2 mmHg   Ao pk vel 1.02 m/s   MV VTI 1.70 cm2   Est EF 55 - 60%         Carnell Christian, MD, MS

## 2023-04-28 NOTE — Patient Instructions (Signed)
  VISIT SUMMARY: You came in today with concerns about lower abdominal discomfort and incomplete bowel movements. We discussed your symptoms, including your use of Miralax, dietary habits, and recent medical history. We also reviewed your recent colonoscopy results and echocardiogram findings. Additionally, we talked about your recent dermatology visit and the prescribed treatment for dermatitis.  YOUR PLAN: -CONSTIPATION: Constipation is when you have infrequent or difficult bowel movements. To help with this, increase your Miralax to two capfuls daily with 8-16 ounces of fluid. Also, try to eat more fruits, vegetables, and fiber, and consider adding probiotics from over-the-counter tablets or natural sources like yogurt. Please return if your symptoms persist despite these changes.  -DIVERTICULOSIS AND HEMORRHOIDS: Diverticulosis is when small pouches form in the colon, and hemorrhoids are swollen veins in the lower rectum. Both can contribute to constipation. No new treatment is needed at this time beyond what is being done for constipation.  -GERD: GERD is when stomach acid frequently flows back into the tube connecting your mouth and stomach, causing discomfort. Your bloating and abdominal discomfort have improved with heartburn medication and dietary changes. No further action is needed at this time.  -GRADE 1 DIASTOLIC DYSFUNCTION: This is a mild condition where the heart's ability to relax and fill with blood is slightly impaired. Your echocardiogram shows mild left ventricular wall thickening and valve calcification, but your heart's pumping ability is normal. You have a follow-up with your cardiologist in July.  -DERMATITIS: Dermatitis is inflammation of the skin. You were recently diagnosed with this condition and are awaiting a prescribed cream from the pharmacy.  INSTRUCTIONS: Please follow up with your cardiologist in July as scheduled. If your constipation symptoms persist despite the  increased Miralax dosage and dietary changes, please return for further evaluation.                      Contains text generated by Abridge.                                 Contains text generated by Abridge.

## 2023-05-12 ENCOUNTER — Other Ambulatory Visit: Payer: Self-pay | Admitting: Family Medicine

## 2023-05-12 DIAGNOSIS — I10 Essential (primary) hypertension: Secondary | ICD-10-CM

## 2023-05-12 DIAGNOSIS — E782 Mixed hyperlipidemia: Secondary | ICD-10-CM

## 2023-05-16 NOTE — Telephone Encounter (Unsigned)
 Copied from CRM [id number]. Topic: Clinical - Medication Refill >> May 16, 2023  4:31 PM Magdalene School wrote: Medication:  atenolol  (TENORMIN ) 100 MG tablet pravastatin  (PRAVACHOL ) 40 MG tablet   Has the patient contacted their pharmacy? Yes (Agent: If no, request that the patient contact the pharmacy for the refill. If patient does not wish to contact the pharmacy document the reason why and proceed with request.) (Agent: If yes, when and what did the pharmacy advise?) Pharmacy stated that they sent refill request on 05/12/23.  This is the patient's preferred pharmacy:  [facility] [address] [place] - [address] [place] [address] Phone: [phone] Fax: [phone]  Is this the correct pharmacy for this prescription? Yes If no, delete pharmacy and type the correct one.   Has the prescription been filled recently? No  Is the patient out of the medication? Yes  Has the patient been seen for an appointment in the last year OR does the patient have an upcoming appointment? Yes  Can we respond through MyChart? Yes  Agent: Please be advised that Rx refills may take up to 3 business days. We ask that you follow-up with your pharmacy.

## 2023-07-17 ENCOUNTER — Ambulatory Visit: Admitting: Family Medicine

## 2023-07-18 ENCOUNTER — Ambulatory Visit: Payer: Medicare Other | Admitting: Nurse Practitioner

## 2023-07-18 ENCOUNTER — Ambulatory Visit (INDEPENDENT_AMBULATORY_CARE_PROVIDER_SITE_OTHER): Admitting: Family Medicine

## 2023-07-18 VITALS — BP 122/78 | HR 51 | Temp 97.9°F | Ht 68.11 in | Wt 213.0 lb

## 2023-07-18 DIAGNOSIS — R001 Bradycardia, unspecified: Secondary | ICD-10-CM | POA: Diagnosis not present

## 2023-07-18 DIAGNOSIS — K219 Gastro-esophageal reflux disease without esophagitis: Secondary | ICD-10-CM | POA: Diagnosis not present

## 2023-07-18 DIAGNOSIS — I10 Essential (primary) hypertension: Secondary | ICD-10-CM | POA: Diagnosis not present

## 2023-07-18 DIAGNOSIS — K5904 Chronic idiopathic constipation: Secondary | ICD-10-CM

## 2023-07-18 DIAGNOSIS — R972 Elevated prostate specific antigen [PSA]: Secondary | ICD-10-CM

## 2023-07-18 DIAGNOSIS — E782 Mixed hyperlipidemia: Secondary | ICD-10-CM

## 2023-07-18 MED ORDER — ATENOLOL 100 MG PO TABS: ORAL_TABLET | ORAL | 3 refills | Status: AC

## 2023-07-18 NOTE — Progress Notes (Signed)
 Assessment & Plan   Assessment/Plan:    Assessment & Plan Hypertension Hypertension is well-controlled with atenolol  100 mg, but pulse rate is slightly low, likely due to atenolol . - Consult cardiologist regarding reducing atenolol  dosage to 50 mg. - Monitor heart rate and blood pressure at home. - Follow up with cardiologist on Friday. - Reassess heart rate and blood pressure in two weeks with primary care.  Hyperlipidemia Hyperlipidemia is well-managed with pravastatin  40 mg. Recent LDL cholesterol level was 61. Liver function tests are stable.  Prostate-Specific Antigen (PSA) Elevation PSA level is slightly elevated at 7. Family history of prostate issues. Urologist is monitoring the situation without significant concern. Discussed newer imaging and DNA testing methods, noting potential insurance coverage issues. - Follow up with urologist for lab work next week and subsequent appointment the week after.  General Health Maintenance Engages in regular physical activity and has made dietary changes, including reducing sweets and maintaining hydration. Reports improved bowel function and no longer requires Miralax . Dermatological issues have resolved with treatment. - Encourage continued physical activity and hydration. - Maintain dietary changes to support overall health.      Medications Discontinued During This Encounter  Medication Reason   esomeprazole  (NEXIUM ) 40 MG capsule    atenolol  (TENORMIN ) 100 MG tablet     No follow-ups on file.        Subjective:   Encounter date: 07/18/2023  Jeffrey Carey is a 78 y.o. male who has History of colonic polyps; Essential hypertension; Hyperlipidemia, mixed; Abnormal glucose; Vitamin D  deficiency; Gastroesophageal reflux disease; Obesity (BMI 30.0-34.9); CKD (chronic kidney disease) stage 3, GFR 30-59 ml/min (HCC); History of prediabetes; ED (erectile dysfunction); Grade I diastolic dysfunction; Internal hemorrhoids;  Diverticulosis; Chronic idiopathic constipation; Lower abdominal pain; Elevated PSA, less than 10 ng/ml; and Bradycardia on their problem list..   He  has a past medical history of Chronic kidney disease, Dyslipidemia, ED (erectile dysfunction), GERD (gastroesophageal reflux disease), History of kidney stones, Hyperlipidemia, Hypertension, Radiculopathy (04/30/2014), and Vitamin D  deficiency.[name]   He presents with chief complaint of Follow-up (54mo constipation f/u; constipation subsided, doing really good, nothing new to report) .   Discussed the use of AI scribe software for clinical note transcription with the patient, who gave verbal consent to proceed.  History of Present Illness Jeffrey Carey is a 78 year old male who presents for a follow-up visit.  He has resolved his previous issues with constipation and abdominal discomfort through increased water intake, consuming about 64 ounces daily, and a diet rich in fiber. He has also been engaging in physical activities such as yard work, which he enjoys.  He has a history of dermatitis for which he was prescribed a cream by a dermatologist, and the condition has cleared up.  He is scheduled to see a cardiologist and has a follow-up with a urologist due to a slightly elevated PSA level of 7. He notes a family history of prostate issues, as his father had similar problems.  He has been on Timolol 100 mg and pravastatin  40 mg for many years. His cholesterol levels were last checked in January, showing an LDL of 61. He is no longer taking Nexium  for heartburn.  No chest pain, shortness of breath, or leg swelling. He has noticed a decrease in appetite and has lost a few pounds, which he attributes to cutting out sweets and increased physical activity. His blood pressure has been stable, and he reports that his pulse rate has always been on  the lower side, though he has not received an explanation for this.  He had an echocardiogram previously, which  showed stable results with no wall motion abnormalities.     ROS  Past Surgical History:  Procedure Laterality Date   ANTERIOR CERVICAL DECOMP/DISCECTOMY FUSION N/A 04/30/2014   Procedure: ANTERIOR CERVICAL DECOMPRESSION/DISCECTOMY FUSION 1 LEVEL;  Surgeon: Oneil Priestly, MD;  Location: [facility] OR;  Service: Orthopedics;  Laterality: N/A;  Anterior cervical decompression fusion, cervical 7-thoracic 1 with instrumentation and allograft   COLONOSCOPY     HAMMER TOE SURGERY  2012   left foot, 3rd toe   LAPAROSCOPIC CHOLECYSTECTOMY  10/2010   UPPER GASTROINTESTINAL ENDOSCOPY      Outpatient Medications Prior to Visit  Medication Sig Dispense Refill   Acetaminophen  (TYLENOL  PO) Take by mouth. PRN     aspirin 81 MG tablet Take 81 mg by mouth at bedtime.      CINNAMON PO Take 1,000 mg by mouth daily.     Flaxseed, Linseed, (FLAXSEED OIL PO) Take 1 capsule by mouth daily.     Fluticasone  Propionate (FLONASE  NA) Place into the nose.     Magnesium  250 MG TABS Take by mouth.     Omega-3 Fatty Acids (FISH OIL PO) Take 1,200 mg by mouth at bedtime.      polyethylene glycol powder (GLYCOLAX /MIRALAX ) 17 GM/SCOOP powder Take 17 g by mouth 2 (two) times daily as needed for mild constipation. 500 g 0   pravastatin  (PRAVACHOL ) 40 MG tablet TAKE 1 TABLET BY MOUTH EVERY DAY AT BEDTIME FOR CHOLESTEROL 90 tablet 3   Simethicone  125 MG CAPS 125 to 250 mg orally as needed after meals and at bedtime for bloating 28 capsule 0   VITAMIN D  PO Take 5,000 Units by mouth daily.     atenolol  (TENORMIN ) 100 MG tablet TAKE 1 TABLET BY MOUTH EVERY DAY FOR BLOOD PRESSURE 90 tablet 3   esomeprazole  (NEXIUM ) 40 MG capsule Take 1 capsule (40 mg total) by mouth daily before breakfast. 30 minutes before meal 30 capsule 2   No facility-administered medications prior to visit.    Family History  Problem Relation Age of Onset   Colon cancer Mother 59   Ovarian cancer Mother    Colon cancer Father 71   Diabetes Father     Alzheimer's disease Father    Prostate cancer Father    Stomach cancer Neg Hx    Colon polyps Neg Hx    Esophageal cancer Neg Hx    Rectal cancer Neg Hx     Social History   Socioeconomic History   Marital status: Married    Spouse name: Not on file   Number of children: 1   Years of education: Not on file   Highest education level: Not on file  Occupational History   Not on file  Tobacco Use   Smoking status: Never   Smokeless tobacco: Never  Vaping Use   Vaping status: Never Used  Substance and Sexual Activity   Alcohol use: No   Drug use: No   Sexual activity: Not on file  Other Topics Concern   Not on file  Social History Narrative   Lives with wife.  Was a Psychologist, educational.     Social Drivers of Corporate investment banker Strain: Not on file  Food Insecurity: Not on file  Transportation Needs: Not on file  Physical Activity: Not on file  Stress: Not on file  Social Connections: Not on file  Intimate Partner Violence: Not on file                                                                                                  Objective:  Physical Exam: BP 122/78   Pulse (!) 51   Temp 97.9 F (36.6 C)   Ht 5' 8.11 (1.73 m)   Wt 213 lb (96.6 kg)   SpO2 97%   BMI 32.28 kg/m    Physical Exam VITALS: P- 51 GENERAL: Alert, cooperative, well developed, no acute distress HEENT: Normocephalic, normal oropharynx, moist mucous membranes CHEST: Clear to auscultation bilaterally, no wheezes, rhonchi, or crackles CARDIOVASCULAR: Normal heart rate and rhythm, S1 and S2 normal without murmurs ABDOMEN: Soft, non-tender, non-distended, without organomegaly, normal bowel sounds EXTREMITIES: No cyanosis or edema NEUROLOGICAL: Cranial nerves grossly intact, moves all extremities without gross motor or sensory deficit   Physical Exam  No results found.  No results found for this or any previous visit (from the past 2160 hours).      Beverley Adine Hummer, MD, MS

## 2023-07-20 NOTE — Progress Notes (Unsigned)
 Cardiology Office Note   Date:  07/21/2023   ID:  Jeffrey Carey, Jeffrey Carey [birth year], MRN [id number]  PCP:  Jeffrey Beverley NOVAK, MD  Cardiologist:   None Referring:  Jeffrey Beverley NOVAK, MD  Chief Complaint  Patient presents with   Shortness of Breath      History of Present Illness: Jeffrey Carey is a 78 y.o. male who presents for for evaluation of SOB.  He was having this symptom back in March when the appointment was made.  However, is really a sensation of just not being able to take a deep breath.  He was not having shortness of breath with activity.  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  He has not had any chest pressure, neck or arm discomfort.  He does not have any palpitations, presyncope or syncope.  He said this symptom actually of not being able to take a deep breath is now gone and he feels fine.  He has not had any prior cardiac history  He did have an echo which demonstrated NL LV function.    He had aortic valve sclerosis but no significant valvular abnormalities.    I saw him in 2017.  He had chest pain.    There was no anginal etiology   Past Medical History:  Diagnosis Date   Chronic kidney disease    Dyslipidemia    ED (erectile dysfunction)    GERD (gastroesophageal reflux disease)    History of kidney stones    Hyperlipidemia    Hypertension    Radiculopathy 04/30/2014   Vitamin D  deficiency     Past Surgical History:  Procedure Laterality Date   ANTERIOR CERVICAL DECOMP/DISCECTOMY FUSION N/A 04/30/2014   Procedure: ANTERIOR CERVICAL DECOMPRESSION/DISCECTOMY FUSION 1 LEVEL;  Surgeon: Oneil Priestly, MD;  Location: [facility] OR;  Service: Orthopedics;  Laterality: N/A;  Anterior cervical decompression fusion, cervical 7-thoracic 1 with instrumentation and allograft   COLONOSCOPY     HAMMER TOE SURGERY  2012   left foot, 3rd toe   LAPAROSCOPIC CHOLECYSTECTOMY  10/2010   UPPER GASTROINTESTINAL ENDOSCOPY       Current Outpatient Medications   Medication Sig Dispense Refill   Acetaminophen  (TYLENOL  PO) Take by mouth. PRN     aspirin 81 MG tablet Take 81 mg by mouth at bedtime.      atenolol  (TENORMIN ) 100 MG tablet TAKE 1 TABLET BY MOUTH EVERY DAY FOR BLOOD PRESSURE 90 tablet 3   CINNAMON PO Take 1,000 mg by mouth daily.     Flaxseed, Linseed, (FLAXSEED OIL PO) Take 1 capsule by mouth daily.     Fluticasone  Propionate (FLONASE  NA) Place into the nose.     Magnesium  250 MG TABS Take by mouth.     Omega-3 Fatty Acids (FISH OIL PO) Take 1,200 mg by mouth at bedtime.      polyethylene glycol powder (GLYCOLAX /MIRALAX ) 17 GM/SCOOP powder Take 17 g by mouth 2 (two) times daily as needed for mild constipation. 500 g 0   pravastatin  (PRAVACHOL ) 40 MG tablet TAKE 1 TABLET BY MOUTH EVERY DAY AT BEDTIME FOR CHOLESTEROL 90 tablet 3   Simethicone  125 MG CAPS 125 to 250 mg orally as needed after meals and at bedtime for bloating 28 capsule 0   VITAMIN D  PO Take 5,000 Units by mouth daily.     No current facility-administered medications for this visit.    Allergies:   Ceftin [cefuroxime axetil] and Peanut-containing drug products  Social History:  The patient  reports that he has never smoked. He has never used smokeless tobacco. He reports that he does not drink alcohol and does not use drugs.  His wife of 55 years died a couple years ago  Family History:  The patient's family history includes Alzheimer's disease in his father; Colon cancer (age of onset: 31) in his father and mother; Diabetes in his father; Ovarian cancer in his mother; Prostate cancer in his father.    ROS:  Please see the history of present illness.   Otherwise, review of systems are positive for none.   All other systems are reviewed and negative.    PHYSICAL EXAM: VS:  BP 138/89   Pulse (!) 57   Ht 5' 10 (1.778 m)   Wt 214 lb (97.1 kg)   SpO2 98%   BMI 30.71 kg/m  , BMI Body mass index is 30.71 kg/m. GENERAL:  Well appearing HEENT:  Pupils equal round and  reactive, fundi not visualized, oral mucosa unremarkable NECK:  No jugular venous distention, waveform within normal limits, carotid upstroke brisk and symmetric, no bruits, no thyromegaly LYMPHATICS:  No cervical, inguinal adenopathy LUNGS:  Clear to auscultation bilaterally BACK:  No CVA tenderness CHEST:  Unremarkable HEART:  PMI not displaced or sustained,S1 and S2 within normal limits, no S3, no S4, no clicks, no rubs, no murmurs ABD:  Flat, positive bowel sounds normal in frequency in pitch, no bruits, no rebound, no guarding, no midline pulsatile mass, no hepatomegaly, no splenomegaly EXT:  2 plus pulses throughout, no edema, no cyanosis no clubbing SKIN:  No rashes no nodules NEURO:  Cranial nerves II through XII grossly intact, motor grossly intact throughout PSYCH:  Cognitively intact, oriented to person place and time    EKG:        Recent Labs: 01/19/2023: Magnesium  2.1 03/28/2023: ALT 22; BUN 19; Creatinine, Ser 1.69; Hemoglobin 17.4; Platelets 305.0; Potassium 4.5; Pro B Natriuretic peptide (BNP) 64.0; Sodium 138; TSH CANCELED    Lipid Panel    Component Value Date/Time   CHOL 121 01/19/2023 1601   TRIG 114 01/19/2023 1601   HDL 40 01/19/2023 1601   CHOLHDL 3.0 01/19/2023 1601   VLDL 23 04/25/2016 1040   LDLCALC 61 01/19/2023 1601      Wt Readings from Last 3 Encounters:  07/21/23 214 lb (97.1 kg)  07/18/23 213 lb (96.6 kg)  04/28/23 216 lb 6.4 oz (98.2 kg)      Other studies Reviewed: Additional studies/ records that were reviewed today include: Labs. Review of the above records demonstrates:  Please see elsewhere in the note.     ASSESSMENT AND PLAN:  SOB: The patient is no longer having shortness of breath.  He has no significant cardiovascular is factors.  Exam and EKG are unremarkable.  No further workup.  CKD: He has chronic renal insufficiency and is followed by nephrology yearly and apparently this has been stable.  HTN: Blood pressure is at  target.  No change in therapy.  Dyslipidemia: LDL 61 with an HDL of 40.  No change in therapy.   Current medicines are reviewed at length with the patient today.  The patient does not have concerns regarding medicines.  The following changes have been made:  no change  Labs/ tests ordered today include: None No orders of the defined types were placed in this encounter.    Disposition:   FU with with me as needed.     Signed, Lynwood  Jya Hughston, MD  07/21/2023 9:21 AM    [facility]

## 2023-07-21 ENCOUNTER — Ambulatory Visit: Attending: Cardiology | Admitting: Cardiology

## 2023-07-21 ENCOUNTER — Encounter: Payer: Self-pay | Admitting: Cardiology

## 2023-07-21 VITALS — BP 138/89 | HR 57 | Ht 70.0 in | Wt 214.0 lb

## 2023-07-21 DIAGNOSIS — R0602 Shortness of breath: Secondary | ICD-10-CM | POA: Diagnosis not present

## 2023-07-21 NOTE — Patient Instructions (Signed)

## 2023-07-25 ENCOUNTER — Ambulatory Visit: Payer: Medicare Other | Admitting: Nurse Practitioner

## 2023-08-01 ENCOUNTER — Ambulatory Visit: Admitting: Family Medicine

## 2023-09-25 ENCOUNTER — Ambulatory Visit (INDEPENDENT_AMBULATORY_CARE_PROVIDER_SITE_OTHER)

## 2023-09-25 VITALS — BP 124/64 | HR 54 | Temp 98.0°F | Ht 69.5 in | Wt 219.2 lb

## 2023-09-25 DIAGNOSIS — Z0000 Encounter for general adult medical examination without abnormal findings: Secondary | ICD-10-CM

## 2023-09-25 NOTE — Progress Notes (Signed)
 Subjective:   Jeffrey Carey is a 78 y.o. who presents for a Medicare Wellness preventive visit.  As a reminder, Annual Wellness Visits don't include a physical exam, and some assessments may be limited, especially if this visit is performed virtually. We may recommend an in-person follow-up visit with your provider if needed.  Visit Complete: In person    Persons Participating in Visit: Patient.  AWV Questionnaire: No: Patient Medicare AWV questionnaire was not completed prior to this visit.  Cardiac Risk Factors include: advanced age (>46men, >37 women);dyslipidemia;hypertension;male gender;obesity (BMI >30kg/m2)     Objective:    Today's Vitals   09/25/23 1605  BP: 124/64  Pulse: (!) 54  Temp: 98 F (36.7 C)  TempSrc: Oral  SpO2: 96%  Weight: 219 lb 3.2 oz (99.4 kg)  Height: 5' 9.5 (1.765 m)   Body mass index is 31.91 kg/m.     09/25/2023    4:12 PM 07/14/2022    2:19 PM 05/25/2021   12:26 PM 05/04/2020   10:27 AM 04/15/2019   10:46 AM 10/09/2018    9:38 AM 09/14/2017   10:48 AM  Advanced Directives  Does Patient Have a Medical Advance Directive? Yes Yes Yes No Yes Yes Yes   Type of Estate agent of [place];Living will Healthcare Power of [place];Living will Healthcare Power of [place];Living will  Healthcare Power of [place];Living will Healthcare Power of [place];Living will Healthcare Power of [place];Living will  Does patient want to make changes to medical advance directive?  No - Patient declined No - Patient declined  No - Patient declined No - Patient declined No - Patient declined   Copy of Healthcare Power of Attorney in Chart? No - copy requested No - copy requested Yes - validated most recent copy scanned in chart (See row information)  No - copy requested No - copy requested No - copy requested   Would patient like information on creating a medical advance directive?    No - Patient declined   No - Patient declined      Data  saved with a previous flowsheet row definition    Current Medications (verified) Outpatient Encounter Medications as of 09/25/2023  Medication Sig   Acetaminophen  (TYLENOL  PO) Take by mouth. PRN   aspirin 81 MG tablet Take 81 mg by mouth at bedtime.    atenolol  (TENORMIN ) 100 MG tablet TAKE 1 TABLET BY MOUTH EVERY DAY FOR BLOOD PRESSURE   CINNAMON PO Take 1,000 mg by mouth daily.   Flaxseed, Linseed, (FLAXSEED OIL PO) Take 1 capsule by mouth daily.   Fluticasone  Propionate (FLONASE  NA) Place into the nose.   Magnesium  250 MG TABS Take by mouth.   Omega-3 Fatty Acids (FISH OIL PO) Take 1,200 mg by mouth at bedtime.    polyethylene glycol powder (GLYCOLAX /MIRALAX ) 17 GM/SCOOP powder Take 17 g by mouth 2 (two) times daily as needed for mild constipation.   pravastatin  (PRAVACHOL ) 40 MG tablet TAKE 1 TABLET BY MOUTH EVERY DAY AT BEDTIME FOR CHOLESTEROL   VITAMIN D  PO Take 5,000 Units by mouth daily.   Simethicone  125 MG CAPS 125 to 250 mg orally as needed after meals and at bedtime for bloating (Patient not taking: Reported on 09/25/2023)   No facility-administered encounter medications on file as of 09/25/2023.    Allergies (verified) Ceftin [cefuroxime axetil] and Peanut-containing drug products   History: Past Medical History:  Diagnosis Date   Chronic kidney disease    Dyslipidemia    ED (  erectile dysfunction)    GERD (gastroesophageal reflux disease)    History of kidney stones    Hyperlipidemia    Hypertension    Radiculopathy 04/30/2014   Vitamin D  deficiency    Past Surgical History:  Procedure Laterality Date   ANTERIOR CERVICAL DECOMP/DISCECTOMY FUSION N/A 04/30/2014   Procedure: ANTERIOR CERVICAL DECOMPRESSION/DISCECTOMY FUSION 1 LEVEL;  Surgeon: Oneil Priestly, MD;  Location: [facility] OR;  Service: Orthopedics;  Laterality: N/A;  Anterior cervical decompression fusion, cervical 7-thoracic 1 with instrumentation and allograft   COLONOSCOPY     HAMMER TOE SURGERY  2012   left  foot, 3rd toe   LAPAROSCOPIC CHOLECYSTECTOMY  10/2010   UPPER GASTROINTESTINAL ENDOSCOPY     Family History  Problem Relation Age of Onset   Colon cancer Mother 36   Ovarian cancer Mother    Colon cancer Father 42   Diabetes Father    Alzheimer's disease Father    Prostate cancer Father    Stomach cancer Neg Hx    Colon polyps Neg Hx    Esophageal cancer Neg Hx    Rectal cancer Neg Hx    Social History   Socioeconomic History   Marital status: Married    Spouse name: Not on file   Number of children: 1   Years of education: Not on file   Highest education level: Not on file  Occupational History   Not on file  Tobacco Use   Smoking status: Never   Smokeless tobacco: Never  Vaping Use   Vaping status: Never Used  Substance and Sexual Activity   Alcohol use: No   Drug use: No   Sexual activity: Not on file  Other Topics Concern   Not on file  Social History Narrative   Lives with wife.  Was a Psychologist, educational.     Social Drivers of Corporate investment banker Strain: Low Risk  (09/25/2023)   Overall Financial Resource Strain (CARDIA)    Difficulty of Paying Living Expenses: Not hard at all  Food Insecurity: No Food Insecurity (09/25/2023)   Hunger Vital Sign    Worried About Running Out of Food in the Last Year: Never true    Ran Out of Food in the Last Year: Never true  Transportation Needs: No Transportation Needs (09/25/2023)   PRAPARE - Administrator, Civil Service (Medical): No    Lack of Transportation (Non-Medical): No  Physical Activity: Sufficiently Active (09/25/2023)   Exercise Vital Sign    Days of Exercise per Week: 4 days    Minutes of Exercise per Session: 50 min  Stress: No Stress Concern Present (09/25/2023)   [employer] of Occupational Health - Occupational Stress Questionnaire    Feeling of Stress: Not at all  Social Connections: Moderately Isolated (09/25/2023)   Social Connection and Isolation Panel     Frequency of Communication with Friends and Family: More than three times a week    Frequency of Social Gatherings with Friends and Family: Once a week    Attends Religious Services: More than 4 times per year    Active Member of [employer] or Organizations: No    Attends Banker Meetings: Never    Marital Status: Widowed    Tobacco Counseling Counseling given: Not Answered    Clinical Intake:  Pre-visit preparation completed: Yes  Pain : No/denies pain     Nutritional Status: BMI > 30  Obese Nutritional Risks: None Diabetes: No  Lab Results  Component  Value Date   HGBA1C 5.7 (H) 10/17/2022   HGBA1C 5.7 (H) 03/17/2022   HGBA1C 5.4 08/30/2021     How often do you need to have someone help you when you read instructions, pamphlets, or other written materials from your doctor or pharmacy?: 1 - Never  Interpreter Needed?: No  Information entered by :: NAllen LPN   Activities of Daily Living     09/25/2023    4:08 PM 10/17/2022    1:33 AM  In your present state of health, do you have any difficulty performing the following activities:  Hearing? 0 0  Vision? 0 0  Difficulty concentrating or making decisions? 0 0  Walking or climbing stairs? 0 0  Dressing or bathing? 0 0  Doing errands, shopping? 0 0  Preparing Food and eating ? N   Using the Toilet? N   In the past six months, have you accidently leaked urine? N   Do you have problems with loss of bowel control? N   Managing your Medications? N   Managing your Finances? N   Housekeeping or managing your Housekeeping? N     Patient Care Team: Sebastian Beverley NOVAK, MD as PCP - General (Family Medicine) Ottelin, Mark, MD (Inactive) as Consulting Physician (Urology) [facility] (Optometry) Macel, Jayson PARAS, MD as Consulting Physician (Nephrology) Glendia Argyle, DDS (Dentistry)  I have updated your Care Teams any recent Medical Services you may have received from other providers in the past year.      Assessment:   This is a routine wellness examination for Jeffrey Carey.  Hearing/Vision screen Hearing Screening - Comments:: Denies hearing issues Vision Screening - Comments:: Regular eye exams, [facility]   Goals Addressed             This Visit's Progress    Patient Stated       09/25/2023, wants to lose weight       Depression Screen     09/25/2023    4:13 PM 03/17/2023   10:20 AM 10/17/2022    1:34 AM 07/14/2022    2:19 PM 05/25/2021   12:29 PM 02/09/2021   11:14 PM 08/09/2020    1:07 AM  PHQ 2/9 Scores  PHQ - 2 Score 0 0 0 0 1 0 0  PHQ- 9 Score 0 0         Fall Risk     09/25/2023    4:13 PM 03/17/2023   10:20 AM 10/17/2022    1:33 AM 07/14/2022    2:19 PM 05/25/2021   12:29 PM  Fall Risk   Falls in the past year? 0 0  1 0  Number falls in past yr: 0 0  0 0  Injury with Fall? 0 0  0 0  Risk for fall due to : Medication side effect No Fall Risks No Fall Risks History of fall(s) No Fall Risks  Follow up Falls evaluation completed;Falls prevention discussed Falls evaluation completed Falls prevention discussed;Education provided;Falls evaluation completed Falls evaluation completed;Falls prevention discussed Falls prevention discussed;Falls evaluation completed      Data saved with a previous flowsheet row definition    MEDICARE RISK AT HOME:  Medicare Risk at Home Any stairs in or around the home?: No If so, are there any without handrails?: No Home free of loose throw rugs in walkways, pet beds, electrical cords, etc?: Yes Adequate lighting in your home to reduce risk of falls?: Yes Life alert?: No Use of a cane, walker  or w/c?: No Grab bars in the bathroom?: No Shower chair or bench in shower?: Yes Elevated toilet seat or a handicapped toilet?: Yes  TIMED UP AND GO:  Was the test performed?  Yes  Length of time to ambulate 10 feet: 5 sec Gait steady and fast without use of assistive device  Cognitive Function: 6CIT completed        09/25/2023     4:14 PM  6CIT Screen  What Year? 0 points  What month? 0 points  What time? 0 points  Count back from 20 0 points  Months in reverse 2 points  Repeat phrase 4 points  Total Score 6 points    Immunizations Immunization History  Administered Date(s) Administered   INFLUENZA, HIGH DOSE SEASONAL PF 11/07/2016, 10/26/2017, 10/09/2018, 10/16/2019, 10/08/2020, 10/19/2021, 10/17/2022   Influenza Whole 10/10/2012   Influenza-Unspecified 11/04/2014   PFIZER Comirnaty(Gray Top)Covid-19 Tri-Sucrose Vaccine 07/16/2020   PFIZER(Purple Top)SARS-COV-2 Vaccination 02/14/2019, 03/11/2019, 11/16/2019   Pneumococcal Conjugate-13 02/05/2014   Pneumococcal Polysaccharide-23 07/10/2012   Td 03/07/2006   Tdap 05/25/2021    Screening Tests Health Maintenance  Topic Date Due   Zoster Vaccines- Shingrix (1 of 2) Never done   Influenza Vaccine  08/04/2023   COVID-19 Vaccine (5 - 2025-26 season) 09/04/2023   Medicare Annual Wellness (AWV)  09/24/2024   Colonoscopy  07/20/2025   DTaP/Tdap/Td (3 - Td or Tdap) 05/26/2031   Pneumococcal Vaccine: 50+ Years  Completed   Hepatitis C Screening  Completed   HPV VACCINES  Aged Out   Meningococcal B Vaccine  Aged Out    Health Maintenance Items Addressed: Declines shingles and covid vaccine. Getting flu vaccine in October.  Additional Screening:  Vision Screening: Recommended annual ophthalmology exams for early detection of glaucoma and other disorders of the eye. Is the patient up to date with their annual eye exam?  Yes  Who is the provider or what is the name of the office in which the patient attends annual eye exams? [facility]  Dental Screening: Recommended annual dental exams for proper oral hygiene  Community Resource Referral / Chronic Care Management: CRR required this visit?  No   CCM required this visit?  No   Plan:    I have personally reviewed and noted the following in the patient's chart:   Medical and social history Use  of alcohol, tobacco or illicit drugs  Current medications and supplements including opioid prescriptions. Patient is not currently taking opioid prescriptions. Functional ability and status Nutritional status Physical activity Advanced directives List of other physicians Hospitalizations, surgeries, and ER visits in previous 12 months Vitals Screenings to include cognitive, depression, and falls Referrals and appointments  In addition, I have reviewed and discussed with patient certain preventive protocols, quality metrics, and best practice recommendations. A written personalized care plan for preventive services as well as general preventive health recommendations were provided to patient.   Ardella FORBES Dawn, LPN   [id number]   After Visit Summary: (In Person-Printed) AVS printed and given to the patient  Notes: Nothing significant to report at this time.

## 2023-09-25 NOTE — Patient Instructions (Signed)
 [name],  Thank you for taking the time for your Medicare Wellness Visit. I appreciate your continued commitment to your health goals. Please review the care plan we discussed, and feel free to reach out if I can assist you further.  Medicare recommends these wellness visits once per year to help you and your care team stay ahead of potential health issues. These visits are designed to focus on prevention, allowing your provider to concentrate on managing your acute and chronic conditions during your regular appointments.  Please note that Annual Wellness Visits do not include a physical exam. Some assessments may be limited, especially if the visit was conducted virtually. If needed, we may recommend a separate in-person follow-up with your provider.  Ongoing Care Seeing your primary care provider every 3 to 6 months helps us  monitor your health and provide consistent, personalized care.   Referrals If a referral was made during today's visit and you haven't received any updates within two weeks, please contact the referred provider directly to check on the status.  Recommended Screenings:  Health Maintenance  Topic Date Due   Zoster (Shingles) Vaccine (1 of 2) Never done   Flu Shot  08/04/2023   COVID-19 Vaccine (5 - 2025-26 season) 09/04/2023   Medicare Annual Wellness Visit  09/24/2024   Colon Cancer Screening  07/20/2025   DTaP/Tdap/Td vaccine (3 - Td or Tdap) 05/26/2031   Pneumococcal Vaccine for age over 81  Completed   Hepatitis C Screening  Completed   HPV Vaccine  Aged Out   Meningitis B Vaccine  Aged Out       09/25/2023    4:12 PM  Advanced Directives  Does Patient Have a Medical Advance Directive? Yes  Type of Estate agent of [place];Living will  Copy of Healthcare Power of Attorney in Chart? No - copy requested   Advance Care Planning is important because it: Ensures you receive medical care that aligns with your values, goals, and  preferences. Provides guidance to your family and loved ones, reducing the emotional burden of decision-making during critical moments.  Vision: Annual vision screenings are recommended for early detection of glaucoma, cataracts, and diabetic retinopathy. These exams can also reveal signs of chronic conditions such as diabetes and high blood pressure.  Dental: Annual dental screenings help detect early signs of oral cancer, gum disease, and other conditions linked to overall health, including heart disease and diabetes.  Please see the attached documents for additional preventive care recommendations.

## 2023-09-29 ENCOUNTER — Other Ambulatory Visit: Payer: Self-pay | Admitting: Urology

## 2023-10-02 ENCOUNTER — Other Ambulatory Visit: Payer: Self-pay | Admitting: Urology

## 2023-10-03 ENCOUNTER — Ambulatory Visit: Admitting: Family Medicine

## 2023-10-03 ENCOUNTER — Encounter: Payer: Self-pay | Admitting: Family Medicine

## 2023-10-03 ENCOUNTER — Other Ambulatory Visit: Payer: Self-pay | Admitting: Urology

## 2023-10-03 VITALS — BP 120/70 | HR 51 | Temp 97.2°F | Resp 18 | Wt 217.0 lb

## 2023-10-03 DIAGNOSIS — I10 Essential (primary) hypertension: Secondary | ICD-10-CM | POA: Diagnosis not present

## 2023-10-03 DIAGNOSIS — R001 Bradycardia, unspecified: Secondary | ICD-10-CM

## 2023-10-03 DIAGNOSIS — Z23 Encounter for immunization: Secondary | ICD-10-CM | POA: Diagnosis not present

## 2023-10-03 DIAGNOSIS — N200 Calculus of kidney: Secondary | ICD-10-CM | POA: Diagnosis not present

## 2023-10-03 DIAGNOSIS — E782 Mixed hyperlipidemia: Secondary | ICD-10-CM | POA: Diagnosis not present

## 2023-10-03 NOTE — Progress Notes (Signed)
 Assessment & Plan   Assessment/Plan:    Assessment & Plan Renal stone Scheduled for cystoscopy, ureteroscopy, and laser stent placement on October 31, 2023, due to renal stone. Currently asymptomatic with no pain. Urologist advised procedure to prevent potential complications such as obstruction. Initially hesitant but reassured about procedure's necessity and safety. - Proceed with cystoscopy, ureteroscopy, and laser stent placement on October 31, 2023 - Preoperative appointment on October 20, 2023 - Lab work on October 16, 2023  Hypertension Blood pressure well-controlled with home readings averaging 120/70 mmHg. Continues on atenolol  100 mg daily as per cardiology's recommendation. - Continue atenolol  100 mg daily - Monitor blood pressure at home  Bradycardia Heart rate remains at 51 bpm. Evaluated by cardiology on September 15-18, 2025, assessed as low risk with no changes to current management. Asymptomatic with no new symptoms. - Follow up with cardiology as needed - Monitor heart rate  Hyperlipidemia Continues on pravastatin  40 mg for management. - Continue pravastatin  40 mg daily  General Health Maintenance Due for flu shot. - Administer flu shot      There are no discontinued medications.   No follow-ups on file.        Subjective:   Encounter date: 10/03/2023  Jeffrey Carey is a 78 y.o. male who has History of colonic polyps; Essential hypertension; Hyperlipidemia, mixed; Abnormal glucose; Vitamin D  deficiency; Gastroesophageal reflux disease; Obesity (BMI 30.0-34.9); CKD (chronic kidney disease) stage 3, GFR 30-59 ml/min (HCC); History of prediabetes; ED (erectile dysfunction); Grade I diastolic dysfunction; Internal hemorrhoids; Diverticulosis; Chronic idiopathic constipation; Lower abdominal pain; Elevated PSA, less than 10 ng/ml; Bradycardia; SOB (shortness of breath); and Nephrolithiasis on their problem list..   He  has a past medical history of  Chronic kidney disease, Dyslipidemia, ED (erectile dysfunction), GERD (gastroesophageal reflux disease), History of kidney stones, Hyperlipidemia, Hypertension, Radiculopathy (04/30/2014), and Vitamin D  deficiency.Jeffrey Carey   He presents with chief complaint of Hypertension (Pt blood readings at home range from 120/70//HM due- shingles and flu vaccine ), Bradycardia (Pt seen cardiology on 07/18/2023; currently taking atenolol  100MG  ), and urology  (Surgery scheduled for 10/31/2023 (CYSTOSCOPY) due to stones ) .   Discussed the use of AI scribe software for clinical note transcription with the patient, who gave verbal consent to proceed.  History of Present Illness  07/18/2023  Jeffrey Carey is a 78 year old male who presents for a follow-up visit.  He has resolved his previous issues with constipation and abdominal discomfort through increased water intake, consuming about 64 ounces daily, and a diet rich in fiber. He has also been engaging in physical activities such as yard work, which he enjoys.  He has a history of dermatitis for which he was prescribed a cream by a dermatologist, and the condition has cleared up.  He is scheduled to see a cardiologist and has a follow-up with a urologist due to a slightly elevated PSA level of 7. He notes a family history of prostate issues, as his father had similar problems.  He has been on Timolol 100 mg and pravastatin  40 mg for many years. His cholesterol levels were last checked in January, showing an LDL of 61. He is no longer taking Nexium  for heartburn.  No chest pain, shortness of breath, or leg swelling. He has noticed a decrease in appetite and has lost a few pounds, which he attributes to cutting out sweets and increased physical activity. His blood pressure has been stable, and he reports that his pulse rate has  always been on the lower side, though he has not received an explanation for this.  He had an echocardiogram previously, which showed stable  results with no wall motion abnormalities.  10/03/2023    Jeffrey Carey is a 78 year old male with hypertension and bradycardia who presents for follow-up on blood pressure and heart rate management.  Blood pressure and heart rate management - Hypertension and bradycardia managed with atenolol  100 mg daily, aspirin 81 mg daily, and pravastatin  40 mg daily - Home blood pressure readings approximately 120/70 mmHg - Current heart rate is 51 bpm - In mid-September, experienced shortness of breath and bradycardia; evaluated by cardiologist with no changes to cardiovascular regimen - No chest pain, palpitations, or syncope  Renal stone evaluation and urologic procedures - Scheduled for cystoscopy, ureteroscopy, and homeostasis laser stent placement on October 31, 2023, for renal stone management - No current pain or urinary symptoms - No bowel discomfort or pain     ROS  Past Surgical History:  Procedure Laterality Date   ANTERIOR CERVICAL DECOMP/DISCECTOMY FUSION N/A 04/30/2014   Procedure: ANTERIOR CERVICAL DECOMPRESSION/DISCECTOMY FUSION 1 LEVEL;  Surgeon: Oneil Priestly, MD;  Location: [facility] OR;  Service: Orthopedics;  Laterality: N/A;  Anterior cervical decompression fusion, cervical 7-thoracic 1 with instrumentation and allograft   COLONOSCOPY     HAMMER TOE SURGERY  2012   left foot, 3rd toe   LAPAROSCOPIC CHOLECYSTECTOMY  10/2010   UPPER GASTROINTESTINAL ENDOSCOPY      Outpatient Medications Prior to Visit  Medication Sig Dispense Refill   Acetaminophen  (TYLENOL  PO) Take by mouth. PRN     aspirin 81 MG tablet Take 81 mg by mouth at bedtime.      atenolol  (TENORMIN ) 100 MG tablet TAKE 1 TABLET BY MOUTH EVERY DAY FOR BLOOD PRESSURE 90 tablet 3   CINNAMON PO Take 1,000 mg by mouth daily.     Flaxseed, Linseed, (FLAXSEED OIL PO) Take 1 capsule by mouth daily.     Fluticasone  Propionate (FLONASE  NA) Place into the nose.     Magnesium  250 MG TABS Take by mouth.     Omega-3 Fatty  Acids (FISH OIL PO) Take 1,200 mg by mouth at bedtime.      polyethylene glycol powder (GLYCOLAX /MIRALAX ) 17 GM/SCOOP powder Take 17 g by mouth 2 (two) times daily as needed for mild constipation. 500 g 0   pravastatin  (PRAVACHOL ) 40 MG tablet TAKE 1 TABLET BY MOUTH EVERY DAY AT BEDTIME FOR CHOLESTEROL 90 tablet 3   VITAMIN D  PO Take 5,000 Units by mouth daily.     Simethicone  125 MG CAPS 125 to 250 mg orally as needed after meals and at bedtime for bloating (Patient not taking: Reported on 10/03/2023) 28 capsule 0   No facility-administered medications prior to visit.    Family History  Problem Relation Age of Onset   Colon cancer Mother 56   Ovarian cancer Mother    Colon cancer Father 22   Diabetes Father    Alzheimer's disease Father    Prostate cancer Father    Stomach cancer Neg Hx    Colon polyps Neg Hx    Esophageal cancer Neg Hx    Rectal cancer Neg Hx     Social History   Socioeconomic History   Marital status: Married    Spouse name: Not on file   Number of children: 1   Years of education: Not on file   Highest education level: Not on file  Occupational History  Not on file  Tobacco Use   Smoking status: Never   Smokeless tobacco: Never  Vaping Use   Vaping status: Never Used  Substance and Sexual Activity   Alcohol use: No   Drug use: No   Sexual activity: Not on file  Other Topics Concern   Not on file  Social History Narrative   Lives with wife.  Was a Psychologist, educational.     Social Drivers of Corporate investment banker Strain: Low Risk  (09/25/2023)   Overall Financial Resource Strain (CARDIA)    Difficulty of Paying Living Expenses: Not hard at all  Food Insecurity: No Food Insecurity (09/25/2023)   Hunger Vital Sign    Worried About Running Out of Food in the Last Year: Never true    Ran Out of Food in the Last Year: Never true  Transportation Needs: No Transportation Needs (09/25/2023)   PRAPARE - Scientist, research (physical sciences) (Medical): No    Lack of Transportation (Non-Medical): No  Physical Activity: Sufficiently Active (09/25/2023)   Exercise Vital Sign    Days of Exercise per Week: 4 days    Minutes of Exercise per Session: 50 min  Stress: No Stress Concern Present (09/25/2023)   [employer] of Occupational Health - Occupational Stress Questionnaire    Feeling of Stress: Not at all  Social Connections: Moderately Isolated (09/25/2023)   Social Connection and Isolation Panel    Frequency of Communication with Friends and Family: More than three times a week    Frequency of Social Gatherings with Friends and Family: Once a week    Attends Religious Services: More than 4 times per year    Active Member of [employer] or Organizations: No    Attends Banker Meetings: Never    Marital Status: Widowed  Intimate Partner Violence: Not At Risk (09/25/2023)   Humiliation, Afraid, Rape, and Kick questionnaire    Fear of Current or Ex-Partner: No    Emotionally Abused: No    Physically Abused: No    Sexually Abused: No                                                                                                  Objective:  Physical Exam: BP 120/70 Comment: @home  reading  Pulse (!) 51   Temp (!) 97.2 F (36.2 C) (Temporal)   Resp 18   Wt 217 lb (98.4 kg)   SpO2 96%   BMI 31.59 kg/m    Physical Exam VITALS: P- 51 GENERAL: Alert, cooperative, well developed, no acute distress HEENT: Normocephalic, normal oropharynx, moist mucous membranes CHEST: Clear to auscultation bilaterally, no wheezes, rhonchi, or crackles CARDIOVASCULAR: Normal heart rate and rhythm, S1 and S2 normal without murmurs ABDOMEN: Soft, non-tender, non-distended, without organomegaly, normal bowel sounds EXTREMITIES: No cyanosis or edema NEUROLOGICAL: Cranial nerves grossly intact, moves all extremities without gross motor or sensory deficit   VITALS: P- 51 GENERAL: Alert, cooperative, well developed,  no acute distress HEENT: Normocephalic, normal oropharynx, moist mucous membranes CHEST: Clear to auscultation bilaterally, No wheezes, rhonchi, or crackles  CARDIOVASCULAR: Bradycardia with normal heart rhythm, S1 and S2 normal without murmurs ABDOMEN: Soft, non-tender, non-distended, without organomegaly, Normal bowel sounds EXTREMITIES: No cyanosis or edema NEUROLOGICAL: Cranial nerves grossly intact, Moves all extremities without gross motor or sensory deficit   Physical Exam  No results found.  No results found for this or any previous visit (from the past 2160 hours).      Beverley Adine Hummer, MD, MS

## 2023-10-19 NOTE — Progress Notes (Signed)
 COVID Vaccine received:  []  No [x]  Yes Date of any COVID positive Test in last 90 days: None  PCP - Beverley Hummer, MD  Cardiologist - Lynwood Schilling, MD  LOV 07-21-23 Nephrology-  S. Sherra Leyden, MD at [facility]  Chest x-ray - 03-30-2023 2v Epic  EKG -  03-28-2023  Epic Stress Test -  ECHO - 04-14-2023  Epic Cardiac Cath -  CT Coronary Calcium score:   Bowel Prep - [x]  No  []   Yes ______  Pacemaker / ICD device [x]  No []  Yes   Spinal Cord Stimulator:[x]  No []  Yes       History of Sleep Apnea? [x]  No []  Yes   CPAP used?- [x]  No []  Yes    Patient has: []  NO Hx DM   [x]  Pre-DM   []  DM1  []   DM2 Does the patient monitor blood sugar?   []  N/A   [x]  No []  Yes  Last A1c was:   5.7 on    10-17-23   Blood Thinner / Instructions: none Aspirin Instructions:  ASA 81 mg Hold 10-24-23   Dental hx: []  Dentures:  [x]  N/A      []  Bridge or Partial:                   []  Loose or Damaged teeth:   Activity level: Able to walk up 2 flights of stairs without becoming significantly short of breath or having chest pain?  []  No   [x]    Yes  Patient can perform ADLs without assistance. []  No   [x]   Yes  Anesthesia review: ACDF- C7-T1 (2016), Pre-DM, Bradycardia, Grade 1 diastolic dysfunction, HTN, GERD, CKD3  Patient denies any S&S of respiratory illness or Covid - no shortness of breath, fever, cough or chest pain at PAT appointment.  Patient verbalized understanding and agreement to the Pre-Surgical Instructions that were given to them at this PAT appointment. Patient was also educated of the need to review these PAT instructions again prior to his surgery.I reviewed the appropriate phone numbers to call if they have any and questions or concerns.

## 2023-10-19 NOTE — Patient Instructions (Signed)
 SURGICAL WAITING ROOM VISITATION Patients having surgery or a procedure may have no more than 2 support people in the waiting area - these visitors may rotate in the visitor waiting room.   If the patient needs to stay at the hospital during part of their recovery, the visitor guidelines for inpatient rooms apply.  PRE-OP VISITATION  Pre-op nurse will coordinate an appropriate time for 1 support person to accompany the patient in pre-op.  This support person may not rotate.  This visitor will be contacted when the time is appropriate for the visitor to come back in the pre-op area.  Please refer to the [facility] website for the visitor guidelines for Inpatients (after your surgery is over and you are in a regular room).  You are not required to quarantine at this time prior to your surgery. However, you must do this: Hand Hygiene often Do NOT share personal items Notify your provider if you are in close contact with someone who has COVID or you develop fever 100.4 or greater, new onset of sneezing, cough, sore throat, shortness of breath or body aches.  If you test positive for Covid or have been in contact with anyone that has tested positive in the last 10 days please notify you surgeon.    Your procedure is scheduled on:  TUESDAY  October 31, 2023  Report to [facility] Main Entrance: [address] entrance where the [employer] is available.   Report to admitting at:  10:00   AM  Call this number if you have any questions or problems the morning of surgery [phone]  DO NOT EAT OR DRINK ANYTHING AFTER MIDNIGHT THE NIGHT PRIOR TO YOUR SURGERY / PROCEDURE.   FOLLOW  ANY ADDITIONAL PRE OP INSTRUCTIONS YOU RECEIVED FROM YOUR SURGEON'S OFFICE!!!   Oral Hygiene is also important to reduce your risk of infection.        Remember - BRUSH YOUR TEETH THE MORNING OF SURGERY WITH YOUR REGULAR TOOTHPASTE  Do NOT smoke after Midnight the night before surgery.  STOP TAKING all  Vitamins, Herbs and supplements 1 week before your surgery.   Take ONLY these medicines the morning of surgery with A SIP OF WATER: Atenolol , Tylenol  and you may use your Flonase  nasal spray if needed.                     You may not have any metal on your body including  jewelry, and body piercing  Do not wear  lotions, powders, cologne, or deodorant  Men may shave face and neck.  Contacts, Hearing Aids, dentures or bridgework may not be worn into surgery. DENTURES WILL BE REMOVED PRIOR TO SURGERY PLEASE DO NOT APPLY Poly grip OR ADHESIVES!!!  Patients discharged on the day of surgery will not be allowed to drive home.  Someone NEEDS to stay with you for the first 24 hours after anesthesia.  Do not bring your home medications to the hospital. The Pharmacy will dispense medications listed on your medication list to you during your admission in the Hospital.  Please read over the following fact sheets you were given: IF YOU HAVE QUESTIONS ABOUT YOUR PRE-OP INSTRUCTIONS, PLEASE CALL [phone].   [place] - Preparing for Surgery      Before surgery, you can play an important role.  Because skin is not sterile, your skin needs to be as free of germs as possible.  You can reduce the number of germs on your skin by  washing with CHG (chlorahexidine gluconate) soap before surgery.  CHG is an antiseptic cleaner which kills germs and bonds with the skin to continue killing germs even after washing. Please DO NOT use if you have an allergy to CHG or antibacterial soaps.  If your skin becomes reddened/irritated stop using the CHG and inform your nurse when you arrive at Short Stay. Do not shave (including legs and underarms) for at least 48 hours prior to the first CHG shower.  You may shave your face/neck.  Please follow these instructions carefully:  1.  Shower with CHG Soap the night before surgery ONLY (DO NOT USE THE CHG SOAP THE MORNING OF SURGERY).   2.  If you choose to wash your  hair, wash your hair first as usual with your normal  shampoo.  3.  After you shampoo, rinse your hair and body thoroughly to remove the shampoo.                             4.  Use CHG as you would any other liquid soap.  You can apply chg directly to the skin and wash.  Gently with a scrungie or clean washcloth.  5.  Apply the CHG Soap to your body ONLY FROM THE NECK DOWN.   Do not use on face/ open                           Wound or open sores. Avoid contact with eyes, ears mouth and genitals (private parts).                       Wash face,  Genitals (private parts) with your normal soap.             6.  Wash thoroughly, paying special attention to the area where your  surgery  will be performed.  7.  Thoroughly rinse your body with warm water from the neck down.  8.  DO NOT shower/wash with your normal soap after using and rinsing off the CHG Soap.                9.  Pat yourself dry with a clean towel.            10.  Wear clean pajamas.            11.  Place clean sheets on your bed the night of your first shower and do not  sleep with pets.  Day of Surgery : Do not apply any CHG, lotions/deodorants the morning of surgery.  Please wear clean clothes to the hospital/surgery center.   FAILURE TO FOLLOW THESE INSTRUCTIONS MAY RESULT IN THE CANCELLATION OF YOUR SURGERY  PATIENT SIGNATURE_________________________________  NURSE SIGNATURE__________________________________  ________________________________________________________________________

## 2023-10-20 ENCOUNTER — Encounter (HOSPITAL_COMMUNITY)
Admission: RE | Admit: 2023-10-20 | Discharge: 2023-10-20 | Disposition: A | Discharge status: Home / Self Care | Source: Ambulatory Visit | Attending: Urology | Admitting: Urology

## 2023-10-20 ENCOUNTER — Other Ambulatory Visit: Payer: Self-pay

## 2023-10-20 ENCOUNTER — Encounter (HOSPITAL_COMMUNITY): Payer: Self-pay

## 2023-10-20 VITALS — BP 166/78 | HR 52 | Temp 97.9°F | Resp 12 | Ht 69.5 in | Wt 220.0 lb

## 2023-10-20 DIAGNOSIS — I129 Hypertensive chronic kidney disease with stage 1 through stage 4 chronic kidney disease, or unspecified chronic kidney disease: Secondary | ICD-10-CM | POA: Diagnosis not present

## 2023-10-20 DIAGNOSIS — N189 Chronic kidney disease, unspecified: Secondary | ICD-10-CM | POA: Insufficient documentation

## 2023-10-20 DIAGNOSIS — N201 Calculus of ureter: Secondary | ICD-10-CM | POA: Diagnosis not present

## 2023-10-20 DIAGNOSIS — I10 Essential (primary) hypertension: Secondary | ICD-10-CM

## 2023-10-20 DIAGNOSIS — Z01812 Encounter for preprocedural laboratory examination: Secondary | ICD-10-CM | POA: Diagnosis present

## 2023-10-20 DIAGNOSIS — Z01818 Encounter for other preprocedural examination: Secondary | ICD-10-CM

## 2023-10-20 HISTORY — DX: Prediabetes: R73.03

## 2023-10-20 HISTORY — DX: Unspecified osteoarthritis, unspecified site: M19.90

## 2023-10-20 HISTORY — DX: Cardiac arrhythmia, unspecified: I49.9

## 2023-10-20 HISTORY — DX: Other ill-defined heart diseases: I51.89

## 2023-10-20 LAB — CBC
HCT: 55.4 % — ABNORMAL HIGH (ref 39.0–52.0)
Hemoglobin: 18 g/dL — ABNORMAL HIGH (ref 13.0–17.0)
MCH: 29.4 pg (ref 26.0–34.0)
MCHC: 32.5 g/dL (ref 30.0–36.0)
MCV: 90.4 fL (ref 80.0–100.0)
Platelets: 254 K/uL (ref 150–400)
RBC: 6.13 MIL/uL — ABNORMAL HIGH (ref 4.22–5.81)
RDW: 13.6 % (ref 11.5–15.5)
WBC: 8.5 K/uL (ref 4.0–10.5)
nRBC: 0 % (ref 0.0–0.2)

## 2023-10-20 LAB — BASIC METABOLIC PANEL WITH GFR
Anion gap: 11 (ref 5–15)
BUN: 17 mg/dL (ref 8–23)
CO2: 26 mmol/L (ref 22–32)
Calcium: 9.3 mg/dL (ref 8.9–10.3)
Chloride: 102 mmol/L (ref 98–111)
Creatinine, Ser: 1.65 mg/dL — ABNORMAL HIGH (ref 0.61–1.24)
GFR, Estimated: 43 mL/min — ABNORMAL LOW (ref >60)
Glucose, Bld: 96 mg/dL (ref 70–99)
Potassium: 4.7 mmol/L (ref 3.5–5.1)
Sodium: 138 mmol/L (ref 135–145)

## 2023-10-24 NOTE — Progress Notes (Signed)
 Anesthesia Chart Review   Case: [id number] Date/Time: 10/31/23 1200   Procedures:      CYSTOSCOPY/URETEROSCOPY/HOLMIUM LASER/STENT PLACEMENT (Left)     CYSTOSCOPY, WITH RETROGRADE PYELOGRAM (Left)     TURBT (TRANSURETHRAL RESECTION OF BLADDER TUMOR) - MEDIUM   Anesthesia type: General   Diagnosis:      Calculus of ureter [N20.1]     Asymptomatic microscopic hematuria [R31.21]   Pre-op diagnosis: LEFT URETERAL STONE   Location[facility] ROOM 01 / [facility]   Surgeons: Shane Steffan BROCKS, MD       DISCUSSION:78 y.o. never smoker with h/o HTN, CKD, left ureteral stone scheduled for above procedure 10/31/2023 with Dr. Steffan Shane.   ACDF C7-T1 2016.   Pt last seen by cardiology 07/21/2023 for evaluation of shortness of breath. Per notes pt is not having shortness of breath with activity.  He mows the lawn with a push mower without chest pain or shortness of breath. Per note, The patient is no longer having shortness of breath. He has no significant cardiovascular is factors. Exam and EKG are unremarkable. No further workup.   Advised to follow up with cardiology as needed.   Echo 04/14/23 with EF 55-60%, no valvular problems.  VS: BP (!) 166/78 Comment: right arm sitting  Pulse (!) 52   Temp 36.6 C (Oral)   Resp 12   Ht 5' 9.5 (1.765 m)   Wt 99.8 kg   SpO2 99%   BMI 32.02 kg/m   PROVIDERS: Sebastian Beverley NOVAK, MD is PCP   Cardiologist - Lynwood Schilling, MD  LABS: Labs reviewed: Acceptable for surgery. (all labs ordered are listed, but only abnormal results are displayed)  Labs Reviewed  BASIC METABOLIC PANEL WITH GFR - Abnormal; Notable for the following components:      Result Value   Creatinine, Ser 1.65 (*)    GFR, Estimated 43 (*)    All other components within normal limits  CBC - Abnormal; Notable for the following components:   RBC 6.13 (*)    Hemoglobin 18.0 (*)    HCT 55.4 (*)    All other components within normal limits     IMAGES:   EKG:   CV: Echo  04/14/23 1. Left ventricular ejection fraction, by estimation, is 55 to 60%. The  left ventricle has normal function. Left ventricular endocardial border  not optimally defined to evaluate regional wall motion. There is mild left  ventricular hypertrophy of the  basal-septal segment. Left ventricular diastolic parameters are consistent  with Grade I diastolic dysfunction (impaired relaxation).   2. Right ventricular systolic function is normal. The right ventricular  size is normal. Tricuspid regurgitation signal is inadequate for assessing  PA pressure.   3. The mitral valve is normal in structure. No evidence of mitral valve  regurgitation. No evidence of mitral stenosis.   4. The aortic valve is tricuspid. Aortic valve regurgitation is not  visualized. Aortic valve sclerosis/calcification is present, without any  evidence of aortic stenosis. Aortic valve area, by VTI measures 2.19 cm.  Aortic valve mean gradient measures  2.0 mmHg. Aortic valve Vmax measures 1.02 m/s.  Past Medical History:  Diagnosis Date   Arthritis    Chronic kidney disease    Dyslipidemia    Dysrhythmia    Bradycardia   ED (erectile dysfunction)    GERD (gastroesophageal reflux disease)    Grade I diastolic dysfunction    Per ECHO done 04-14-23   History of kidney stones    Hyperlipidemia  Hypertension    Pre-diabetes    Radiculopathy 04/30/2014   Vitamin D  deficiency     Past Surgical History:  Procedure Laterality Date   ANTERIOR CERVICAL DECOMP/DISCECTOMY FUSION N/A 04/30/2014   Procedure: ANTERIOR CERVICAL DECOMPRESSION/DISCECTOMY FUSION 1 LEVEL;  Surgeon: Oneil Priestly, MD;  Location: [facility];  Service: Orthopedics;  Laterality: N/A;  Anterior cervical decompression fusion, cervical 7-thoracic 1 with instrumentation and allograft   COLONOSCOPY     HAMMER TOE SURGERY  2012   left foot, 3rd toe   LAPAROSCOPIC CHOLECYSTECTOMY  10/2010   UPPER GASTROINTESTINAL ENDOSCOPY      MEDICATIONS:   acetaminophen  (TYLENOL ) 325 MG tablet   aspirin 81 MG tablet   atenolol  (TENORMIN ) 100 MG tablet   CINNAMON PO   Flaxseed, Linseed, (FLAXSEED OIL PO)   fluticasone  (FLONASE ) 50 MCG/ACT nasal spray   Magnesium  250 MG TABS   Omega-3 Fatty Acids (FISH OIL PO)   polyethylene glycol powder (GLYCOLAX /MIRALAX ) 17 GM/SCOOP powder   pravastatin  (PRAVACHOL ) 40 MG tablet   Simethicone  125 MG CAPS   VITAMIN D  PO   No current facility-administered medications for this encounter.      Harlene Hoots Ward, PA-C [facility] Pre-Surgical Testing [phone]

## 2023-10-30 NOTE — Anesthesia Preprocedure Evaluation (Signed)
 Anesthesia Evaluation  Patient identified by MRN, date of birth, ID band Patient awake    Reviewed: Allergy & Precautions, NPO status , Patient's Chart, lab work & pertinent test results, reviewed documented beta blocker date and time   History of Anesthesia Complications Negative for: history of anesthetic complications  Airway Mallampati: II  TM Distance: >3 FB Neck ROM: Full    Dental no notable dental hx.    Pulmonary neg pulmonary ROS   Pulmonary exam normal        Cardiovascular hypertension, Pt. on medications and Pt. on home beta blockers Normal cardiovascular exam  TTE 04/14/23: EF 55-60%, mild LVH of basal-septal segment, grade I DD, valves ok    Neuro/Psych S/P ACDF    GI/Hepatic Neg liver ROS,GERD  Controlled,,  Endo/Other  negative endocrine ROS    Renal/GU Renal InsufficiencyRenal disease (Cr 1.65)LEFT URETERAL STONE     Musculoskeletal  (+) Arthritis ,    Abdominal   Peds  Hematology negative hematology ROS (+)   Anesthesia Other Findings   Reproductive/Obstetrics                              Anesthesia Physical Anesthesia Plan  ASA: 2  Anesthesia Plan: General   Post-op Pain Management: Tylenol  PO (pre-op)*   Induction: Intravenous  PONV Risk Score and Plan: 2 and Treatment may vary due to age or medical condition, Ondansetron  and Dexamethasone   Airway Management Planned: LMA  Additional Equipment:   Intra-op Plan:   Post-operative Plan: Extubation in OR  Informed Consent: I have reviewed the patients History and Physical, chart, labs and discussed the procedure including the risks, benefits and alternatives for the proposed anesthesia with the patient or authorized representative who has indicated his/her understanding and acceptance.     Dental advisory given  Plan Discussed with: CRNA  Anesthesia Plan Comments:          Anesthesia Quick  Evaluation

## 2023-10-30 NOTE — H&P (Signed)
 78 year old male referred for elevated PSA. PSA 7.36 on 03/28/2023. Patient has a positive family history of prostate cancer father was treated with brachytherapy. History of BPH symptoms include frequent urination.   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 Wife pased away from breast cancer.   Elevated PSA:  05/03/23: father treated in late 70s, no GYN cancer, no GH, wt loss, no changes in appetite. UA shows no blood.  08/02/23: PSA 5.57 % free is 22, PSA decreased from 7.36. patient states that he would not want radiation if diagnosed with prostate cancer.   BPH:  05/03/23: PVR 39, nocturia 3x a night, drinks a lot of water.  08/01/33: started on flomax last visit, no improvement on flomax, stopped drinking before bed time. STOP-BANG scroe 4 not interested in OSA eval.   History of nephrolithiasis:  05/03/23: Ask if been treated before. Never had surgery seen on KUB 2014 last seen by AUS. No Flank pain blood in urine.  08/01/33: needs repeat KUB shows L renal pelvis stone and R upper pole stone  09/26/2023: Left ureteral stone on CT discussed URS today. Discussed with patient we will proceed with left ureteroscopy with laser lithotripsy.  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  09/25/2023: CT shows left ureteral stone will discuss potentially taking ureteral stone evaluation for tumor/source of microscopic hematuria at time of ureteroscopy. Since going for ureteroscopy will evaluate for microscopic hematuria during ureteroscopy.     ALLERGIES: No Allergies    MEDICATIONS: Atenolol  100 MG Tablet Oral  Pravastatin  Sodium 40 MG Tablet Oral     GU PSH: No GU PSH      PSH Notes: Gallbladder Surgery, Foot Surgery   NON-GU PSH: Visit Complexity (formerly GPC1X) - 08/02/2023, 05/03/2023         GU PMH: Ureteral calculus - 09/20/2023 Microscopic hematuria - 08/22/2023,  - 08/02/2023 BPH w/LUTS - 08/02/2023, - 05/03/2023 Elevated PSA - 08/02/2023, - 05/03/2023 History of urolithiasis - 08/02/2023, - 05/03/2023 Nocturia - 08/02/2023, - 05/03/2023 ED due to arterial insufficiency, Erectile dysfunction due to arterial insufficiency - 2014 Renal calculus, Kidney stone on right side - 2014 Renal cyst, Renal cyst, acquired, left - 2014      PMH Notes:  1898-01-03 00:00:00 - Note: Normal Routine History And Physical Adult   NON-GU PMH: Personal history of other diseases of the circulatory system, History of hypertension - 2014 Personal history of other endocrine, nutritional and metabolic disease, History of hypercholesterolemia - 2014 Hypercholesterolemia Hypertension    FAMILY HISTORY: Prostate Cancer - Father, Runs in Family   SOCIAL HISTORY: Marital Status: Widowed Ethnicity: Not Hispanic Or Latino; Race: White Current Smoking Status: Patient has never smoked.  <DIV'  Tobacco Use Assessment Completed:  Used Tobacco in last 30 days? Has never drank.  Does not use drugs. Drinks 2 caffeinated drinks per day. </DIV'    Notes: Marital History - Currently Married, Caffeine Use, Never A Smoker, Retired From Work, Alcohol Use   REVIEW OF SYSTEMS:     GU Review Male:  Patient denies frequent urination, hard to postpone urination, burning/ pain with urination, get up at night to urinate, leakage of urine, stream starts and stops, trouble starting your stream, have to strain to urinate , erection problems, and penile pain.    Gastrointestinal (Upper):  Patient denies nausea, vomiting, and indigestion/ heartburn.  Gastrointestinal (Lower):  Patient denies diarrhea and constipation.    Constitutional:  Patient denies fever, night sweats, weight loss, and fatigue.    Skin:  Patient denies skin rash/ lesion and itching.    Eyes:  Patient denies blurred vision and double vision.    Ears/ Nose/ Throat:  Patient denies sore throat and sinus problems.     Hematologic/Lymphatic:  Patient denies swollen glands and easy bruising.    Cardiovascular:  Patient denies leg swelling and chest pains.    Respiratory:  Patient denies cough and shortness of breath.    Endocrine:  Patient denies excessive thirst.    Musculoskeletal:  Patient denies back pain and joint pain.    Neurological:  Patient denies headaches and dizziness.    Psychologic:  Patient denies depression and anxiety.    VITAL SIGNS: None     MULTI-SYSTEM PHYSICAL EXAMINATION:      Constitutional: Well-nourished. No physical deformities. Normally developed. Good grooming.     Respiratory: No labored breathing, no use of accessory muscles.      Cardiovascular: Normal temperature, normal extremity pulses, no swelling, no varicosities.            Complexity of Data:   Source Of History:  Patient  Records Review:  Previous Patient Records    07/26/23  PSA  Total PSA 5.57 ng/mL  Free PSA 1.22 ng/mL  % Free PSA 22 % PSA    PROCEDURES:    Visit Complexity - G2211      Urinalysis w/Scope - 81001  Dipstick Dipstick Cont'd Micro  Color: Yellow Bilirubin: Neg WBC/hpf: 0 - 5/hpf  Appearance: Clear Ketones: Neg RBC/hpf: 3 - 10/hpf  Specific Gravity: 1.025 Blood: 3+ Bacteria: Few (10-25/hpf)  pH: <=5.0 Protein: Trace Cystals: NS (Not Seen)  Glucose: Neg Urobilinogen: 0.2 Casts: NS (Not Seen)   Nitrites: Neg Trichomonas: Not Present   Leukocyte Esterase: Neg Mucous: Present    Epithelial Cells: NS (Not Seen)    Yeast: NS (Not Seen)    Sperm: Not Present   Notes:       ASSESSMENT:     ICD-10 Details  1 GU:  BPH w/LUTS - N40.1   2  Ureteral calculus - N20.1 Chronic, Stable  3  Microscopic hematuria - R31.21 Chronic, Stable  4  Nocturia - R35.1    PLAN:   Document  Letter(s):  Created for Patient: Clinical Summary   Notes:  Left ureteral stone: Bethena is persistent for several months will plan for left ureteroscopy with laser lithotripsy this allows us  to evaluate for  bladder malignancies at the same time. As well as allows us  to address the proximal and distal stone. Urine culture ordered today. Also discussed the need for possible TURBT if bladder tumors found.   We discussed the risk benefits and alternatives to ureteroscopy. This includes bleeding, infection, damage to surrounding structures including the urethra, bladder, ureter, and kidney. With these possible injuries resulting in need for intervention in the future. We discussed inability to remove all the stone and requiring follow-up ureteroscopy. We also discussed the possibility of not being able to gain access to the kidney and the need for nephrostomy tube. Possibility of long-term stent was also discussed. The patient voiced their understanding and would like to proceed.   We discussed risk benefits alternatives to transurethral resection of bladder tumor. Benefits are diagnostic and therapeutic which include removal of bladder tumor and relieve the possible irritative symptoms from bladder tumor. We discussed risk including not  being able to remove all of the tumor, possibility of perforation of the bladder requiring long-term catheter or operative intervention to repair. The need for catheter postoperatively was also discussed, as well as postoperative lower urinary tract symptoms in the immediate postop period. Patient voiced their understanding and would like to proceed with the surgery.    Plan proceed with L URS today. Ucx negative  CC: Beverley Pam, MD

## 2023-10-31 ENCOUNTER — Ambulatory Visit (HOSPITAL_COMMUNITY): Payer: Self-pay | Admitting: Physician Assistant

## 2023-10-31 ENCOUNTER — Encounter (HOSPITAL_COMMUNITY)
Admission: RE | Disposition: A | Discharge status: Home / Self Care | Payer: Self-pay | Source: Ambulatory Visit | Attending: Urology

## 2023-10-31 ENCOUNTER — Ambulatory Visit (HOSPITAL_COMMUNITY)

## 2023-10-31 ENCOUNTER — Ambulatory Visit (HOSPITAL_COMMUNITY)
Admission: RE | Admit: 2023-10-31 | Discharge: 2023-10-31 | Disposition: A | Discharge status: Home / Self Care | Source: Ambulatory Visit | Attending: Urology | Admitting: Urology

## 2023-10-31 ENCOUNTER — Other Ambulatory Visit: Payer: Self-pay | Admitting: Urology

## 2023-10-31 ENCOUNTER — Encounter (HOSPITAL_COMMUNITY): Payer: Self-pay | Admitting: Urology

## 2023-10-31 ENCOUNTER — Ambulatory Visit (HOSPITAL_COMMUNITY): Admitting: Anesthesiology

## 2023-10-31 DIAGNOSIS — Z6832 Body mass index (BMI) 32.0-32.9, adult: Secondary | ICD-10-CM | POA: Insufficient documentation

## 2023-10-31 DIAGNOSIS — E785 Hyperlipidemia, unspecified: Secondary | ICD-10-CM | POA: Diagnosis not present

## 2023-10-31 DIAGNOSIS — N201 Calculus of ureter: Secondary | ICD-10-CM | POA: Insufficient documentation

## 2023-10-31 DIAGNOSIS — N401 Enlarged prostate with lower urinary tract symptoms: Secondary | ICD-10-CM | POA: Insufficient documentation

## 2023-10-31 DIAGNOSIS — Z79899 Other long term (current) drug therapy: Secondary | ICD-10-CM | POA: Insufficient documentation

## 2023-10-31 DIAGNOSIS — N183 Chronic kidney disease, stage 3 unspecified: Secondary | ICD-10-CM | POA: Insufficient documentation

## 2023-10-31 DIAGNOSIS — E669 Obesity, unspecified: Secondary | ICD-10-CM | POA: Insufficient documentation

## 2023-10-31 DIAGNOSIS — Z8042 Family history of malignant neoplasm of prostate: Secondary | ICD-10-CM | POA: Diagnosis not present

## 2023-10-31 DIAGNOSIS — Z981 Arthrodesis status: Secondary | ICD-10-CM | POA: Diagnosis not present

## 2023-10-31 DIAGNOSIS — I129 Hypertensive chronic kidney disease with stage 1 through stage 4 chronic kidney disease, or unspecified chronic kidney disease: Secondary | ICD-10-CM | POA: Diagnosis not present

## 2023-10-31 DIAGNOSIS — Z5309 Procedure and treatment not carried out because of other contraindication: Secondary | ICD-10-CM | POA: Insufficient documentation

## 2023-10-31 DIAGNOSIS — R351 Nocturia: Secondary | ICD-10-CM | POA: Diagnosis not present

## 2023-10-31 DIAGNOSIS — N135 Crossing vessel and stricture of ureter without hydronephrosis: Secondary | ICD-10-CM | POA: Insufficient documentation

## 2023-10-31 HISTORY — PX: CYSTOSCOPY W/ RETROGRADES: SHX1426

## 2023-10-31 HISTORY — PX: CYSTOSCOPY/URETEROSCOPY/HOLMIUM LASER/STENT PLACEMENT: SHX6546

## 2023-10-31 SURGERY — CYSTOSCOPY/URETEROSCOPY/HOLMIUM LASER/STENT PLACEMENT
Anesthesia: General | Laterality: Left

## 2023-10-31 MED ORDER — METHOCARBAMOL 750 MG PO TABS: 750.0000 mg | ORAL_TABLET | Freq: Four times a day (QID) | ORAL | 0 refills | Status: AC

## 2023-10-31 MED ORDER — FENTANYL CITRATE (PF) 50 MCG/ML IJ SOSY: 25.0000 ug | PREFILLED_SYRINGE | INTRAMUSCULAR | Status: DC | PRN

## 2023-10-31 MED ORDER — CHLORHEXIDINE GLUCONATE 0.12 % MT SOLN
15.0000 mL | Freq: Once | OROMUCOSAL | Status: AC
  Administered 2023-10-31: 15 mL via OROMUCOSAL

## 2023-10-31 MED ORDER — LIDOCAINE 2% (20 MG/ML) 5 ML SYRINGE
INTRAMUSCULAR | Status: DC | PRN
  Administered 2023-10-31: 100 mg via INTRAVENOUS

## 2023-10-31 MED ORDER — EPHEDRINE 5 MG/ML INJ
INTRAVENOUS | Status: AC
  Filled 2023-10-31: qty 5

## 2023-10-31 MED ORDER — SODIUM CHLORIDE 0.9 % IR SOLN
Status: DC | PRN
  Administered 2023-10-31: 3000 mL

## 2023-10-31 MED ORDER — TAMSULOSIN HCL 0.4 MG PO CAPS: 0.4000 mg | ORAL_CAPSULE | Freq: Every day | ORAL | 0 refills | Status: AC

## 2023-10-31 MED ORDER — PROPOFOL 10 MG/ML IV BOLUS
INTRAVENOUS | Status: DC | PRN
  Administered 2023-10-31: 160 mg via INTRAVENOUS

## 2023-10-31 MED ORDER — CIPROFLOXACIN IN D5W 400 MG/200ML IV SOLN
400.0000 mg | INTRAVENOUS | Status: AC
  Administered 2023-10-31: 400 mg via INTRAVENOUS
  Filled 2023-10-31: qty 200

## 2023-10-31 MED ORDER — EPHEDRINE SULFATE-NACL 50-0.9 MG/10ML-% IV SOSY
PREFILLED_SYRINGE | INTRAVENOUS | Status: DC | PRN
  Administered 2023-10-31 (×2): 10 mg via INTRAVENOUS

## 2023-10-31 MED ORDER — TRAMADOL HCL 50 MG PO TABS: 50.0000 mg | ORAL_TABLET | Freq: Four times a day (QID) | ORAL | 0 refills | Status: AC | PRN

## 2023-10-31 MED ORDER — IOHEXOL 300 MG/ML  SOLN
INTRAMUSCULAR | Status: DC | PRN
  Administered 2023-10-31: 10 mL

## 2023-10-31 MED ORDER — LACTATED RINGERS IV SOLN: INTRAVENOUS | Status: DC

## 2023-10-31 MED ORDER — ONDANSETRON HCL 4 MG/2ML IJ SOLN
INTRAMUSCULAR | Status: DC | PRN
  Administered 2023-10-31: 4 mg via INTRAVENOUS

## 2023-10-31 MED ORDER — ORAL CARE MOUTH RINSE: 15.0000 mL | Freq: Once | OROMUCOSAL | Status: AC

## 2023-10-31 MED ORDER — FENTANYL CITRATE (PF) 100 MCG/2ML IJ SOLN
INTRAMUSCULAR | Status: DC | PRN
  Administered 2023-10-31: 25 ug via INTRAVENOUS
  Administered 2023-10-31: 50 ug via INTRAVENOUS
  Administered 2023-10-31: 25 ug via INTRAVENOUS

## 2023-10-31 MED ORDER — OXYCODONE HCL 5 MG/5ML PO SOLN: 5.0000 mg | Freq: Once | ORAL | Status: DC | PRN

## 2023-10-31 MED ORDER — FENTANYL CITRATE (PF) 100 MCG/2ML IJ SOLN
INTRAMUSCULAR | Status: AC
  Filled 2023-10-31: qty 2

## 2023-10-31 MED ORDER — ACETAMINOPHEN 500 MG PO TABS
1000.0000 mg | ORAL_TABLET | Freq: Once | ORAL | Status: AC
  Administered 2023-10-31: 1000 mg via ORAL
  Filled 2023-10-31: qty 2

## 2023-10-31 MED ORDER — OXYCODONE HCL 5 MG PO TABS: 5.0000 mg | ORAL_TABLET | Freq: Once | ORAL | Status: DC | PRN

## 2023-10-31 MED ORDER — DROPERIDOL 2.5 MG/ML IJ SOLN: 0.6250 mg | Freq: Once | INTRAMUSCULAR | Status: DC | PRN

## 2023-10-31 MED ORDER — DEXAMETHASONE SOD PHOSPHATE PF 10 MG/ML IJ SOLN
INTRAMUSCULAR | Status: DC | PRN
  Administered 2023-10-31: 4 mg via INTRAVENOUS

## 2023-10-31 SURGICAL SUPPLY — 28 items
BAG COUNTER SPONGE SURGICOUNT (BAG) IMPLANT
BAG URO CATCHER STRL LF (MISCELLANEOUS) ×3 IMPLANT
BASKET ZERO TIP NITINOL 2.4FR (BASKET) IMPLANT
CATH URETERAL DUAL LUMEN 10F (MISCELLANEOUS) IMPLANT
CATH URETL OPEN 5X70 (CATHETERS) ×1 IMPLANT
CLOTH BEACON ORANGE TIMEOUT ST (SAFETY) ×3 IMPLANT
DRAPE FOOT SWITCH (DRAPES) ×3 IMPLANT
ELECT REM PT RETURN 15FT ADLT (MISCELLANEOUS) ×3 IMPLANT
EXTRACTOR STONE 1.7FRX115CM (UROLOGICAL SUPPLIES) IMPLANT
FIBER LASER MOSES 200 DFL (Laser) IMPLANT
FIBER LASER MOSES 365 DFL (Laser) IMPLANT
GLOVE BIO SURGEON STRL SZ8 (GLOVE) ×3 IMPLANT
GOWN STRL REUS W/ TWL XL LVL3 (GOWN DISPOSABLE) ×3 IMPLANT
GUIDEWIRE ANG ZIPWIRE 038X150 (WIRE) IMPLANT
GUIDEWIRE STR DUAL SENSOR (WIRE) ×4 IMPLANT
KIT TURNOVER KIT A (KITS) ×3 IMPLANT
LOOP CUT BIPOLAR 24F LRG (ELECTROSURGICAL) IMPLANT
MANIFOLD NEPTUNE II (INSTRUMENTS) ×3 IMPLANT
NDL SAFETY ECLIPSE 18X1.5 (NEEDLE) ×3 IMPLANT
NS IRRIG 1000ML POUR BTL (IV SOLUTION) IMPLANT
PACK CYSTO (CUSTOM PROCEDURE TRAY) ×3 IMPLANT
SHEATH NAVIGATOR HD 11/13X28 (SHEATH) IMPLANT
SHEATH NAVIGATOR HD 11/13X36 (SHEATH) ×3 IMPLANT
STENT PERCUFLEX 4.8FRX24 (STENTS) ×1 IMPLANT
SYRINGE TOOMEY IRRIG 70ML (MISCELLANEOUS) IMPLANT
TRACTIP FLEXIVA PULS ID 200XHI (Laser) IMPLANT
TUBING CONNECTING 10 (TUBING) ×3 IMPLANT
TUBING UROLOGY SET (TUBING) ×3 IMPLANT

## 2023-10-31 NOTE — Anesthesia Postprocedure Evaluation (Signed)
 Anesthesia Post Note  Patient: ABDUR Carey  Procedure(s) Performed: CYSTOSCOPY/URETEROSCOPY, STENT PLACEMENT (Left) CYSTOSCOPY, WITH RETROGRADE PYELOGRAM (Left)     Patient location during evaluation: PACU Anesthesia Type: General Level of consciousness: awake and alert Pain management: pain level controlled Vital Signs Assessment: post-procedure vital signs reviewed and stable Respiratory status: spontaneous breathing, nonlabored ventilation and respiratory function stable Cardiovascular status: blood pressure returned to baseline Postop Assessment: no apparent nausea or vomiting Anesthetic complications: no   No notable events documented.  Last Vitals:  Vitals:   10/31/23 1300 10/31/23 1315  BP: 132/70 131/70  Pulse: 65 63  Resp: 11 (!) 22  Temp:    SpO2: 100% 97%    Last Pain:  Vitals:   10/31/23 1315  TempSrc:   PainSc: 0-No pain                 Vertell Row

## 2023-10-31 NOTE — Anesthesia Procedure Notes (Signed)
 Procedure Name: LMA Insertion Date/Time: 10/31/2023 12:20 PM  Performed by: Vincenzo Show, CRNAPre-anesthesia Checklist: Patient identified, Emergency Drugs available, Suction available, Patient being monitored and Timeout performed Patient Re-evaluated:Patient Re-evaluated prior to induction Oxygen Delivery Method: Circle system utilized Preoxygenation: Pre-oxygenation with 100% oxygen Induction Type: IV induction Ventilation: Mask ventilation without difficulty LMA: LMA inserted LMA Size: 4.0 Tube size: 4.0 mm Number of attempts: 1 Placement Confirmation: positive ETCO2 and breath sounds checked- equal and bilateral Tube secured with: Tape Dental Injury: Teeth and Oropharynx as per pre-operative assessment  Comments: Easy pass of LMA.

## 2023-10-31 NOTE — Discharge Instructions (Addendum)
 DISCHARGE INSTRUCTIONS FOR Ureteroscopy and/or Ureteral Stent Placement   During the surgery your ureter was very narrow. My instruments were unable to access the ureter due to itr being too narrow. I have placed a stent which will dilate the ureter and we will plan to go back to the OR for a repeat ureteroscopy.    MEDICATIONS:  1.  Robaxin 2. Tamsulosin  3. Pyridium  ACTIVITY:  1. No strenuous activity x 1week  2. No driving while on narcotic pain medications  3. Drink plenty of water  4. Continue to walk at home - it is normal to see blood in the urine while the stent is in place, so keep active, but don't over do it.  5. May return to work/school tomorrow or when you feel ready  6. You may experience some pain when urinating in the kidney on the side that was operated on while the stent is in place this is normal  WHAT IS NORMAL TO EXPERIENCE: It is normal to feel the urge to urinate while the stent is in place It is normal to have blood in your urine while the stent is in place  It sometimes can be normal to have pain in your kidney when you urinate   BATHING:  1. You can shower and we recommend daily showers    DIET: You may return to your normal diet immediately. Because of the raw surface of your bladder, alcohol, spicy foods, acid type foods and drinks with caffeine may cause irritation or frequency and should be used in moderation. To keep your urine flowing freely and to avoid constipation, drink plenty of fluids during the day ( 8-10 glasses ). Tip: Avoid cranberry juice because it is very acidic.  SIGNS/SYMPTOMS TO CALL:  Please call us  if you have a fever greater than 101.5, uncontrolled nausea/vomiting, uncontrolled pain, dizziness, unable to urinate, bloody urine with clots greater than the size of a quarter, chest pain, shortness of breath, leg swelling, leg pain, redness around wound, drainage from wound, or any other concerns or questions.   You can reach us  at  [phone].   FOLLOW-UP:  1. You will be called to set up repeat Ureteroscopy in the next 2-3 weeks

## 2023-10-31 NOTE — Transfer of Care (Signed)
 Immediate Anesthesia Transfer of Care Note  Patient: Jeffrey Carey  Procedure(s) Performed: Procedure(s): CYSTOSCOPY/URETEROSCOPY, STENT PLACEMENT (Left) CYSTOSCOPY, WITH RETROGRADE PYELOGRAM (Left)  Patient Location: PACU  Anesthesia Type:General  Level of Consciousness:  sedated, patient cooperative and responds to stimulation  Airway & Oxygen Therapy:Patient Spontanous Breathing and Patient connected to face mask oxgen  Post-op Assessment:  Report given to PACU RN and Post -op Vital signs reviewed and stable  Post vital signs:  Reviewed and stable  Last Vitals:  Vitals:   10/31/23 1025  BP: (!) 172/90  Pulse: (!) 55  Resp: 18  Temp: 37 C  SpO2: 97%    Complications: No apparent anesthesia complications

## 2023-10-31 NOTE — Op Note (Signed)
 Preoperative diagnosis: left obstructing ureteral stone Postoperative diagnosis: Same  Procedures performed:  Cystoscopy Left retrograde pyelogram Diagnostic ureteroscopy  Surgeon: Dr. Steffan Pea  Findings:  Narrow left distal ureter only able to advance 2 to 3 cm in the ureter due to significant narrowing unable to pass ureteroscope to level of stone  4.8 x 24 cm stent placed without strings No abnormalities noted in the bladder Very large prostate with large median lobe  Left retrograde pyelogram interpretation: Significantly narrowed left distal ureter with filling defect noted with a likely location of the stone is.  Specimens: None  Indication: Jeffrey Carey is a 78 y.o. patient with long history of asymptomatic left distal ureteral stone he is here today for left uteroscopy laser lithotripsy possible attempt at removing the renal pelvis stone. After reviewing the management options for treatment, he elected to proceed with the above surgical procedure(s). We have discussed the potential benefits and risks of the procedure, side effects of the proposed treatment, the likelihood of the patient achieving the goals of the procedure, and any potential problems that might occur during the procedure or recuperation. Informed consent has been obtained.  Procedure in detail: Patient was consented prior to being brought back to the operating room. He was then brought back to the operating room placed on the table in supine position. General anesthesia was then induced and endotracheal tube inserted. This then placed in dorsolithotomy position and prepped and draped in the routine sterile fashion. A timeout was held.  Using a 22.5 French cystoscope with a 30  lens, I gently passed the scope into the patient's urethra and into the bladder under visual guidance.  The prostate was noted to be very large with a large significant median lobe identifying the left ureter was difficult due to  the significant size of the median lobe.  A 360 cystoscopic evaluation was performed with no mucosal abnormalities, no tumors, and no foreign bodies identified..  Large median volume difficult to identify trigone.  And placed a 5 French ureteral access sheath at the left ureteral orifice.  I then passed a 0. 038 Sensor wire into the left ureteral orifice and into the left renal pelvis.   Once wire was then placed a 4.5 French semirigid ureteroscope was then passed into the distal ureter there was difficulty getting access to the ureter so the second wire was then placed through the semirigid ureteroscope to attempt to train track method we advanced slightly up the distal ureter but noted significant narrowing there is no tumors or other abnormalities but due to the significant narrowing and difficulty with access in the ureter there was concern that there could be injury the decision was to abort place stent and plan for repeat ureteroscopy in the near future.  Reviewing the semirigid ureteroscope and wire leaving the safety wire remaining this is close retention introduced in the bladder 5 French ureteral access sheath was placed adjacent to the wire retrograde pyelogram performed with the findings noted above.  Cystoscope was then removed the wire was backloaded on the cystoscope and a 4.8 x 24 double-J stent was then placed under fluoroscopic guidance over the wire with the cystoscope proximally in the bladder and the kidney were confirmed with fluoroscopy.   A final fluoroscopic image was obtained confirming the curl in the renal pelvis as well as a curl in the bladder.   Disposition: The patient returned to the PACU in stable condition. Patient will plan for repeat uroscopy in the near  future.

## 2023-11-01 ENCOUNTER — Encounter (HOSPITAL_COMMUNITY): Payer: Self-pay | Admitting: Urology

## 2023-11-09 ENCOUNTER — Other Ambulatory Visit: Payer: Self-pay | Admitting: Urology

## 2023-11-09 NOTE — Progress Notes (Signed)
 Left a message with Asberry, surgery scheduler for Dr. Shane, regarding misspelling on consent for surgery on 11-16-23.

## 2023-11-09 NOTE — Progress Notes (Signed)
 Patient phoned to give updated information on surgery.  Date of Surgery - 11-16-23  Arrival Time - 8:00 and check in at admitting.    NPO Status - patient reminded to not eat solid food or drink liquids after midnight.    Medications morning of surgery - Atenolol .  Okay to use Flonase  nasal spray.  If needed Tylenol , Tramadol  No change in medical history, allergies per patient.  Transportation home -Clotilda Reichert [phone]  All questions answered and patient stated understanding

## 2023-11-14 NOTE — H&P (Signed)
 H&P   Chief Complaint: Ureteral stone   History of Present Illness:   78 year old male referred for elevated PSA. PSA 7.36 on 03/28/2023. Patient has a positive family history of prostate cancer father was treated with brachytherapy. History of BPH symptoms include frequent urination.   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 Wife pased away from breast cancer.   Elevated PSA:  05/03/23: father treated in late 70s, no GYN cancer, no GH, wt loss, no changes in appetite. UA shows no blood.  08/02/23: PSA 5.57 % free is 22, PSA decreased from 7.36. patient states that he would not want radiation if diagnosed with prostate cancer.   BPH:  05/03/23: PVR 39, nocturia 3x a night, drinks a lot of water.  08/01/33: started on flomax last visit, no improvement on flomax, stopped drinking before bed time. STOP-BANG scroe 4 not interested in OSA eval.   History of nephrolithiasis:  05/03/23: Ask if been treated before. Never had surgery seen on KUB 2014 last seen by AUS. No Flank pain blood in urine.  08/01/33: needs repeat KUB shows L renal pelvis stone and R upper pole stone  09/26/2023: Left ureteral stone on CT discussed URS today. Discussed with patient we will proceed with left ureteroscopy with laser lithotripsy.  10/31/23: L URS today  11/16/23: attmepoted URS ureter to narrow stent placed on 10/31/23. 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 Since going for ureteroscopy will evaluate for microscopic hematuria during ureteroscopy.    Past Medical History:  Diagnosis Date   Arthritis    Chronic kidney disease    Dyslipidemia    Dysrhythmia    Bradycardia   ED (erectile dysfunction)    GERD  (gastroesophageal reflux disease)    Grade I diastolic dysfunction    Per ECHO done 04-14-23   History of kidney stones    Hyperlipidemia    Hypertension    Pre-diabetes    Radiculopathy 04/30/2014   Vitamin D  deficiency    Past Surgical History:  Procedure Laterality Date   ANTERIOR CERVICAL DECOMP/DISCECTOMY FUSION N/A 04/30/2014   Procedure: ANTERIOR CERVICAL DECOMPRESSION/DISCECTOMY FUSION 1 LEVEL;  Surgeon: Oneil Priestly, MD;  Location: [facility] OR;  Service: Orthopedics;  Laterality: N/A;  Anterior cervical decompression fusion, cervical 7-thoracic 1 with instrumentation and allograft   COLONOSCOPY     CYSTOSCOPY W/ RETROGRADES Left 10/31/2023   Procedure: CYSTOSCOPY, WITH RETROGRADE PYELOGRAM;  Surgeon: Shane Steffan BROCKS, MD;  Location: [facility];  Service: Urology;  Laterality: Left;   CYSTOSCOPY/URETEROSCOPY/HOLMIUM LASER/STENT PLACEMENT Left 10/31/2023   Procedure: CYSTOSCOPY/URETEROSCOPY, STENT PLACEMENT;  Surgeon: Shane Steffan BROCKS, MD;  Location: [facility];  Service: Urology;  Laterality: Left;   HAMMER TOE SURGERY  2012   left foot, 3rd toe   LAPAROSCOPIC CHOLECYSTECTOMY  10/2010   UPPER GASTROINTESTINAL ENDOSCOPY      Home Medications:  No medications prior to admission.   Allergies:  Allergies  Allergen Reactions   Ceftin [Cefuroxime Axetil] Hives   Peanut-Containing Drug Products Other (See Comments)    Break out on nose     Family History  Problem Relation Age of Onset   Colon cancer Mother 73   Ovarian cancer Mother    Colon cancer Father 52  Diabetes Father    Alzheimer's disease Father    Prostate cancer Father    Stomach cancer Neg Hx    Colon polyps Neg Hx    Esophageal cancer Neg Hx    Rectal cancer Neg Hx    Social History:  reports that he has never smoked. He has never used smokeless tobacco. He reports that he does not drink alcohol and does not use drugs.  ROS: A complete review of systems was performed.  All systems are negative except for  pertinent findings as noted. ROS   Physical Exam:  Vital signs in last 24 hours:   General: NAD Respiratory: normal WOB on RA Cards: RRR per monitor   Laboratory Data:  No results found for this or any previous visit (from the past 24 hours). No results found for this or any previous visit (from the past 240 hours). Creatinine: No results for input(s): CREATININE in the last 168 hours.  Impression/Assessment:   es:  Left ureteral stone: Bethena is persistent for several months will plan for left ureteroscopy with laser lithotripsy this allows us  to evaluate for bladder malignancies at the same time. As well as allows us  to address the proximal and distal stone. Unable to access L ureter last visit stent placed plan for L URS w/ LL today.   We discussed the risk benefits and alternatives to ureteroscopy. This includes bleeding, infection, damage to surrounding structures including the urethra, bladder, ureter, and kidney. With these possible injuries resulting in need for intervention in the future. We discussed inability to remove all the stone and requiring follow-up ureteroscopy. We also discussed the possibility of not being able to gain access to the kidney and the need for nephrostomy tube. Possibility of long-term stent was also discussed. The patient voiced their understanding and would like to proceed.   We discussed risk benefits alternatives to transurethral resection of bladder tumor. Benefits are diagnostic and therapeutic which include removal of bladder tumor and relieve the possible irritative symptoms from bladder tumor. We discussed risk including not being able to remove all of the tumor, possibility of perforation of the bladder requiring long-term catheter or operative intervention to repair. The need for catheter postoperatively was also discussed, as well as postoperative lower urinary tract symptoms in the immediate postop period. Patient voiced their understanding and  would like to proceed with the surgery.      Plan proceed with L URS today. Ucx negative  CC: Beverley Pam, MD   Steffan JAYSON Pea 11/14/2023, 11:56 AM

## 2023-11-15 MED ORDER — GENTAMICIN SULFATE 40 MG/ML IJ SOLN
5.0000 mg/kg | INTRAVENOUS | Status: AC
  Administered 2023-11-16: 410 mg via INTRAVENOUS
  Filled 2023-11-15: qty 10.25

## 2023-11-16 ENCOUNTER — Ambulatory Visit (HOSPITAL_COMMUNITY)

## 2023-11-16 ENCOUNTER — Encounter (HOSPITAL_COMMUNITY): Admission: RE | Disposition: A | Payer: Self-pay | Source: Home / Self Care | Attending: Urology

## 2023-11-16 ENCOUNTER — Ambulatory Visit (HOSPITAL_COMMUNITY): Admitting: Anesthesiology

## 2023-11-16 ENCOUNTER — Ambulatory Visit (HOSPITAL_COMMUNITY)
Admission: RE | Admit: 2023-11-16 | Discharge: 2023-11-16 | Disposition: A | Discharge status: Home / Self Care | Attending: Urology | Admitting: Urology

## 2023-11-16 ENCOUNTER — Other Ambulatory Visit: Payer: Self-pay

## 2023-11-16 ENCOUNTER — Encounter (HOSPITAL_COMMUNITY): Payer: Self-pay | Admitting: Urology

## 2023-11-16 DIAGNOSIS — R7303 Prediabetes: Secondary | ICD-10-CM | POA: Insufficient documentation

## 2023-11-16 DIAGNOSIS — N183 Chronic kidney disease, stage 3 unspecified: Secondary | ICD-10-CM | POA: Diagnosis not present

## 2023-11-16 DIAGNOSIS — M1990 Unspecified osteoarthritis, unspecified site: Secondary | ICD-10-CM | POA: Insufficient documentation

## 2023-11-16 DIAGNOSIS — N202 Calculus of kidney with calculus of ureter: Secondary | ICD-10-CM

## 2023-11-16 DIAGNOSIS — I129 Hypertensive chronic kidney disease with stage 1 through stage 4 chronic kidney disease, or unspecified chronic kidney disease: Secondary | ICD-10-CM | POA: Diagnosis not present

## 2023-11-16 DIAGNOSIS — K219 Gastro-esophageal reflux disease without esophagitis: Secondary | ICD-10-CM | POA: Insufficient documentation

## 2023-11-16 DIAGNOSIS — I700 Atherosclerosis of aorta: Secondary | ICD-10-CM | POA: Diagnosis not present

## 2023-11-16 DIAGNOSIS — N201 Calculus of ureter: Secondary | ICD-10-CM | POA: Diagnosis not present

## 2023-11-16 DIAGNOSIS — I10 Essential (primary) hypertension: Secondary | ICD-10-CM

## 2023-11-16 DIAGNOSIS — N200 Calculus of kidney: Secondary | ICD-10-CM | POA: Diagnosis present

## 2023-11-16 HISTORY — PX: CYSTOSCOPY/URETEROSCOPY/HOLMIUM LASER/STENT PLACEMENT: SHX6546

## 2023-11-16 HISTORY — PX: CYSTOSCOPY W/ RETROGRADES: SHX1426

## 2023-11-16 SURGERY — CYSTOSCOPY/URETEROSCOPY/HOLMIUM LASER/STENT PLACEMENT
Anesthesia: General | Site: Pelvis | Laterality: Left

## 2023-11-16 MED ORDER — AMISULPRIDE (ANTIEMETIC) 5 MG/2ML IV SOLN: 10.0000 mg | Freq: Once | INTRAVENOUS | Status: DC | PRN

## 2023-11-16 MED ORDER — LIDOCAINE HCL (PF) 2 % IJ SOLN
INTRAMUSCULAR | Status: AC
  Filled 2023-11-16: qty 5

## 2023-11-16 MED ORDER — LACTATED RINGERS IV SOLN: INTRAVENOUS | Status: DC

## 2023-11-16 MED ORDER — PHENYLEPHRINE 80 MCG/ML (10ML) SYRINGE FOR IV PUSH (FOR BLOOD PRESSURE SUPPORT)
PREFILLED_SYRINGE | INTRAVENOUS | Status: AC
  Filled 2023-11-16: qty 10

## 2023-11-16 MED ORDER — METHOCARBAMOL 750 MG PO TABS: 750.0000 mg | ORAL_TABLET | Freq: Four times a day (QID) | ORAL | 0 refills | Status: AC

## 2023-11-16 MED ORDER — OXYCODONE HCL 5 MG/5ML PO SOLN: 5.0000 mg | Freq: Once | ORAL | Status: DC | PRN

## 2023-11-16 MED ORDER — TAMSULOSIN HCL 0.4 MG PO CAPS: 0.4000 mg | ORAL_CAPSULE | Freq: Every day | ORAL | 0 refills | Status: AC

## 2023-11-16 MED ORDER — ONDANSETRON HCL 4 MG/2ML IJ SOLN
INTRAMUSCULAR | Status: DC | PRN
  Administered 2023-11-16: 4 mg via INTRAVENOUS

## 2023-11-16 MED ORDER — DEXAMETHASONE SOD PHOSPHATE PF 10 MG/ML IJ SOLN
INTRAMUSCULAR | Status: DC | PRN
  Administered 2023-11-16: 4 mg via INTRAVENOUS

## 2023-11-16 MED ORDER — ACETAMINOPHEN 500 MG PO TABS
1000.0000 mg | ORAL_TABLET | Freq: Once | ORAL | Status: AC
  Administered 2023-11-16: 1000 mg via ORAL
  Filled 2023-11-16: qty 2

## 2023-11-16 MED ORDER — CHLORHEXIDINE GLUCONATE 0.12 % MT SOLN
15.0000 mL | Freq: Once | OROMUCOSAL | Status: AC
  Administered 2023-11-16: 15 mL via OROMUCOSAL

## 2023-11-16 MED ORDER — FENTANYL CITRATE (PF) 100 MCG/2ML IJ SOLN
INTRAMUSCULAR | Status: AC
  Filled 2023-11-16: qty 2

## 2023-11-16 MED ORDER — PHENYLEPHRINE 80 MCG/ML (10ML) SYRINGE FOR IV PUSH (FOR BLOOD PRESSURE SUPPORT)
PREFILLED_SYRINGE | INTRAVENOUS | Status: DC | PRN
  Administered 2023-11-16: 160 ug via INTRAVENOUS
  Administered 2023-11-16: 80 ug via INTRAVENOUS
  Administered 2023-11-16: 120 ug via INTRAVENOUS
  Administered 2023-11-16: 80 ug via INTRAVENOUS
  Administered 2023-11-16: 160 ug via INTRAVENOUS

## 2023-11-16 MED ORDER — LIDOCAINE HCL (CARDIAC) PF 100 MG/5ML IV SOSY
PREFILLED_SYRINGE | INTRAVENOUS | Status: DC | PRN
  Administered 2023-11-16: 100 mg via INTRAVENOUS

## 2023-11-16 MED ORDER — PROPOFOL 10 MG/ML IV BOLUS
INTRAVENOUS | Status: AC
  Filled 2023-11-16: qty 20

## 2023-11-16 MED ORDER — TRAMADOL HCL 50 MG PO TABS: 50.0000 mg | ORAL_TABLET | Freq: Four times a day (QID) | ORAL | 0 refills | Status: AC | PRN

## 2023-11-16 MED ORDER — ORAL CARE MOUTH RINSE
15.0000 mL | Freq: Once | OROMUCOSAL | Status: AC
Start: 2023-11-16 — End: 2023-11-16

## 2023-11-16 MED ORDER — FENTANYL CITRATE (PF) 50 MCG/ML IJ SOSY: 25.0000 ug | PREFILLED_SYRINGE | INTRAMUSCULAR | Status: DC | PRN

## 2023-11-16 MED ORDER — OXYCODONE HCL 5 MG PO TABS: 5.0000 mg | ORAL_TABLET | Freq: Once | ORAL | Status: DC | PRN

## 2023-11-16 MED ORDER — PROPOFOL 10 MG/ML IV BOLUS
INTRAVENOUS | Status: DC | PRN
Start: 2023-11-16 — End: 2023-11-16
  Administered 2023-11-16: 150 mg via INTRAVENOUS

## 2023-11-16 MED ORDER — IOHEXOL 300 MG/ML  SOLN
INTRAMUSCULAR | Status: DC | PRN
  Administered 2023-11-16: 10 mL via URETHRAL

## 2023-11-16 MED ORDER — SODIUM CHLORIDE 0.9 % IR SOLN
Status: DC | PRN
  Administered 2023-11-16: 3000 mL via INTRAVESICAL

## 2023-11-16 MED ORDER — ONDANSETRON HCL 4 MG/2ML IJ SOLN
INTRAMUSCULAR | Status: AC
  Filled 2023-11-16: qty 2

## 2023-11-16 MED ORDER — FENTANYL CITRATE (PF) 100 MCG/2ML IJ SOLN
INTRAMUSCULAR | Status: DC | PRN
  Administered 2023-11-16: 50 ug via INTRAVENOUS

## 2023-11-16 SURGICAL SUPPLY — 23 items
BAG COUNTER SPONGE SURGICOUNT (BAG) IMPLANT
BAG URO CATCHER STRL LF (MISCELLANEOUS) ×2 IMPLANT
BASKET ZERO TIP NITINOL 2.4FR (BASKET) IMPLANT
CATH URETERAL DUAL LUMEN 10F (MISCELLANEOUS) IMPLANT
CATH URETL OPEN 5X70 (CATHETERS) IMPLANT
CLOTH BEACON ORANGE TIMEOUT ST (SAFETY) ×2 IMPLANT
EXTRACTOR STONE 1.7FRX115CM (UROLOGICAL SUPPLIES) IMPLANT
FIBER LASER MOSES 200 DFL (Laser) IMPLANT
FIBER LASER MOSES 365 DFL (Laser) IMPLANT
GLOVE BIO SURGEON STRL SZ8 (GLOVE) ×2 IMPLANT
GOWN STRL REUS W/ TWL XL LVL3 (GOWN DISPOSABLE) ×2 IMPLANT
GUIDEWIRE ANG ZIPWIRE 038X150 (WIRE) IMPLANT
GUIDEWIRE STR DUAL SENSOR (WIRE) ×2 IMPLANT
KIT TURNOVER KIT A (KITS) ×2 IMPLANT
MANIFOLD NEPTUNE II (INSTRUMENTS) ×2 IMPLANT
NS IRRIG 1000ML POUR BTL (IV SOLUTION) IMPLANT
PACK CYSTO (CUSTOM PROCEDURE TRAY) ×2 IMPLANT
SHEATH NAVIGATOR HD 11/13X28 (SHEATH) IMPLANT
SHEATH NAVIGATOR HD 11/13X36 (SHEATH) ×2 IMPLANT
STENT URET 6FRX26 CONTOUR (STENTS) IMPLANT
TRACTIP FLEXIVA PULS ID 200XHI (Laser) IMPLANT
TUBING CONNECTING 10 (TUBING) ×2 IMPLANT
TUBING UROLOGY SET (TUBING) ×2 IMPLANT

## 2023-11-16 NOTE — Op Note (Signed)
 Preoperative diagnosis: left ureteral calculus  Postoperative diagnosis: left ureteral calculus  Procedure:  Cystoscopy left ureteroscopy and stone removal Ureteroscopic laser lithotripsy left 60F x 26 ureteral stent placement no strings left retrograde pyelography with interpretation  Surgeon: Dr. Steffan Pea  Anesthesia: General  Complications: None  Intraoperative findings:  Very large median lobe on cystoscopy Distal ureteral narrowing scope For left distal ureter posterior compartment Left renal pelvis stone fragmented and basketed.  Decision was made to remove stones in the distal ureter likely related DUSTED. 6x26 double-J stent placed in the left ureter without strings  Left retrograde pyelogram interpretation: No filling defects within the renal pelvis Contrast extravasation no narrowed infandibulum   EBL: Minimal  Specimens: left ureteral calculus  Disposition of specimens: [facility] for stone analysis  Indication: JOHATHAN PROVINCE is a 78 y.o.   patient with a with a long history of asymptomatic left distal ureteral stone that has been present for many months primary percutaneous ureteroscopy attempted ureter was too narrow unable to access extensive stent was placed ureteroscope about left ureteral stone renal pelvis proximal to the UPJ, after reviewing the management options for treatment, the patient elected to proceed with the above surgical procedure(s). We have discussed the potential benefits and risks of the procedure, side effects of the proposed treatment, the likelihood of the patient achieving the goals of the procedure, and any potential problems that might occur during the procedure or recuperation. Informed consent has been obtained.   Description of procedure:  The patient was taken to the operating room and general anesthesia was induced.  The patient was placed in the dorsal lithotomy position, prepped and draped in the usual  sterile fashion, and preoperative antibiotics were administered. A preoperative time-out was performed.   Cystourethroscopy was performed.  The patient's urethra was examined and was normal.  A large median lobe. Pan cystoscopy was performed and the bladder systematically examined in its entirety. There was no evidence for any bladder tumors, stones, or other mucosal pathology.  Median lobe was noted to be large. Left stent protruding from the Left ureteral orifice.   Attention then turned to the left ureteral orifice where a wire was attempted to be placed into the sheath and placed adjacent to the left ureteral stent.  Precipitins at this the stent was then grasped and pulled to the meatus.  A wire was placed through the stent into the left collecting system proper placement was confirmed with fluoroscopy.  The 4.5 Fr semirigid ureteroscope was then advanced into the ureter next to the guidewire and the calculus was identified.  The distal ureter was noted to have narrowing that made accessing the stone challenging.    The stone was then fragmented with the 200 micron holmium laser fiber on a setting of 0.6 and frequency of 6 Hz.   All stones were then removed from the ureter with an 0 tip basket.  Reinspection of the ureter revealed no remaining visible stones or fragments.   Scope was then driven into the renal pelvis retrograde proximal to the stone points demonstrating the findings as noted above.    Second sensor wire was then placed in the collecting system.    an 11-13 French 36 cm access sheath placed over the wires placed for patient tolerated flexible ureteroscope was advanced into the collecting system.  Was identified immediately change.  The patient is nearly the distal ureter resulting passable fragments.  Stone was fragmented using laser settings of 1 J and  6 Hz.  Once the stone was fragmented appropriately all stone fragments were basketed out.  Inspection of the pelvis  demonstrated residual stones no tumors or other forms.  Stent scope removed visualize the ureter or renal or no injury to the ureter or other abnormalities.   The wire was then backloaded through the cystoscope and a ureteral stent was advance over the wire using Seldinger technique.  The stent was positioned appropriately under fluoroscopic and cystoscopic guidance.  The wire was then removed with an adequate stent curl noted in the renal pelvis as well as in the bladder.  The bladder was then emptied and the procedure ended.  The patient appeared to tolerate the procedure well and without complications.  The patient was able to be awakened and transferred to the recovery unit in satisfactory condition.   Disposition: The patient has been scheduled for followup in 6 weeks with a renal ultrasound.  The patient will be scheduled for stent removal in 2 weeks in our clinic.

## 2023-11-16 NOTE — Anesthesia Postprocedure Evaluation (Signed)
 Anesthesia Post Note  Patient: Jeffrey Carey  Procedure(s) Performed: CYSTOSCOPY/URETEROSCOPY/HOLMIUM LASER/STENT PLACEMENT (Left: Pelvis) CYSTOSCOPY, WITH RETROGRADE PYELOGRAM (Left: Pelvis)     Patient location during evaluation: PACU Anesthesia Type: General Level of consciousness: awake and alert Pain management: pain level controlled Vital Signs Assessment: post-procedure vital signs reviewed and stable Respiratory status: spontaneous breathing, nonlabored ventilation, respiratory function stable and patient connected to nasal cannula oxygen Cardiovascular status: blood pressure returned to baseline and stable Postop Assessment: no apparent nausea or vomiting Anesthetic complications: no   No notable events documented.  Last Vitals:  Vitals:   11/16/23 1255 11/16/23 1303  BP: 133/69 (!) 146/88  Pulse: (!) 56 (!) 54  Resp: 16 18  Temp: (!) 36.4 C   SpO2: 96% 96%    Last Pain:  Vitals:   11/16/23 1303  TempSrc:   PainSc: 0-No pain                 Dayquan Buys L Helaman [place]

## 2023-11-16 NOTE — Anesthesia Procedure Notes (Signed)
 Procedure Name: LMA Insertion Date/Time: 11/16/2023 11:10 AM  Performed by: Gladis Honey, CRNAPre-anesthesia Checklist: Patient identified, Emergency Drugs available, Suction available and Patient being monitored Patient Re-evaluated:Patient Re-evaluated prior to induction Oxygen Delivery Method: Circle System Utilized Preoxygenation: Pre-oxygenation with 100% oxygen Induction Type: IV induction Ventilation: Mask ventilation without difficulty LMA: LMA inserted LMA Size: 4.0 Number of attempts: 1 Airway Equipment and Method: Bite block Placement Confirmation: positive ETCO2 Tube secured with: Tape Dental Injury: Teeth and Oropharynx as per pre-operative assessment

## 2023-11-16 NOTE — Progress Notes (Signed)
------------------------------------------  [facility] PROCEDURAL EXPEDITER NOTE-------------------------------------------------  Patient Name: Jeffrey Carey Patient DOB: [birth year] Today's Date: @TODAY @   Chart reviewed:  Yes  Documentation gaps: n/a Orders in place:  Yes  Communication with surgical team if no orders: n/a Labs, test, and orders reviewed: yes Requires surgical clearance:  No What type of clearance: n/a Clearance received: n/a Patient status:n/a   Barriers noted:n/a  Intervention provided by [facility] team: none Barrier resolved:  Yes   Ronal Bald, RN [employer]

## 2023-11-16 NOTE — Anesthesia Preprocedure Evaluation (Addendum)
 Anesthesia Evaluation  Patient identified by MRN, date of birth, ID band Patient awake    Reviewed: Allergy & Precautions, NPO status , Patient's Chart, lab work & pertinent test results, reviewed documented beta blocker date and time   Airway Mallampati: II  TM Distance: >3 FB Neck ROM: Full    Dental  (+) Teeth Intact, Dental Advisory Given   Pulmonary neg pulmonary ROS   Pulmonary exam normal breath sounds clear to auscultation       Cardiovascular hypertension, Pt. on medications and Pt. on home beta blockers Normal cardiovascular exam Rhythm:Regular Rate:Normal  TTE 2025 1. Left ventricular ejection fraction, by estimation, is 55 to 60%. The  left ventricle has normal function. Left ventricular endocardial border  not optimally defined to evaluate regional wall motion. There is mild left  ventricular hypertrophy of the  basal-septal segment. Left ventricular diastolic parameters are consistent  with Grade I diastolic dysfunction (impaired relaxation).   2. Right ventricular systolic function is normal. The right ventricular  size is normal. Tricuspid regurgitation signal is inadequate for assessing  PA pressure.   3. The mitral valve is normal in structure. No evidence of mitral valve  regurgitation. No evidence of mitral stenosis.   4. The aortic valve is tricuspid. Aortic valve regurgitation is not  visualized. Aortic valve sclerosis/calcification is present, without any  evidence of aortic stenosis. Aortic valve area, by VTI measures 2.19 cm.  Aortic valve mean gradient measures  2.0 mmHg. Aortic valve Vmax measures 1.02 m/s.     Neuro/Psych negative neurological ROS  negative psych ROS   GI/Hepatic Neg liver ROS,GERD  ,,  Endo/Other  negative endocrine ROS  prediabetes  Renal/GU Renal InsufficiencyRenal disease  negative genitourinary   Musculoskeletal  (+) Arthritis ,    Abdominal   Peds   Hematology negative hematology ROS (+)   Anesthesia Other Findings   Reproductive/Obstetrics                              Anesthesia Physical Anesthesia Plan  ASA: 2  Anesthesia Plan: General   Post-op Pain Management: Tylenol  PO (pre-op)*   Induction: Intravenous  PONV Risk Score and Plan: 2 and Ondansetron , Dexamethasone  and Treatment may vary due to age or medical condition  Airway Management Planned: LMA  Additional Equipment:   Intra-op Plan:   Post-operative Plan: Extubation in OR  Informed Consent: I have reviewed the patients History and Physical, chart, labs and discussed the procedure including the risks, benefits and alternatives for the proposed anesthesia with the patient or authorized representative who has indicated his/her understanding and acceptance.     Dental advisory given  Plan Discussed with: CRNA  Anesthesia Plan Comments:          Anesthesia Quick Evaluation

## 2023-11-16 NOTE — Transfer of Care (Signed)
 Immediate Anesthesia Transfer of Care Note  Patient: Jeffrey Carey  Procedure(s) Performed: CYSTOSCOPY/URETEROSCOPY/HOLMIUM LASER/STENT PLACEMENT (Left: Pelvis) CYSTOSCOPY, WITH RETROGRADE PYELOGRAM (Left: Pelvis)  Patient Location: PACU  Anesthesia Type:General  Level of Consciousness: awake, alert , and oriented  Airway & Oxygen Therapy: Patient Spontanous Breathing and Patient connected to face mask oxygen  Post-op Assessment: Report given to RN and Post -op Vital signs reviewed and stable  Post vital signs: Reviewed and stable  Last Vitals:  Vitals Value Taken Time  BP 144/80 11/16/23 12:17  Temp 97.3   Pulse 50 11/16/23 12:20  Resp 18 11/16/23 12:20  SpO2 100 % 11/16/23 12:20  Vitals shown include unfiled device data.  Last Pain:  Vitals:   11/16/23 0845  TempSrc:   PainSc: 0-No pain         Complications: No notable events documented.

## 2023-11-16 NOTE — Discharge Instructions (Signed)
 DISCHARGE INSTRUCTIONS FOR Ureteroscopy and/or Ureteral Stent Placement  MEDICATIONS:  1.  Robaxin  2. Tamsulosin   3. Hyoscyamine    ACTIVITY:  1. No strenuous activity x 1week  2. No driving while on narcotic pain medications  3. Drink plenty of water   4. Continue to walk at home - it is normal to see blood in the urine while the stent is in place, so keep active, but don't over do it.  5. May return to work/school tomorrow or when you feel ready  6. You may experience some pain when urinating in the kidney on the side that was operated on while the stent is in place this is normal  WHAT IS NORMAL TO EXPERIENCE: It is normal to feel the urge to urinate while the stent is in place It is normal to have blood in your urine while the stent is in place  It sometimes can be normal to have pain in your kidney when you urinate   BATHING:  1. You can shower and we recommend daily showers    DIET: You may return to your normal diet immediately. Because of the raw surface of your bladder, alcohol, spicy foods, acid type foods and drinks with caffeine may cause irritation or frequency and should be used in moderation. To keep your urine flowing freely and to avoid constipation, drink plenty of fluids during the day ( 8-10 glasses ). Tip: Avoid cranberry juice because it is very acidic.  SIGNS/SYMPTOMS TO CALL:  Please call us  if you have a fever greater than 101.5, uncontrolled nausea/vomiting, uncontrolled pain, dizziness, unable to urinate, bloody urine with clots greater than the size of a quarter, chest pain, shortness of breath, leg swelling, leg pain, redness around wound, drainage from wound, or any other concerns or questions.   You can reach us  at [phone].   FOLLOW-UP:  1. You you have been set up for f/u in 6-8 weeks  2. You will have your stent removed in clinic in 2 weeks

## 2023-11-17 ENCOUNTER — Encounter (HOSPITAL_COMMUNITY): Payer: Self-pay | Admitting: Urology

## 2023-11-27 LAB — STONE ANALYSIS
Calcium Oxalate Dihydrate: 10 %
Calcium Oxalate Monohydrate: 90 %
Weight Calculi: 129 mg

## 2023-12-18 ENCOUNTER — Encounter: Payer: Self-pay | Admitting: Emergency Medicine

## 2023-12-18 ENCOUNTER — Ambulatory Visit (INDEPENDENT_AMBULATORY_CARE_PROVIDER_SITE_OTHER): Admitting: Emergency Medicine

## 2023-12-18 ENCOUNTER — Ambulatory Visit: Payer: Self-pay

## 2023-12-18 VITALS — BP 134/86 | HR 55 | Resp 16 | Ht 69.5 in | Wt 214.0 lb

## 2023-12-18 DIAGNOSIS — N390 Urinary tract infection, site not specified: Secondary | ICD-10-CM

## 2023-12-18 LAB — POCT URINALYSIS DIPSTICK
Bilirubin, UA: NEGATIVE
Blood, UA: POSITIVE
Glucose, UA: NEGATIVE
Ketones, UA: NEGATIVE
Nitrite, UA: NEGATIVE
Protein, UA: NEGATIVE
Spec Grav, UA: 1.02 (ref 1.010–1.025)
Urobilinogen, UA: 0.2 U/dL
pH, UA: 5 (ref 5.0–8.0)

## 2023-12-18 MED ORDER — SULFAMETHOXAZOLE-TRIMETHOPRIM 800-160 MG PO TABS: 1.0000 | ORAL_TABLET | Freq: Two times a day (BID) | ORAL | 0 refills | Status: DC

## 2023-12-18 NOTE — Progress Notes (Signed)
 Assessment & Plan:   Assessment & Plan Acute UTI Urinalysis confirmed UTI with pyuria and hematuria. Bactrim  chosen due to cephalosporin allergy and adequate kidney function (GFR consistently >30 and checked in the last 60 days). Avoided ciprofloxacin  due to age-related risks.  Mild left suprapubic pain, started after UTI sx. Doubt acute intra-abdominal infection aside from UTI at this time but recommend re-evaluation/additional workup for worsening or failure to improve as anticipated - Prescribed Bactrim  twice daily. - Sent urine for culture. - Advised to monitor for increased abdominal pain, fever, or vomiting and go to ER if these occur. - Recommended follow-up with urology.  Chronic kidney disease Slightly decreased kidney function monitored by nephrologist. - Continue monitoring kidney function with nephrologist. Orders:   POCT urinalysis dipstick   Urine Culture   sulfamethoxazole -trimethoprim  (BACTRIM  DS) 800-160 MG tablet; Take 1 tablet by mouth 2 (two) times daily for 7 days.    Patient Instructions  We are treating for a UTI with an antibiotic Make sure to really stay hydrated while taking this.  If you get any increased abdominal pain, a CT scan should be done If you get get severe abdominal pain, fevers, or vomiting- go to the ER foe urgent testing Follow-up with urology is recommended   Corean Geralds, MSPAS, PA-C    Subjective:   Chief Complaint  Patient presents with   Dysuria    Dysuria and urinary frequency, lack of appetite and fatigue x 6 days    HPI:  Discussed the use of AI scribe software for clinical note transcription with the patient, who gave verbal consent to proceed.  History of Present Illness Jeffrey Carey is a 78 year old male with a history of kidney stones and enlarged prostate who presents with urinary symptoms.  He has had nocturia, dysuria, painful urination with only small volumes, and a sensation of incomplete bladder  emptying for six days after stent removal on December 1st.  He had cystoscopy with stent placement for kidney stones on October 28th and November 13th, with stent removal on December 1st after the urologist reported the stones were removed. He has no prior urinary tract infections.  He has had low appetite and low energy, with chills on Wednesday and feeling generally unwell, but no fever, nausea, vomiting, back pain, groin pain, or pain with bowel movements.  He has an enlarged prostate noted by urology and elevated prostate labs eight months ago. He denies prior prostate infections.  He takes atenolol , pravastatin , Flomax , and vitamins. He is allergic to cefdinir, which caused hives.  He has diverticulosis on prior CT but denies history of diverticulitis.     ROS: Negative unless specifically indicated above in HPI.   Relevant past medical history reviewed and updated as indicated.  Past Medical History:  Diagnosis Date   Arthritis    Chronic kidney disease    Dyslipidemia    Dysrhythmia    Bradycardia   ED (erectile dysfunction)    GERD (gastroesophageal reflux disease)    Grade I diastolic dysfunction    Per ECHO done 04-14-23   History of kidney stones    Hyperlipidemia    Hypertension    Pre-diabetes    Radiculopathy 04/30/2014   Vitamin D  deficiency     Allergies and medications reviewed and updated.  Current Medications[1]  Allergies[2]  Social History[3]   Objective:   Vitals:   12/18/23 1100  BP: 134/86  Pulse: (!) 55  Resp: 16  Height: 5' 9.5 (1.765  m)  Weight: 214 lb (97.1 kg)  SpO2: 94%  BMI (Calculated): 31.16     Appears well, INAD Sclera anicteric Oral mucosa moist Heart slight bradycardia regular rhythm Normal resp effort and excursion. CTAB Abdomen soft, mild left suprapubic tenderness without guarding or rebound, good bowel sounds, no CVAT     [1]  Current Outpatient Medications:    acetaminophen  (TYLENOL ) 325 MG tablet, Take  650 mg by mouth every 6 (six) hours as needed for moderate pain (pain score 4-6) or headache., Disp: , Rfl:    aspirin 81 MG tablet, Take 81 mg by mouth at bedtime. , Disp: , Rfl:    atenolol  (TENORMIN ) 100 MG tablet, TAKE 1 TABLET BY MOUTH EVERY DAY FOR BLOOD PRESSURE, Disp: 90 tablet, Rfl: 3   CINNAMON PO, Take 1,000 mg by mouth daily., Disp: , Rfl:    Flaxseed, Linseed, (FLAXSEED OIL PO), Take 1 capsule by mouth daily., Disp: , Rfl:    fluticasone  (FLONASE ) 50 MCG/ACT nasal spray, Place 2 sprays into both nostrils daily as needed for allergies or rhinitis., Disp: , Rfl:    Magnesium  250 MG TABS, Take 250 mg by mouth daily., Disp: , Rfl:    Omega-3 Fatty Acids (FISH OIL PO), Take 1,200 mg by mouth at bedtime. , Disp: , Rfl:    polyethylene glycol powder (GLYCOLAX /MIRALAX ) 17 GM/SCOOP powder, Take 17 g by mouth 2 (two) times daily as needed for mild constipation., Disp: 500 g, Rfl: 0   pravastatin  (PRAVACHOL ) 40 MG tablet, TAKE 1 TABLET BY MOUTH EVERY DAY AT BEDTIME FOR CHOLESTEROL, Disp: 90 tablet, Rfl: 3   sulfamethoxazole -trimethoprim  (BACTRIM  DS) 800-160 MG tablet, Take 1 tablet by mouth 2 (two) times daily for 7 days., Disp: 14 tablet, Rfl: 0   tamsulosin  (FLOMAX ) 0.4 MG CAPS capsule, Take 1 capsule (0.4 mg total) by mouth daily after supper., Disp: 30 capsule, Rfl: 0   tamsulosin  (FLOMAX ) 0.4 MG CAPS capsule, Take 1 capsule (0.4 mg total) by mouth daily after supper., Disp: 30 capsule, Rfl: 0   traMADol  (ULTRAM ) 50 MG tablet, Take 1 tablet (50 mg total) by mouth every 6 (six) hours as needed for up to 8 doses., Disp: 8 tablet, Rfl: 0   VITAMIN D  PO, Take 5,000 Units by mouth daily., Disp: , Rfl:    Simethicone  125 MG CAPS, 125 to 250 mg orally as needed after meals and at bedtime for bloating (Patient not taking: Reported on 12/18/2023), Disp: 28 capsule, Rfl: 0   traMADol  (ULTRAM ) 50 MG tablet, Take 1 tablet (50 mg total) by mouth every 6 (six) hours as needed for up to 5 doses (use only if  in significatn pain). (Patient not taking: Reported on 12/18/2023), Disp: 5 tablet, Rfl: 0 [2]  Allergies Allergen Reactions   Ceftin [Cefuroxime Axetil] Hives   Peanut-Containing Drug Products Rash  [3]  Social History Tobacco Use   Smoking status: Never   Smokeless tobacco: Never  Vaping Use   Vaping status: Never Used  Substance Use Topics   Alcohol use: No   Drug use: No

## 2023-12-18 NOTE — Telephone Encounter (Signed)
°  FYI Only or Action Required?: Action required by provider: update on patient condition.  Patient was last seen in primary care on 12/18/2023 by Waddell Krabbe, PA-C.  Called Nurse Triage reporting Hematuria.  Symptoms began today.  Interventions attempted: Other: seen at PCP today.  Antibiotics given.  Symptoms are: unchanged.  Triage Disposition: See Physician Within 24 Hours  Patient/caregiver understands and will follow disposition?: Yes  Copied from CRM #[id number]. Topic: Clinical - Red Word Triage >> Dec 18, 2023  2:49 PM Berneda FALCON wrote: Red Word that prompted transfer to Nurse Triage: Daughter Clotilda calling and states the patient was just seen today for UTI and when he went home, he found blood in his urine. He has a history of kidney stones. He has not picked up his medication yet.  Patient has been in and out of the hospital 2x in the last month for kidney stones. Reason for Disposition  Blood in urine  (Exception: Could be normal menstrual bleeding.)  Answer Assessment - Initial Assessment Questions Patient's daughter is caller:  Patient seen today in office and diagnosed with UTI Later found blood in urine at home.   Reassured that blood was present in urine at office.     Per office visit note: Advised to monitor for increased abdominal pain, fever, or vomiting and go to ER if these occur.  Patient's daughter verbalizes understanding  Protocols used: Urine - Blood In-A-AH

## 2023-12-18 NOTE — Patient Instructions (Addendum)
 We are treating for a UTI with an antibiotic Make sure to really stay hydrated while taking this.  If you get any increased abdominal pain, a CT scan should be done If you get get severe abdominal pain, fevers, or vomiting- go to the ER foe urgent testing Follow-up with urology is recommended

## 2023-12-19 NOTE — Telephone Encounter (Signed)
 Contacted patient to see if he is still experiencing any of the same symptoms. Patient state he is feeling better today and does not see any blood in his urine. Offered patient another appointment and he denied needing to come back and states he will continue on the medication.

## 2023-12-20 ENCOUNTER — Other Ambulatory Visit: Payer: Self-pay | Admitting: Family Medicine

## 2023-12-20 ENCOUNTER — Ambulatory Visit: Payer: Self-pay | Admitting: Emergency Medicine

## 2023-12-20 LAB — URINE CULTURE
MICRO NUMBER:: 17356191
SPECIMEN QUALITY:: ADEQUATE

## 2023-12-20 MED ORDER — AMOXICILLIN 500 MG PO CAPS: 1000.0000 mg | ORAL_CAPSULE | Freq: Three times a day (TID) | ORAL | 0 refills | Status: AC

## 2023-12-20 NOTE — Telephone Encounter (Signed)
 I called patient to discuss his results and concern that bactrim  will be ineffective. He confirmed that UTI symptoms have not improved, but has not developed new red flag symptoms.  Discussed with his PCP, Dr Sebastian Has tolerated PCN IM before despite ceftin allergy with hives. Rx amoxil  high dose, with recommendation that he contact his urology office tomorrow am for definitive treatment timeline (14d rx sent to pharmacy).  Otherwise, no change in recommendations for symptom monitoring

## 2024-09-30 ENCOUNTER — Ambulatory Visit
